# Patient Record
Sex: Female | Born: 1950
Health system: Southern US, Community
[De-identification: ages and names within clinical notes are randomized; demographics above are authoritative.]

## PROBLEM LIST (undated history)

## (undated) DIAGNOSIS — M1990 Unspecified osteoarthritis, unspecified site: Secondary | ICD-10-CM

## (undated) DIAGNOSIS — E785 Hyperlipidemia, unspecified: Secondary | ICD-10-CM

## (undated) DIAGNOSIS — H269 Unspecified cataract: Secondary | ICD-10-CM

## (undated) DIAGNOSIS — H409 Unspecified glaucoma: Secondary | ICD-10-CM

## (undated) DIAGNOSIS — I10 Essential (primary) hypertension: Secondary | ICD-10-CM

## (undated) HISTORY — DX: Unspecified cataract: H26.9

## (undated) HISTORY — PX: EYE SURGERY: SHX253

## (undated) HISTORY — DX: Essential (primary) hypertension: I10

## (undated) HISTORY — DX: Hyperlipidemia, unspecified: E78.5

## (undated) HISTORY — PX: SHOULDER SURGERY: SHX246

---

## 2005-05-07 ENCOUNTER — Ambulatory Visit: Payer: Self-pay | Admitting: Ophthalmology

## 2014-01-31 DIAGNOSIS — H409 Unspecified glaucoma: Secondary | ICD-10-CM | POA: Insufficient documentation

## 2014-04-04 ENCOUNTER — Ambulatory Visit: Payer: Self-pay | Admitting: Unknown Physician Specialty

## 2015-01-17 DIAGNOSIS — M19012 Primary osteoarthritis, left shoulder: Secondary | ICD-10-CM | POA: Insufficient documentation

## 2015-01-18 ENCOUNTER — Other Ambulatory Visit: Payer: Self-pay | Admitting: Surgery

## 2015-01-18 DIAGNOSIS — M19012 Primary osteoarthritis, left shoulder: Secondary | ICD-10-CM

## 2015-01-18 DIAGNOSIS — M7582 Other shoulder lesions, left shoulder: Secondary | ICD-10-CM

## 2015-01-22 ENCOUNTER — Ambulatory Visit: Payer: Self-pay

## 2015-01-23 ENCOUNTER — Ambulatory Visit
Admission: RE | Admit: 2015-01-23 | Discharge: 2015-01-23 | Disposition: A | Discharge status: Home / Self Care | Payer: BC Managed Care – PPO | Source: Ambulatory Visit | Attending: Surgery | Admitting: Surgery

## 2015-01-23 DIAGNOSIS — S46812A Strain of other muscles, fascia and tendons at shoulder and upper arm level, left arm, initial encounter: Secondary | ICD-10-CM | POA: Insufficient documentation

## 2015-01-23 DIAGNOSIS — S43402A Unspecified sprain of left shoulder joint, initial encounter: Secondary | ICD-10-CM | POA: Insufficient documentation

## 2015-01-23 DIAGNOSIS — M12812 Other specific arthropathies, not elsewhere classified, left shoulder: Secondary | ICD-10-CM | POA: Diagnosis not present

## 2015-01-23 DIAGNOSIS — M7582 Other shoulder lesions, left shoulder: Secondary | ICD-10-CM

## 2015-01-23 DIAGNOSIS — M19012 Primary osteoarthritis, left shoulder: Secondary | ICD-10-CM | POA: Diagnosis present

## 2015-01-23 DIAGNOSIS — M659 Synovitis and tenosynovitis, unspecified: Secondary | ICD-10-CM | POA: Insufficient documentation

## 2015-05-01 ENCOUNTER — Other Ambulatory Visit: Payer: BC Managed Care – PPO

## 2015-05-08 ENCOUNTER — Inpatient Hospital Stay: Admission: RE | Admit: 2015-05-08 | Payer: BC Managed Care – PPO | Source: Ambulatory Visit | Admitting: Surgery

## 2015-05-08 ENCOUNTER — Encounter: Admission: RE | Payer: Self-pay | Source: Ambulatory Visit

## 2015-05-08 SURGERY — ARTHROPLASTY, SHOULDER, TOTAL
Anesthesia: Choice | Laterality: Left

## 2015-07-12 ENCOUNTER — Other Ambulatory Visit: Payer: Self-pay | Admitting: Orthopedic Surgery

## 2015-07-12 DIAGNOSIS — M25562 Pain in left knee: Secondary | ICD-10-CM

## 2015-07-12 DIAGNOSIS — M2392 Unspecified internal derangement of left knee: Secondary | ICD-10-CM

## 2015-07-27 ENCOUNTER — Ambulatory Visit
Admission: RE | Admit: 2015-07-27 | Discharge: 2015-07-27 | Disposition: A | Discharge status: Home / Self Care | Payer: BC Managed Care – PPO | Source: Ambulatory Visit | Attending: Orthopedic Surgery | Admitting: Orthopedic Surgery

## 2015-07-27 DIAGNOSIS — M25562 Pain in left knee: Secondary | ICD-10-CM | POA: Diagnosis present

## 2015-07-27 DIAGNOSIS — M7122 Synovial cyst of popliteal space [Baker], left knee: Secondary | ICD-10-CM | POA: Diagnosis not present

## 2015-07-27 DIAGNOSIS — M2392 Unspecified internal derangement of left knee: Secondary | ICD-10-CM

## 2015-09-24 ENCOUNTER — Ambulatory Visit
Admission: RE | Admit: 2015-09-24 | Discharge: 2015-09-24 | Disposition: A | Discharge status: Home / Self Care | Payer: BC Managed Care – PPO | Source: Ambulatory Visit | Attending: Unknown Physician Specialty | Admitting: Unknown Physician Specialty

## 2015-09-24 ENCOUNTER — Other Ambulatory Visit: Payer: Self-pay | Admitting: Unknown Physician Specialty

## 2015-09-24 DIAGNOSIS — M7989 Other specified soft tissue disorders: Secondary | ICD-10-CM | POA: Insufficient documentation

## 2015-09-24 DIAGNOSIS — I82402 Acute embolism and thrombosis of unspecified deep veins of left lower extremity: Secondary | ICD-10-CM

## 2015-09-24 DIAGNOSIS — M7122 Synovial cyst of popliteal space [Baker], left knee: Secondary | ICD-10-CM | POA: Diagnosis not present

## 2015-09-25 ENCOUNTER — Ambulatory Visit
Admission: RE | Admit: 2015-09-25 | Discharge: 2015-09-25 | Disposition: A | Discharge status: Home / Self Care | Payer: BC Managed Care – PPO | Source: Ambulatory Visit | Attending: Unknown Physician Specialty | Admitting: Unknown Physician Specialty

## 2015-09-25 ENCOUNTER — Other Ambulatory Visit: Payer: Self-pay | Admitting: Unknown Physician Specialty

## 2015-09-25 DIAGNOSIS — M7122 Synovial cyst of popliteal space [Baker], left knee: Secondary | ICD-10-CM | POA: Diagnosis not present

## 2015-09-25 DIAGNOSIS — R52 Pain, unspecified: Secondary | ICD-10-CM

## 2015-09-25 DIAGNOSIS — M25462 Effusion, left knee: Secondary | ICD-10-CM | POA: Diagnosis not present

## 2015-09-25 DIAGNOSIS — M7989 Other specified soft tissue disorders: Secondary | ICD-10-CM | POA: Insufficient documentation

## 2015-09-25 DIAGNOSIS — M79662 Pain in left lower leg: Secondary | ICD-10-CM | POA: Insufficient documentation

## 2015-09-25 LAB — POCT I-STAT CREATININE: Creatinine, Ser: 0.8 mg/dL (ref 0.44–1.00)

## 2015-09-25 MED ORDER — GADOBENATE DIMEGLUMINE 529 MG/ML IV SOLN
20.0000 mL | Freq: Once | INTRAVENOUS | Status: AC | PRN
  Administered 2015-09-25: 17 mL via INTRAVENOUS

## 2015-11-29 ENCOUNTER — Other Ambulatory Visit: Payer: Self-pay | Admitting: Unknown Physician Specialty

## 2015-11-29 DIAGNOSIS — M79662 Pain in left lower leg: Secondary | ICD-10-CM

## 2015-11-29 DIAGNOSIS — M7122 Synovial cyst of popliteal space [Baker], left knee: Secondary | ICD-10-CM

## 2015-12-11 ENCOUNTER — Other Ambulatory Visit: Payer: Self-pay | Admitting: General Surgery

## 2015-12-13 ENCOUNTER — Ambulatory Visit
Admission: RE | Admit: 2015-12-13 | Discharge: 2015-12-13 | Disposition: A | Discharge status: Home / Self Care | Payer: BC Managed Care – PPO | Source: Ambulatory Visit | Attending: Unknown Physician Specialty | Admitting: Unknown Physician Specialty

## 2015-12-13 DIAGNOSIS — M79662 Pain in left lower leg: Secondary | ICD-10-CM

## 2015-12-13 DIAGNOSIS — M7122 Synovial cyst of popliteal space [Baker], left knee: Secondary | ICD-10-CM

## 2015-12-13 MED ORDER — METHYLPREDNISOLONE ACETATE 40 MG/ML IJ SUSP
80.0000 mg | Freq: Once | INTRAMUSCULAR | Status: DC
  Filled 2015-12-13: qty 2

## 2015-12-13 MED ORDER — BUPIVACAINE HCL 0.5 % IJ SOLN
50.0000 mL | Freq: Once | INTRAMUSCULAR | Status: DC
  Filled 2015-12-13: qty 50

## 2015-12-13 MED ORDER — SODIUM CHLORIDE 0.9 % IV SOLN: INTRAVENOUS | Status: DC

## 2015-12-13 NOTE — Discharge Instructions (Signed)
Baker Cyst °A Baker cyst is a sac-like structure that forms in the back of the knee. It is filled with the same fluid that is located in your knee. This fluid lubricates the bones and cartilage of the knee and allows them to move over each other more easily. °CAUSES  °When the knee becomes injured or inflamed, increased fluid forms in the knee. When this happens, the joint lining is pushed out behind the knee and forms the Baker cyst. This cyst may also be caused by inflammation from arthritic conditions and infections. °SIGNS AND SYMPTOMS  °A Baker cyst usually has no symptoms. When the cyst is substantially enlarged: °· You may feel pressure behind the knee, stiffness in the knee, or a mass in the area behind the knee. °· You may develop pain, redness, and swelling in the calf.  This can suggest a blood clot and requires evaluation by your health care provider. °DIAGNOSIS  °A Baker cyst is most often found during an ultrasound exam. This exam may have been performed for other reasons, and the cyst was found incidentally. Sometimes an MRI is used. This picks up other problems within a joint that an ultrasound exam may not. If the Baker cyst developed immediately after an injury, X-ray exams may be used to diagnose the cyst. °TREATMENT  °The treatment depends on the cause of the cyst. Anti-inflammatory medicines and rest often will be prescribed. If the cyst is caused by a bacterial infection, antibiotic medicines may be prescribed.  °HOME CARE INSTRUCTIONS  °· If the cyst was caused by an injury, for the first 24 hours, keep the injured leg elevated on 2 pillows while lying down. °· For the first 24 hours while you are awake, apply ice to the injured area: °¨ Put ice in a plastic bag. °¨ Place a towel between your skin and the bag. °¨ Leave the ice on for 20 minutes, 2-3 times a day. °· Only take over-the-counter or prescription medicines for pain, discomfort, or fever as directed by your health care  provider. °· Only take antibiotic medicine as directed. Make sure to finish it even if you start to feel better. °MAKE SURE YOU:  °· Understand these instructions. °· Will watch your condition. °· Will get help right away if you are not doing well or get worse. °  °This information is not intended to replace advice given to you by your health care provider. Make sure you discuss any questions you have with your health care provider. °  °Document Released: 05/19/2005 Document Revised: 03/09/2013 Document Reviewed: 12/29/2012 °Elsevier Interactive Patient Education ©2016 [employer] ° °

## 2015-12-13 NOTE — Procedures (Signed)
Technically successful US guided left posterior calf aspiration and steroid injection.  Aspiration yield @ 20 cc of bloody, viscous fluid.  All aspirated fluid sent to lab for analysis.  No immediate post procedural complications.  Katherina RightJay Siyona Coto, MD Pager #: [phone]

## 2015-12-18 LAB — AEROBIC/ANAEROBIC CULTURE W GRAM STAIN (SURGICAL/DEEP WOUND)

## 2015-12-18 LAB — AEROBIC/ANAEROBIC CULTURE (SURGICAL/DEEP WOUND): CULTURE: NO GROWTH

## 2015-12-27 ENCOUNTER — Other Ambulatory Visit: Payer: Self-pay | Admitting: Unknown Physician Specialty

## 2015-12-27 DIAGNOSIS — S96812A Strain of other specified muscles and tendons at ankle and foot level, left foot, initial encounter: Secondary | ICD-10-CM

## 2015-12-27 DIAGNOSIS — M659 Unspecified synovitis and tenosynovitis, unspecified site: Secondary | ICD-10-CM

## 2015-12-27 DIAGNOSIS — S86112A Strain of other muscle(s) and tendon(s) of posterior muscle group at lower leg level, left leg, initial encounter: Secondary | ICD-10-CM

## 2016-08-26 ENCOUNTER — Other Ambulatory Visit: Payer: Self-pay | Admitting: Rheumatology

## 2016-08-26 DIAGNOSIS — R7989 Other specified abnormal findings of blood chemistry: Secondary | ICD-10-CM

## 2016-08-26 DIAGNOSIS — R945 Abnormal results of liver function studies: Principal | ICD-10-CM

## 2016-09-01 ENCOUNTER — Ambulatory Visit: Payer: BC Managed Care – PPO

## 2016-10-31 DIAGNOSIS — B0052 Herpesviral keratitis: Secondary | ICD-10-CM | POA: Insufficient documentation

## 2017-07-27 ENCOUNTER — Other Ambulatory Visit: Payer: Self-pay | Admitting: Otolaryngology

## 2017-07-27 DIAGNOSIS — H9041 Sensorineural hearing loss, unilateral, right ear, with unrestricted hearing on the contralateral side: Secondary | ICD-10-CM

## 2017-07-27 DIAGNOSIS — H9042 Sensorineural hearing loss, unilateral, left ear, with unrestricted hearing on the contralateral side: Secondary | ICD-10-CM

## 2017-07-27 DIAGNOSIS — IMO0001 Reserved for inherently not codable concepts without codable children: Secondary | ICD-10-CM

## 2017-08-05 ENCOUNTER — Ambulatory Visit
Admission: RE | Admit: 2017-08-05 | Discharge: 2017-08-05 | Disposition: A | Discharge status: Home / Self Care | Payer: BC Managed Care – PPO | Source: Ambulatory Visit | Attending: Otolaryngology | Admitting: Otolaryngology

## 2017-08-05 DIAGNOSIS — H9041 Sensorineural hearing loss, unilateral, right ear, with unrestricted hearing on the contralateral side: Secondary | ICD-10-CM | POA: Diagnosis present

## 2017-08-05 LAB — POCT I-STAT CREATININE: CREATININE: 0.8 mg/dL (ref 0.44–1.00)

## 2017-08-05 MED ORDER — GADOBENATE DIMEGLUMINE 529 MG/ML IV SOLN
15.0000 mL | Freq: Once | INTRAVENOUS | Status: AC | PRN
  Administered 2017-08-05: 15 mL via INTRAVENOUS

## 2018-01-15 ENCOUNTER — Other Ambulatory Visit: Payer: Self-pay | Admitting: Nurse Practitioner

## 2018-01-15 DIAGNOSIS — Z1231 Encounter for screening mammogram for malignant neoplasm of breast: Secondary | ICD-10-CM

## 2018-01-22 ENCOUNTER — Other Ambulatory Visit: Payer: Self-pay

## 2018-01-22 ENCOUNTER — Emergency Department
Admission: EM | Admit: 2018-01-22 | Discharge: 2018-01-22 | Disposition: A | Discharge status: Home / Self Care | Payer: BC Managed Care – PPO | Attending: Emergency Medicine | Admitting: Emergency Medicine

## 2018-01-22 ENCOUNTER — Encounter: Payer: Self-pay | Admitting: Emergency Medicine

## 2018-01-22 DIAGNOSIS — T7840XA Allergy, unspecified, initial encounter: Secondary | ICD-10-CM | POA: Diagnosis present

## 2018-01-22 HISTORY — DX: Unspecified glaucoma: H40.9

## 2018-01-22 HISTORY — DX: Unspecified osteoarthritis, unspecified site: M19.90

## 2018-01-22 MED ORDER — DIPHENHYDRAMINE HCL 50 MG/ML IJ SOLN
25.0000 mg | Freq: Once | INTRAMUSCULAR | Status: AC
  Administered 2018-01-22: 25 mg via INTRAVENOUS
  Filled 2018-01-22: qty 1

## 2018-01-22 MED ORDER — SODIUM CHLORIDE 0.9 % IV SOLN
Freq: Once | INTRAVENOUS | Status: AC
  Administered 2018-01-22: 13:00:00 via INTRAVENOUS

## 2018-01-22 MED ORDER — METHYLPREDNISOLONE SODIUM SUCC 125 MG IJ SOLR
125.0000 mg | Freq: Once | INTRAMUSCULAR | Status: AC
  Administered 2018-01-22: 125 mg via INTRAVENOUS
  Filled 2018-01-22: qty 2

## 2018-01-22 MED ORDER — FAMOTIDINE IN NACL 20-0.9 MG/50ML-% IV SOLN
20.0000 mg | Freq: Once | INTRAVENOUS | Status: AC
  Administered 2018-01-22: 20 mg via INTRAVENOUS
  Filled 2018-01-22: qty 50

## 2018-01-22 NOTE — ED Notes (Signed)
States she feels better  conts to have some swelling to right hand

## 2018-01-22 NOTE — ED Provider Notes (Signed)
[facility] Emergency Department Provider Note       Time seen: ----------------------------------------- 1:22 PM on 01/22/2018 -----------------------------------------   I have reviewed the triage vital signs and the nursing notes.  HISTORY   Chief Complaint Urticaria   HPI Anita Mathews is a 67 y.o. female with a history of arthritis and glaucoma who presents to the ED for hives all over her body.  Patient had onset of her symptoms this morning at 130.  She was given a Solu-Medrol shot and Benadryl earlier this morning.  She was prescribed Atarax and a Medrol Dosepak.  Patient states her hands are red and swollen and itchy.  She has not seen any improvement since this morning.  Last night she drank some ginger beer as well as bourbon.  She felt like this was out of the ordinary for her.  Past Medical History:  Diagnosis Date  . Arthritis   . Glaucoma     There are no active problems to display for this patient.   Past Surgical History:  Procedure Laterality Date  . CESAREAN SECTION    . EYE SURGERY      Allergies Patient has no known allergies.  Social History Social History   Tobacco Use  . Smoking status: Never Smoker  . Smokeless tobacco: Never Used  Substance Use Topics  . Alcohol use: Not on file  . Drug use: Not on file   Review of Systems Constitutional: Negative for fever. Cardiovascular: Negative for chest pain. Respiratory: Negative for shortness of breath. Gastrointestinal: Negative for abdominal pain, vomiting and diarrhea. Musculoskeletal: Negative for back pain. Skin: Positive for red raised skin, itching and hives Neurological: Negative for headaches, focal weakness or numbness.  All systems negative/normal/unremarkable except as stated in the HPI  ____________________________________________   PHYSICAL EXAM:  VITAL SIGNS: ED Triage Vitals  Enc Vitals Group     BP 01/22/18 1124 (!) 126/53     Pulse --       Resp 01/22/18 1123 16     Temp 01/22/18 1123 97.8 F (36.6 C)     Temp Source 01/22/18 1123 Oral     SpO2 01/22/18 1123 97 %     Weight 01/22/18 1124 170 lb (77.1 kg)     Height 01/22/18 1124 5\' 3"  (1.6 m)     Head Circumference --      Peak Flow --      Pain Score 01/22/18 1123 0     Pain Loc --      Pain Edu? --      Excl. in GC? --    Constitutional: Alert and oriented. Well appearing and in no distress. Eyes: Conjunctivae are normal. Normal extraocular movements. ENT   Head: Normocephalic and atraumatic.   Nose: No congestion/rhinnorhea.   Mouth/Throat: Mucous membranes are moist.   Neck: No stridor. Cardiovascular: Normal rate, regular rhythm. No murmurs, rubs, or gallops. Respiratory: Normal respiratory effort without tachypnea nor retractions. Breath sounds are clear and equal bilaterally. No wheezes/rales/rhonchi. Gastrointestinal: Soft and nontender. Normal bowel sounds Musculoskeletal: Nontender with normal range of motion in extremities. No lower extremity tenderness nor edema. Neurologic:  Normal speech and language. No gross focal neurologic deficits are appreciated.  Skin: Scattered urticarial lesions are appreciated particularly on the extremities and in the groin area.  They are markedly raised and erythematous Psychiatric: Mood and affect are normal. Speech and behavior are normal.  ____________________________________________  ED COURSE:  As part of my medical decision making, I  reviewed the following data within the electronic MEDICAL RECORD NUMBER History obtained from family if available, nursing notes, old chart and ekg, as well as notes from prior ED visits. Patient presented for allergic reaction, we will assess with labs and give additional IV fluids, Benadryl and steroids   Procedures ____________________________________________  DIFFERENTIAL DIAGNOSIS   Allergic reaction, urticaria  FINAL ASSESSMENT AND PLAN  Allergic  reaction   Plan: The patient had presented for persistent urticarial lesions. Symptoms were dramatically improved after IV benadryl and additional steroids. She is cleared for outpatient follow up.    Ulice DashJohnathan E Lakysha Kossman, MD   Note: This note was generated in part or whole with voice recognition software. Voice recognition is usually quite accurate but there are transcription errors that can and very often do occur. I apologize for any typographical errors that were not detected and corrected.     Emily FilbertWilliams, Tzipora Mcinroy E, MD 01/22/18 1434

## 2018-01-22 NOTE — ED Triage Notes (Signed)
C/O hives all over body.  Onset of symptoms this morning at 0130.  Seen [facility] and given 125 mg Solumedrol IM at 0830 and last dose of benadryl was at 0630.  Patient prescribed atarax and medrol dosepack..  Patient's hands are red and swollen and itchy.  Patient does not notice any appreciable difference since seen this morning.  No SOB/ DOE.  Red rash seen to arms bilaterally, trunk and groin.

## 2018-01-24 ENCOUNTER — Other Ambulatory Visit: Payer: Self-pay

## 2018-01-24 ENCOUNTER — Emergency Department
Admission: EM | Admit: 2018-01-24 | Discharge: 2018-01-24 | Disposition: A | Discharge status: Home / Self Care | Payer: BC Managed Care – PPO | Attending: Emergency Medicine | Admitting: Emergency Medicine

## 2018-01-24 ENCOUNTER — Emergency Department: Payer: BC Managed Care – PPO

## 2018-01-24 ENCOUNTER — Encounter: Payer: Self-pay | Admitting: *Deleted

## 2018-01-24 DIAGNOSIS — R079 Chest pain, unspecified: Secondary | ICD-10-CM | POA: Diagnosis present

## 2018-01-24 DIAGNOSIS — R1310 Dysphagia, unspecified: Secondary | ICD-10-CM | POA: Diagnosis not present

## 2018-01-24 DIAGNOSIS — R0789 Other chest pain: Secondary | ICD-10-CM

## 2018-01-24 DIAGNOSIS — L509 Urticaria, unspecified: Secondary | ICD-10-CM | POA: Diagnosis not present

## 2018-01-24 LAB — CBC WITH DIFFERENTIAL/PLATELET
Basophils Absolute: 0.1 10*3/uL (ref 0–0.1)
Basophils Relative: 1 %
Eosinophils Absolute: 0.1 10*3/uL (ref 0–0.7)
Eosinophils Relative: 1 %
HEMATOCRIT: 35.3 % (ref 35.0–47.0)
HEMOGLOBIN: 12.1 g/dL (ref 12.0–16.0)
LYMPHS ABS: 2.2 10*3/uL (ref 1.0–3.6)
Lymphocytes Relative: 26 %
MCH: 33.3 pg (ref 26.0–34.0)
MCHC: 34.4 g/dL (ref 32.0–36.0)
MCV: 96.7 fL (ref 80.0–100.0)
MONO ABS: 0.9 10*3/uL (ref 0.2–0.9)
Monocytes Relative: 11 %
NEUTROS ABS: 5.4 10*3/uL (ref 1.4–6.5)
NEUTROS PCT: 61 %
Platelets: 270 10*3/uL (ref 150–440)
RBC: 3.65 MIL/uL — ABNORMAL LOW (ref 3.80–5.20)
RDW: 13.3 % (ref 11.5–14.5)
WBC: 8.7 10*3/uL (ref 3.6–11.0)

## 2018-01-24 LAB — COMPREHENSIVE METABOLIC PANEL
ALK PHOS: 56 U/L (ref 38–126)
ALT: 19 U/L (ref 0–44)
ANION GAP: 6 (ref 5–15)
AST: 27 U/L (ref 15–41)
Albumin: 3.9 g/dL (ref 3.5–5.0)
BUN: 16 mg/dL (ref 8–23)
CO2: 26 mmol/L (ref 22–32)
Calcium: 8.5 mg/dL — ABNORMAL LOW (ref 8.9–10.3)
Chloride: 107 mmol/L (ref 98–111)
Creatinine, Ser: 0.71 mg/dL (ref 0.44–1.00)
GFR calc Af Amer: >60 mL/min (ref >60)
GFR calc non Af Amer: >60 mL/min (ref >60)
Glucose, Bld: 98 mg/dL (ref 70–99)
POTASSIUM: 3.9 mmol/L (ref 3.5–5.1)
SODIUM: 139 mmol/L (ref 135–145)
Total Bilirubin: 0.4 mg/dL (ref 0.3–1.2)
Total Protein: 6.3 g/dL — ABNORMAL LOW (ref 6.5–8.1)

## 2018-01-24 LAB — TROPONIN I
Troponin I: <0.03 ng/mL (ref <0.03)
Troponin I: <0.03 ng/mL (ref <0.03)

## 2018-01-24 LAB — LIPASE, BLOOD: Lipase: 34 U/L (ref 11–51)

## 2018-01-24 MED ORDER — DIPHENHYDRAMINE HCL 50 MG/ML IJ SOLN
12.5000 mg | Freq: Once | INTRAMUSCULAR | Status: AC
  Administered 2018-01-24: 12.5 mg via INTRAVENOUS
  Filled 2018-01-24: qty 1

## 2018-01-24 NOTE — Discharge Instructions (Addendum)
Return to the emergency room for any new or worrisome symptoms, as discussed.  To your food very well be careful taking large bites.  Use the instructions above for guidance on the various illnesses that he presented with you today.  I would start taking Pepcid, over-the-counter as directed.  Follow closely with the above physicians.

## 2018-01-24 NOTE — ED Provider Notes (Addendum)
[facility] Emergency Department Provider Note  ____________________________________________   I have reviewed the triage vital signs and the nursing notes. Where available I have reviewed prior notes and, if possible and indicated, outside hospital notes.    HISTORY  Chief Complaint Chest Pain    HPI Anita Mathews is a 67 y.o. female  Who presents today complaining of having had transitory dysphasia this morning.  Patient states several times a year she has trouble swallowing where it feels like something gets "hung up" in her throat.  She had that sensation this morning drink water, and gradually went away.  She did receive nitroglycerin from EMS but does not feel that that altered any of the feeling of dysphasia.  Patient states that this time she does not feel a foreign body and she feels back to normal.  She states that she has been able to tolerate medications and pills and food.  She states every once in a while though she has a painful swallowing episode.  Patient was recently seen for hives, she was recently started on diclofenac which she has stopped taking, she also only started taking a biotin supplement.  She still taking the supplement.  She has had a great improvement in her hives since she was seen here last week.  At this time she has only 1 hives she can point to.  Is on her right forearm and seems to be getting better.  She had diffuse hives all over her torso which are now gone.  She denies any shortness of breath or angioedema symptoms.  At this time she has no complaints.  Did feel better after burping.  No other associated symptoms no other aggravating or alleviating conditions no other prior treatment no other concerns   Past Medical History:  Diagnosis Date  . Arthritis   . Glaucoma     There are no active problems to display for this patient.   Past Surgical History:  Procedure Laterality Date  . CESAREAN SECTION    . EYE SURGERY       Prior to Admission medications   Medication Sig Start Date End Date Taking? Authorizing Provider  alendronate (FOSAMAX) 70 MG tablet Take 70 mg by mouth once a week. Take with a full glass of water on an empty stomach.    [provider]  diclofenac (VOLTAREN) 75 MG EC tablet Take 75 mg by mouth 2 (two) times daily.    [provider]    Allergies Patient has no known allergies.  History reviewed. No pertinent family history.  Social History Social History   Tobacco Use  . Smoking status: Never Smoker  . Smokeless tobacco: Never Used  Substance Use Topics  . Alcohol use: Yes  . Drug use: Never    Review of Systems Constitutional: No fever/chills Eyes: No visual changes. ENT: No sore throat. No stiff neck no neck pain Cardiovascular: Denies chest pain. Respiratory: Denies shortness of breath. Gastrointestinal:   no vomiting.  No diarrhea.  No constipation. Genitourinary: Negative for dysuria. Musculoskeletal: Negative lower extremity swelling Skin: Negative for rash. Neurological: Negative for severe headaches, focal weakness or numbness.   ____________________________________________   PHYSICAL EXAM:  VITAL SIGNS: ED Triage Vitals  Enc Vitals Group     BP 01/24/18 1033 130/64     Pulse Rate 01/24/18 1033 (!) 59     Resp 01/24/18 1033 16     Temp 01/24/18 1033 97.9 F (36.6 C)  Temp Source 01/24/18 1033 Oral     SpO2 01/24/18 1033 100 %     Weight 01/24/18 1035 172 lb (78 kg)     Height 01/24/18 1035 5\' 3"  (1.6 m)     Head Circumference --      Peak Flow --      Pain Score 01/24/18 1034 2     Pain Loc --      Pain Edu? --      Excl. in GC? --     Constitutional: Alert and oriented. Well appearing and in no acute distress. Eyes: Conjunctivae are normal Head: Atraumatic HEENT: No congestion/rhinnorhea. Mucous membranes are moist.  Oropharynx non-erythematous Neck:   Nontender with no meningismus, no masses, no  stridor Cardiovascular: Normal rate, regular rhythm. Grossly normal heart sounds.  Good peripheral circulation. Respiratory: Normal respiratory effort.  No retractions. Lungs CTAB. Abdominal: Soft and nontender. No distention. No guarding no rebound Back:  There is no focal tenderness or step off.  there is no midline tenderness there are no lesions noted. there is no CVA tenderness  Musculoskeletal: No lower extremity tenderness, no upper extremity tenderness. No joint effusions, no DVT signs strong distal pulses no edema Neurologic:  Normal speech and language. No gross focal neurologic deficits are appreciated.  Skin:  Skin is warm, dry and intact. No rash noted. Psychiatric: Mood and affect are anxious. Speech and behavior are normal.  ____________________________________________   LABS (all labs ordered are listed, but only abnormal results are displayed)  Labs Reviewed  COMPREHENSIVE METABOLIC PANEL - Abnormal; Notable for the following components:      Result Value   Calcium 8.5 (*)    Total Protein 6.3 (*)    All other components within normal limits  CBC WITH DIFFERENTIAL/PLATELET - Abnormal; Notable for the following components:   RBC 3.65 (*)    All other components within normal limits  LIPASE, BLOOD  TROPONIN I    Pertinent labs  results that were available during my care of the patient were reviewed by me and considered in my medical decision making (see chart for details). ____________________________________________  EKG  I personally interpreted any EKGs ordered by me or triage Sinus rhythm, rate 57 bpm, bradycardia noted, no acute ST elevation or depression, nonspecific ST changes no acute ischemia. ____________________________________________  RADIOLOGY  Pertinent labs & imaging results that were available during my care of the patient were reviewed by me and considered in my medical decision making (see chart for details). If possible, patient and/or family  made aware of any abnormal findings.  Dg Chest 2 View  Result Date: 01/24/2018 CLINICAL DATA:  Chest pain EXAM: CHEST - 2 VIEW COMPARISON:  None. FINDINGS: Normal heart size. Normal mediastinal contour. No pneumothorax. No pleural effusion. Minimal scarring versus atelectasis at the lung bases. No pulmonary edema. No acute consolidative airspace disease. IMPRESSION: Minimal bibasilar scarring versus atelectasis. Electronically Signed   By: Delbert Phenix M.D.   On: 01/24/2018 11:05   ____________________________________________    PROCEDURES  Procedure(s) performed: None  Procedures  Critical Care performed: None  ____________________________________________   INITIAL IMPRESSION / ASSESSMENT AND PLAN / ED COURSE  Pertinent labs & imaging results that were available during my care of the patient were reviewed by me and considered in my medical decision making (see chart for details).  Patient here with a transitory dysphasia, it was associated with some burping has had similar in the past.  She has a resolving hive also on her  right forearm.  I advised her to stop taking the biotin, it sounds as if her urticaria is almost completely resolved since her last visit.  It is possible she had a urticarial event in her esophagus which is not precedent it but it was very rapidly resolving what ever it was and she has a long history of dysphasia in the past.  We will make sure she can tolerate p.o., given her history we will check cardiac enzymes although this does not sound like a cardiac event, abdomen is benign, there is no evidence of Boerhaave's there is no evidence of any other acute pathology seen on chest x-ray do not think this represents ACS PE dissection myocarditis endocarditis pericarditis pneumonia pneumothorax or any other acute intrathoracic or intra-abdominal pathology at this time.  If patient can tolerate p.o. well and her enzymes are negative, it is my hope that we can get her safely  home with close outpatient follow-up with GI.  We will ensure that she can swallow prior to discharge.  Also start her on an H2 blocker which may help with her slightly residual hives and also diminish esophageal irritation if that is what is causing this.  Patient is very comfortable with this plan as is her daughter at bedside.  ----------------------------------------- 12:19 PM on 01/24/2018 -----------------------------------------  Reassuring thus far we will try p.o. challenge, family of come to the nurses station several times because of concern for the isolated hive on her arm which is itchy, no evidence of anaphylaxis.  Is a isolated hive.  We will give her Benadryl.  Otherwise asymptomatic at this time   ----------------------------------------- 2:23 PM on 01/24/2018 -----------------------------------------  Besides the one hive which is somewhat diminished after Benadryl, there is no complaint while she is here.  No pain or shortness of breath she is eating and drinking with no difficulty, no evidence of esophageal obstruction  At this time, there does not appear to be clinical evidence to support the diagnosis of pulmonary embolus, dissection, myocarditis, endocarditis, pericarditis, pericardial tamponade, acute coronary syndrome, pneumothorax, pneumonia, or any other acute intrathoracic pathology that will require admission or acute intervention. Nor is there evidence of any significant intra-abdominal pathology causing this discomfort.  Considering the patient's symptoms, medical history, and physical examination today, I have low suspicion for cholecystitis or biliary pathology, pancreatitis, perforation or bowel obstruction, hernia, intra-abdominal abscess, AAA or dissection, volvulus or intussusception, mesenteric ischemia, ischemic gut, pyelonephritis or appendicitis.  Patient will be referred to GI and cardiology as a precaution return precautions follow-up given and understood  patient in no acute distress, I have advised her to stop taking the biotin which is the only new medication which could be likely causing her hives, otherwise she will need to follow-up with an allergist.  No indication for systemic steroids, she will use topical medications at this time no evidence of anaphylaxis.     ____________________________________________   FINAL CLINICAL IMPRESSION(S) / ED DIAGNOSES  Final diagnoses:  None      This chart was dictated using voice recognition software.  Despite best efforts to proofread,  errors can occur which can change meaning.      Jeanmarie PlantMcShane, Lorah Kalina A, MD 01/24/18 1143    Jeanmarie PlantMcShane, Jowan Skillin A, MD 01/24/18 1220    Jeanmarie PlantMcShane, Daune Divirgilio A, MD 01/24/18 [phone]

## 2018-01-24 NOTE — ED Triage Notes (Signed)
Patient arrived via ems c/o  Chest pressure in chest  Sensation of belching  Last night and difficulty swallowing this am Given 324 mg asa and 1 nitro by ems  Pta. Chest pressure is better now  No history of heart disease or swallowing before Seen 2 days ago for hives  - still has hives

## 2018-01-24 NOTE — ED Notes (Signed)
Patient transported to X-ray 

## 2018-01-24 NOTE — ED Notes (Signed)
Pt given ice pack per request. 

## 2018-01-26 ENCOUNTER — Ambulatory Visit
Admission: RE | Admit: 2018-01-26 | Discharge: 2018-01-26 | Disposition: A | Discharge status: Home / Self Care | Payer: BC Managed Care – PPO | Source: Ambulatory Visit | Attending: Nurse Practitioner | Admitting: Nurse Practitioner

## 2018-01-26 DIAGNOSIS — Z1231 Encounter for screening mammogram for malignant neoplasm of breast: Secondary | ICD-10-CM

## 2018-07-21 DIAGNOSIS — H04129 Dry eye syndrome of unspecified lacrimal gland: Secondary | ICD-10-CM | POA: Insufficient documentation

## 2019-01-20 ENCOUNTER — Other Ambulatory Visit: Payer: Self-pay | Admitting: Nurse Practitioner

## 2019-01-20 DIAGNOSIS — Z1231 Encounter for screening mammogram for malignant neoplasm of breast: Secondary | ICD-10-CM

## 2019-03-09 ENCOUNTER — Ambulatory Visit: Payer: BC Managed Care – PPO

## 2019-03-29 ENCOUNTER — Ambulatory Visit
Admission: RE | Admit: 2019-03-29 | Discharge: 2019-03-29 | Disposition: A | Discharge status: Home / Self Care | Payer: Medicare Other | Source: Ambulatory Visit | Attending: Nurse Practitioner | Admitting: Nurse Practitioner

## 2019-03-29 ENCOUNTER — Other Ambulatory Visit: Payer: Self-pay

## 2019-03-29 DIAGNOSIS — Z1231 Encounter for screening mammogram for malignant neoplasm of breast: Secondary | ICD-10-CM | POA: Insufficient documentation

## 2019-04-08 LAB — HM COLONOSCOPY

## 2019-05-09 ENCOUNTER — Other Ambulatory Visit: Payer: Self-pay | Admitting: Surgery

## 2019-05-09 DIAGNOSIS — M19012 Primary osteoarthritis, left shoulder: Secondary | ICD-10-CM

## 2019-05-24 ENCOUNTER — Ambulatory Visit
Admission: RE | Admit: 2019-05-24 | Discharge: 2019-05-24 | Disposition: A | Discharge status: Home / Self Care | Payer: Medicare Other | Source: Ambulatory Visit | Attending: Surgery | Admitting: Surgery

## 2019-05-24 ENCOUNTER — Other Ambulatory Visit: Payer: Self-pay

## 2019-05-24 DIAGNOSIS — M19012 Primary osteoarthritis, left shoulder: Secondary | ICD-10-CM | POA: Diagnosis not present

## 2020-02-02 ENCOUNTER — Other Ambulatory Visit: Payer: Self-pay | Admitting: Nurse Practitioner

## 2020-02-02 DIAGNOSIS — Z1231 Encounter for screening mammogram for malignant neoplasm of breast: Secondary | ICD-10-CM

## 2020-04-09 LAB — HM DEXA SCAN

## 2020-05-08 ENCOUNTER — Inpatient Hospital Stay: Admission: RE | Admit: 2020-05-08 | Payer: Medicare Other | Source: Ambulatory Visit

## 2020-06-26 ENCOUNTER — Ambulatory Visit: Payer: Medicare Other

## 2020-07-15 LAB — HM PAP SMEAR: HM Pap smear: NORMAL

## 2020-07-15 LAB — HM MAMMOGRAPHY

## 2020-07-18 ENCOUNTER — Ambulatory Visit
Admission: RE | Admit: 2020-07-18 | Discharge: 2020-07-18 | Disposition: A | Discharge status: Home / Self Care | Payer: Medicare Other | Source: Ambulatory Visit | Attending: Nurse Practitioner | Admitting: Nurse Practitioner

## 2020-07-18 ENCOUNTER — Other Ambulatory Visit: Payer: Self-pay

## 2020-07-18 ENCOUNTER — Ambulatory Visit
Admission: EM | Admit: 2020-07-18 | Discharge: 2020-07-18 | Disposition: A | Discharge status: Home / Self Care | Payer: Medicare Other

## 2020-07-18 DIAGNOSIS — Z1231 Encounter for screening mammogram for malignant neoplasm of breast: Secondary | ICD-10-CM | POA: Diagnosis present

## 2020-07-18 DIAGNOSIS — R030 Elevated blood-pressure reading, without diagnosis of hypertension: Secondary | ICD-10-CM

## 2020-07-18 NOTE — Discharge Instructions (Addendum)
Continue to increase your aerobic exercise with a goal of 30 minutes of exercise 3-4 times a week.  Keep your salt intake to under 2000 mg daily.  Increase your water intake with a goal of eight 8 ounce glasses daily.  Take your blood pressure daily and record the number.  Do not take your blood pressure first thing in the morning.  Also do not take your blood pressure within 30 minutes to an hour of drinking coffee.  When you take your blood pressure you need to be sitting in a relaxed state for at least 30 minutes.  When you take your blood pressure make sure you are sitting with your feet flat on the floor, your bladder empty, and the arm you are taking the measurement and at heart level for at least 5 minutes.  Take the measurement and record.  Follow-up with your primary care provider.

## 2020-07-18 NOTE — ED Provider Notes (Signed)
[facility]    CSN: [id number] Arrival date & time: 07/18/20  1228      History   Chief Complaint Chief Complaint  Patient presents with  . Hypertension    HPI Anita Mathews is a 70 y.o. female.   HPI   70 year old female here for evaluation of elevated blood pressure.  Patient reports that yesterday while she was at her GYNs office for her Pap smear her blood pressure was 150/80.  She states while driving home her head felt "big" and she called EMS who checked her blood pressure and it was 184/74.  Patient states that she was able to get in with her PCP so she came here for evaluation.  Patient also reports that she has had an intermittent stiff neck that is not there presently and she had some right lower jaw pain last night that resolved when she went to bed and put pressure on her jaw.  Patient denies any history of hypertension any chest pain, shortness of breath, room spinning, being off balance, headache, nausea, or sweats.  Patient's mother does have a history of hypertension.  Patient does occasionally drink alcohol and she is a non-smoker.  Patient does report that she recently started back exercising.  Past Medical History:  Diagnosis Date  . Arthritis   . Glaucoma     There are no problems to display for this patient.   Past Surgical History:  Procedure Laterality Date  . CESAREAN SECTION    . EYE SURGERY      OB History   No obstetric history on file.      Home Medications    Prior to Admission medications   Medication Sig Start Date End Date Taking? Authorizing Provider  alendronate (FOSAMAX) 70 MG tablet Take 70 mg by mouth once a week. Take with a full glass of water on an empty stomach.   Yes [provider]  dorzolamide-timolol (COSOPT) 22.3-6.8 MG/ML ophthalmic solution Apply to eye. 04/18/19  Yes [provider]  rosuvastatin (CRESTOR) 5 MG tablet Take 5 mg by mouth daily. 04/15/20  Yes [provider]   Secukinumab (COSENTYX) 150 MG/ML SOSY Inject into the skin.   Yes [provider]  diclofenac (VOLTAREN) 75 MG EC tablet Take 75 mg by mouth 2 (two) times daily.    [provider]    Family History Family History  Problem Relation Age of Onset  . Breast cancer Mother 47  . Breast cancer Paternal Grandmother     Social History Social History   Tobacco Use  . Smoking status: Never Smoker  . Smokeless tobacco: Never Used  Substance Use Topics  . Alcohol use: Yes  . Drug use: Never     Allergies   Patient has no known allergies.   Review of Systems Review of Systems  Constitutional: Negative for diaphoresis.  Respiratory: Negative for shortness of breath and wheezing.   Cardiovascular: Negative for chest pain.  Gastrointestinal: Negative for nausea.  Neurological: Positive for dizziness. Negative for light-headedness and headaches.     Physical Exam Triage Vital Signs ED Triage Vitals  Enc Vitals Group     BP 07/18/20 1353 (!) 152/68     Pulse Rate 07/18/20 1353 62     Resp 07/18/20 1353 18     Temp 07/18/20 1353 98 F (36.7 C)     Temp Source 07/18/20 1353 Oral     SpO2 07/18/20 1353 100 %     Weight 07/18/20  1350 188 lb (85.3 kg)     Height 07/18/20 1350 5\' 4"  (1.626 m)     Head Circumference --      Peak Flow --      Pain Score 07/18/20 1350 0     Pain Loc --      Pain Edu? --      Excl. in GC? --    No data found.  Updated Vital Signs BP (!) 152/68 (BP Location: Right Arm)   Pulse 62   Temp 98 F (36.7 C) (Oral)   Resp 18   Ht 5\' 4"  (1.626 m)   Wt 188 lb (85.3 kg)   SpO2 100%   BMI 32.27 kg/m   Visual Acuity Right Eye Distance:   Left Eye Distance:   Bilateral Distance:    Right Eye Near:   Left Eye Near:    Bilateral Near:     Physical Exam Vitals and nursing note reviewed.  Constitutional:      General: She is not in acute distress.    Appearance: Normal appearance. She is normal weight. She is not  ill-appearing.  HENT:     Head: Normocephalic.  Cardiovascular:     Rate and Rhythm: Normal rate and regular rhythm.     Pulses: Normal pulses.     Heart sounds: Normal heart sounds. No murmur heard. No gallop.   Pulmonary:     Effort: Pulmonary effort is normal.     Breath sounds: Normal breath sounds. No wheezing, rhonchi or rales.  Skin:    General: Skin is warm and dry.     Capillary Refill: Capillary refill takes less than 2 seconds.     Findings: No erythema.  Neurological:     General: No focal deficit present.     Mental Status: She is alert.  Psychiatric:        Mood and Affect: Mood normal.        Behavior: Behavior normal.        Thought Content: Thought content normal.        Judgment: Judgment normal.      UC Treatments / Results  Labs (all labs ordered are listed, but only abnormal results are displayed) Labs Reviewed - No data to display  EKG   Radiology No results found.  Procedures Procedures (including critical care time)  Medications Ordered in UC Medications - No data to display  Initial Impression / Assessment and Plan / UC Course  I have reviewed the triage vital signs and the nursing notes.  Pertinent labs & imaging results that were available during my care of the patient were reviewed by me and considered in my medical decision making (see chart for details).   Patient is here for evaluation of elevated blood pressure without any associated symptoms.  She states that she first noticed this yesterday while she was at her GYN for a Pap smear.  She states she does not have any history of hypertension.  Patient had no chest pain, shortness of breath, headache, or nausea.  Patient states that her head feels big and she felt flushed but does not describe any room spinning or being off balance sensations.  Patient's neuro status is intact with cranial nerves II through XII being grossly intact.  Heart sounds are S1-S2 and crisp and lung sounds are  clear to auscultation all fields.  Discussed with patient that her blood pressure being 152/68, while being elevated, is not high enough to be started on  medication for.  Encourage patient to get a blood pressure cuff and monitor her BP at home for a period of 2 weeks and then discussed the findings with her PCP.  I advised patient that in the meantime she should continue lifestyle modifications of monitoring salt, increasing exercise, increasing water intake, and following a Mediterranean diet.   Final Clinical Impressions(s) / UC Diagnoses   Final diagnoses:  Elevated blood pressure reading     Discharge Instructions     Continue to increase your aerobic exercise with a goal of 30 minutes of exercise 3-4 times a week.  Keep your salt intake to under 2000 mg daily.  Increase your water intake with a goal of eight 8 ounce glasses daily.  Take your blood pressure daily and record the number.  Do not take your blood pressure first thing in the morning.  Also do not take your blood pressure within 30 minutes to an hour of drinking coffee.  When you take your blood pressure you need to be sitting in a relaxed state for at least 30 minutes.  When you take your blood pressure make sure you are sitting with your feet flat on the floor, your bladder empty, and the arm you are taking the measurement and at heart level for at least 5 minutes.  Take the measurement and record.  Follow-up with your primary care provider.    ED Prescriptions    None     PDMP not reviewed this encounter.   Becky Augusta, NP 07/18/20 [phone]

## 2020-07-18 NOTE — ED Triage Notes (Addendum)
Patient states that she has been having an elevated BP visit while at a doctors office for a pap smear, BP was 150/80 then. States that while driving home she was dizzy and called EMS twice this morning states that they came and took her BP (184/74). States that she couldn't get in with her PCP. States that last night she had jaw pain and stiff neck, but these have resolved. States that BP is typically 120/60. Reports that she is not currently on any medications for blood pressure.

## 2020-10-04 ENCOUNTER — Ambulatory Visit: Payer: Self-pay | Admitting: Family Medicine

## 2020-12-12 ENCOUNTER — Ambulatory Visit
Admission: EM | Admit: 2020-12-12 | Discharge: 2020-12-12 | Disposition: A | Discharge status: Home / Self Care | Payer: Medicare Other | Attending: Family Medicine | Admitting: Family Medicine

## 2020-12-12 ENCOUNTER — Ambulatory Visit (INDEPENDENT_AMBULATORY_CARE_PROVIDER_SITE_OTHER): Payer: Medicare Other

## 2020-12-12 ENCOUNTER — Other Ambulatory Visit: Payer: Self-pay

## 2020-12-12 DIAGNOSIS — R071 Chest pain on breathing: Secondary | ICD-10-CM

## 2020-12-12 DIAGNOSIS — M898X1 Other specified disorders of bone, shoulder: Secondary | ICD-10-CM | POA: Diagnosis not present

## 2020-12-12 MED ORDER — BACLOFEN 10 MG PO TABS: 5.0000 mg | ORAL_TABLET | Freq: Three times a day (TID) | ORAL | 0 refills | Status: DC | PRN

## 2020-12-12 NOTE — ED Triage Notes (Signed)
Pt sts she was dx with bronchitis last Thursday and since then when taking a deep breath she have pain in L shoulder.

## 2020-12-12 NOTE — Discharge Instructions (Addendum)
Rest.  Heat.  Stretch the area.  Medication as directed.  Take care  Dr. Adriana Simas

## 2020-12-12 NOTE — ED Provider Notes (Signed)
[facility]    CSN: [id number] Arrival date & time: 12/12/20  1043      History   Chief Complaint Chief Complaint  Patient presents with   Shoulder Pain   HPI  70 year old female presents with pain around her left scapula.  Started approximately 5 days ago.  Intermittent.  Is now interfering with sleep.  Makes it difficult for her to get comfortable in bed.  Pain 4/10 in severity.  She has had no relief with over-the-counter treatment.  No fall, trauma, injury.  Patient reports that she did recently have acute bronchitis.  She is unsure if this is contributing.  She states that she has had pain particularly when taking a deep breath.  No fever.  No other associated symptoms.  No other complaints.  Past Medical History:  Diagnosis Date   Arthritis    Glaucoma    Past Surgical History:  Procedure Laterality Date   CESAREAN SECTION     EYE SURGERY     SHOULDER SURGERY Left     OB History   No obstetric history on file.      Home Medications    Prior to Admission medications   Medication Sig Start Date End Date Taking? Authorizing Provider  baclofen (LIORESAL) 10 MG tablet Take 0.5-1 tablets (5-10 mg total) by mouth 3 (three) times daily as needed for muscle spasms (Pain, scapula). 12/12/20  Yes Emaya Preston G, DO  alendronate (FOSAMAX) 70 MG tablet Take 70 mg by mouth once a week. Take with a full glass of water on an empty stomach.    [provider]  diclofenac (VOLTAREN) 75 MG EC tablet Take 75 mg by mouth 2 (two) times daily.    [provider]  dorzolamide-timolol (COSOPT) 22.3-6.8 MG/ML ophthalmic solution Apply to eye. 04/18/19   [provider]  rosuvastatin (CRESTOR) 5 MG tablet Take 5 mg by mouth daily. 04/15/20   [provider]  Secukinumab (COSENTYX) 150 MG/ML SOSY Inject into the skin.    [provider]    Family History Family History  Problem Relation Age of Onset   Breast cancer Mother 83    Breast cancer Paternal Grandmother     Social History Social History   Tobacco Use   Smoking status: Never   Smokeless tobacco: Never  Substance Use Topics   Alcohol use: Yes   Drug use: Never     Allergies   Patient has no known allergies.   Review of Systems Review of Systems Per HPI  Physical Exam Triage Vital Signs ED Triage Vitals [12/12/20 1138]  Enc Vitals Group     BP 139/74     Pulse Rate (!) 50     Resp 16     Temp 98.1 F (36.7 C)     Temp Source Oral     SpO2 99 %     Weight 174 lb (78.9 kg)     Height 5' 3.5" (1.613 m)     Head Circumference      Peak Flow      Pain Score 4     Pain Loc      Pain Edu?      Excl. in GC?    No data found.  Updated Vital Signs BP 139/74   Pulse (!) 50   Temp 98.1 F (36.7 C) (Oral)   Resp 16   Ht 5' 3.5" (1.613 m)   Wt 78.9 kg   SpO2 99%   BMI  30.34 kg/m   Visual Acuity Right Eye Distance:   Left Eye Distance:   Bilateral Distance:    Right Eye Near:   Left Eye Near:    Bilateral Near:     Physical Exam Constitutional:      General: She is not in acute distress.    Appearance: Normal appearance.  HENT:     Head: Normocephalic and atraumatic.  Eyes:     General:        Right eye: No discharge.        Left eye: No discharge.     Conjunctiva/sclera: Conjunctivae normal.  Cardiovascular:     Rate and Rhythm: Regular rhythm. Bradycardia present.  Pulmonary:     Effort: Pulmonary effort is normal.     Breath sounds: Normal breath sounds. No wheezing, rhonchi or rales.  Musculoskeletal:     Comments: Slight tenderness around the medial aspect of the left scapula.  Neurological:     Mental Status: She is alert.  Psychiatric:        Mood and Affect: Mood normal.        Behavior: Behavior normal.    UC Treatments / Results  Labs (all labs ordered are listed, but only abnormal results are displayed) Labs Reviewed - No data to display  EKG Interpretation: Sinus bradycardia at the rate of  46.  Normal axis.  No ST-T wave changes.  No evidence of heart block.  Radiology DG Chest 2 View  Result Date: 12/12/2020 CLINICAL DATA:  Chest pain with inspiration. EXAM: CHEST - 2 VIEW COMPARISON:  01/24/2018 FINDINGS: The lungs are clear without focal pneumonia, edema, pneumothorax or pleural effusion. Hazy opacity in the right cardiophrenic angle is stable, presumably juxta cardiac fat pad given the long-term chronicity. Chronic atelectasis or scarring noted left base. The cardiopericardial silhouette is within normal limits for size. Bones are diffusely demineralized. Status post left shoulder replacement. IMPRESSION: No active cardiopulmonary disease. Electronically Signed   By: Kennith Center M.D.   On: 12/12/2020 12:38    Procedures Procedures (including critical care time)  Medications Ordered in UC Medications - No data to display  Initial Impression / Assessment and Plan / UC Course  I have reviewed the triage vital signs and the nursing notes.  Pertinent labs & imaging results that were available during my care of the patient were reviewed by me and considered in my medical decision making (see chart for details).    70 year old female presents with pain around the left scapula particular on the medial aspect.  I believe that this is muscular.  EKG was unremarkable.  Chest x-ray was obtained and was independently reviewed by me.  Chest x-ray was clear.  Baclofen as directed.  Supportive care.  Final Clinical Impressions(s) / UC Diagnoses   Final diagnoses:  Pain of left scapula     Discharge Instructions      Rest.  Heat.  Stretch the area.  Medication as directed.  Take care  Dr. Adriana Simas    ED Prescriptions     Medication Sig Dispense Auth. Provider   baclofen (LIORESAL) 10 MG tablet Take 0.5-1 tablets (5-10 mg total) by mouth 3 (three) times daily as needed for muscle spasms (Pain, scapula). 20 each Tommie Sams, DO      PDMP not reviewed this  encounter.   Tommie Sams, [place] 12/12/20 1656

## 2021-02-07 ENCOUNTER — Ambulatory Visit (INDEPENDENT_AMBULATORY_CARE_PROVIDER_SITE_OTHER): Payer: Medicare Other | Admitting: Internal Medicine

## 2021-02-07 ENCOUNTER — Encounter: Payer: Self-pay | Admitting: Internal Medicine

## 2021-02-07 ENCOUNTER — Other Ambulatory Visit: Payer: Self-pay

## 2021-02-07 VITALS — BP 140/51 | HR 55 | Temp 98.1°F | Resp 17 | Ht 62.5 in | Wt 175.0 lb

## 2021-02-07 DIAGNOSIS — I83813 Varicose veins of bilateral lower extremities with pain: Secondary | ICD-10-CM | POA: Insufficient documentation

## 2021-02-07 DIAGNOSIS — E782 Mixed hyperlipidemia: Secondary | ICD-10-CM | POA: Diagnosis not present

## 2021-02-07 DIAGNOSIS — E663 Overweight: Secondary | ICD-10-CM | POA: Insufficient documentation

## 2021-02-07 DIAGNOSIS — Z6830 Body mass index (BMI) 30.0-30.9, adult: Secondary | ICD-10-CM

## 2021-02-07 DIAGNOSIS — M810 Age-related osteoporosis without current pathological fracture: Secondary | ICD-10-CM | POA: Diagnosis not present

## 2021-02-07 DIAGNOSIS — L4050 Arthropathic psoriasis, unspecified: Secondary | ICD-10-CM | POA: Diagnosis not present

## 2021-02-07 DIAGNOSIS — E785 Hyperlipidemia, unspecified: Secondary | ICD-10-CM | POA: Insufficient documentation

## 2021-02-07 DIAGNOSIS — M8949 Other hypertrophic osteoarthropathy, multiple sites: Secondary | ICD-10-CM | POA: Diagnosis not present

## 2021-02-07 DIAGNOSIS — M159 Polyosteoarthritis, unspecified: Secondary | ICD-10-CM

## 2021-02-07 DIAGNOSIS — Z6828 Body mass index (BMI) 28.0-28.9, adult: Secondary | ICD-10-CM | POA: Insufficient documentation

## 2021-02-07 DIAGNOSIS — Z6827 Body mass index (BMI) 27.0-27.9, adult: Secondary | ICD-10-CM | POA: Insufficient documentation

## 2021-02-07 DIAGNOSIS — M1990 Unspecified osteoarthritis, unspecified site: Secondary | ICD-10-CM | POA: Insufficient documentation

## 2021-02-07 DIAGNOSIS — E6609 Other obesity due to excess calories: Secondary | ICD-10-CM

## 2021-02-07 NOTE — Assessment & Plan Note (Signed)
Encouraged diet and exercise for weight loss ?

## 2021-02-07 NOTE — Assessment & Plan Note (Signed)
Continue Alendronate, Calcium and Vit D Encouraged daily weight bearing exercise

## 2021-02-07 NOTE — Patient Instructions (Signed)
Heart-Healthy Eating Plan Heart-healthy meal planning includes: Eating less unhealthy fats. Eating more healthy fats. Making other changes in your diet. Talk with your doctor or a diet specialist (dietitian) to create an eating plan that is right for you. What is my plan? Your doctor may recommend an eating plan that includes: Total fat: ______% or less of total calories a day. Saturated fat: ______% or less of total calories a day. Cholesterol: less than _________mg a day. What are tips for following this plan? Cooking Avoid frying your food. Try to bake, boil, grill, or broil it instead. You can also reduce fat by: Removing the skin from poultry. Removing all visible fats from meats. Steaming vegetables in water or broth. Meal planning  At meals, divide your plate into four equal parts: Fill one-half of your plate with vegetables and green salads. Fill one-fourth of your plate with whole grains. Fill one-fourth of your plate with lean protein foods. Eat 4-5 servings of vegetables per day. A serving of vegetables is: 1 cup of raw or cooked vegetables. 2 cups of raw leafy greens. Eat 4-5 servings of fruit per day. A serving of fruit is: 1 medium whole fruit.  cup of dried fruit.  cup of fresh, frozen, or canned fruit.  cup of 100% fruit juice. Eat more foods that have soluble fiber. These are apples, broccoli, carrots, beans, peas, and barley. Try to get 20-30 g of fiber per day. Eat 4-5 servings of nuts, legumes, and seeds per week: 1 serving of dried beans or legumes equals  cup after being cooked. 1 serving of nuts is  cup. 1 serving of seeds equals 1 tablespoon. General information Eat more home-cooked food. Eat less restaurant, buffet, and fast food. Limit or avoid alcohol. Limit foods that are high in starch and sugar. Avoid fried foods. Lose weight if you are overweight. Keep track of how much salt (sodium) you eat. This is important if you have high blood  pressure. Ask your doctor to tell you more about this. Try to add vegetarian meals each week. Fats Choose healthy fats. These include olive oil and canola oil, flaxseeds, walnuts, almonds, and seeds. Eat more omega-3 fats. These include salmon, mackerel, sardines, tuna, flaxseed oil, and ground flaxseeds. Try to eat fish at least 2 times each week. Check food labels. Avoid foods with trans fats or high amounts of saturated fat. Limit saturated fats. These are often found in animal products, such as meats, butter, and cream. These are also found in plant foods, such as palm oil, palm kernel oil, and coconut oil. Avoid foods with partially hydrogenated oils in them. These have trans fats. Examples are stick margarine, some tub margarines, cookies, crackers, and other baked goods. What foods can I eat? Fruits All fresh, canned (in natural juice), or frozen fruits. Vegetables Fresh or frozen vegetables (raw, steamed, roasted, or grilled). Green salads. Grains Most grains. Choose whole wheat and whole grains most of the time. Rice and pasta, including brown rice and pastas made with whole wheat. Meats and other proteins Lean, well-trimmed beef, veal, pork, and lamb. Chicken and turkey without skin. All fish and shellfish. Wild duck, rabbit, pheasant, and venison. Egg whites or low-cholesterol egg substitutes. Dried beans, peas, lentils, and tofu. Seeds and most nuts. Dairy Low-fat or nonfat cheeses, including ricotta and mozzarella. Skim or 1% milk that is liquid, powdered, or evaporated. Buttermilk that is made with low-fat milk. Nonfat or low-fat yogurt. Fats and oils Non-hydrogenated (trans-free) margarines. Vegetable oils, including   soybean, sesame, sunflower, olive, peanut, safflower, corn, canola, and cottonseed. Salad dressings or mayonnaise made with a vegetable oil. Beverages Mineral water. Coffee and tea. Diet carbonated beverages. Sweets and desserts Sherbet, gelatin, and fruit ice.  Small amounts of dark chocolate. Limit all sweets and desserts. Seasonings and condiments All seasonings and condiments. The items listed above may not be a complete list of foods and drinks you can eat. Contact a dietitian for more options. What foods should I avoid? Fruits Canned fruit in heavy syrup. Fruit in cream or butter sauce. Fried fruit. Limit coconut. Vegetables Vegetables cooked in cheese, cream, or butter sauce. Fried vegetables. Grains Breads that are made with saturated or trans fats, oils, or whole milk. Croissants. Sweet rolls. Donuts. High-fat crackers, such as cheese crackers. Meats and other proteins Fatty meats, such as hot dogs, ribs, sausage, bacon, rib-eye roast or steak. High-fat deli meats, such as salami and bologna. Caviar. Domestic duck and goose. Organ meats, such as liver. Dairy Cream, sour cream, cream cheese, and creamed cottage cheese. Whole-milk cheeses. Whole or 2% milk that is liquid, evaporated, or condensed. Whole buttermilk. Cream sauce or high-fat cheese sauce. Yogurt that is made from whole milk. Fats and oils Meat fat, or shortening. Cocoa butter, hydrogenated oils, palm oil, coconut oil, palm kernel oil. Solid fats and shortenings, including bacon fat, salt pork, lard, and butter. Nondairy cream substitutes. Salad dressings with cheese or sour cream. Beverages Regular sodas and juice drinks with added sugar. Sweets and desserts Frosting. Pudding. Cookies. Cakes. Pies. Milk chocolate or white chocolate. Buttered syrups. Full-fat ice cream or ice cream drinks. The items listed above may not be a complete list of foods and drinks to avoid. Contact a dietitian for more information. Summary Heart-healthy meal planning includes eating less unhealthy fats, eating more healthy fats, and making other changes in your diet. Eat a balanced diet. This includes fruits and vegetables, low-fat or nonfat dairy, lean protein, nuts and legumes, whole grains, and  heart-healthy oils and fats. This information is not intended to replace advice given to you by your health care provider. Make sure you discuss any questions you have with your health care provider. Document Revised: 09/27/2020 Document Reviewed: 09/27/2020 Elsevier Patient Education  2022 [employer]  

## 2021-02-07 NOTE — Assessment & Plan Note (Signed)
Continue Cosentyx She will continue to follow with rheumatology 

## 2021-02-07 NOTE — Assessment & Plan Note (Signed)
Mainly in her hips and knees Encouraged weight loss as this can help reduce joint pain She may have a bursitis to her right hip as well

## 2021-02-07 NOTE — Progress Notes (Signed)
HPI  Pt presents to the clinic today to establish care and for management of the conditions listed below. She is transferring care from [facility].   Psoriatic Arthritis: Managed on Cosentyx. She follows with rheumatology.  OA: Mainly in her knees. She takes Ibuprofen as needed with some relief of symptoms.  HLD: Her last LDL was 94, triglycerides 132, 03/2020. She denies myalgias on Rosuvastatin and Fish Oil. She tries to consume a low fat diet.  Osteoporosis: There is no bone density to review.e She is taking Alendronate as prescribed. She tries to get weight bearing exercise daily.  She also reports intermittent left knee and right hip pain. She noticed this 2 months ago. She decribes the pain as sore and achy. The pain is worse when she is laying ib bed.  She also reports varicose veins of BLE. She has noticed that they bulge and are tender to touch.  Past Medical History:  Diagnosis Date   Arthritis    Glaucoma     Current Outpatient Medications  Medication Sig Dispense Refill   alendronate (FOSAMAX) 70 MG tablet Take 70 mg by mouth once a week. Take with a full glass of water on an empty stomach.     Biotin 1 MG CAPS Take 1 capsule by mouth daily.     calcium citrate-vitamin D (CITRACAL+D) 315-200 MG-UNIT tablet Take 1 tablet by mouth every morning.     cyanocobalamin 100 MCG tablet      dorzolamide-timolol (COSOPT) 22.3-6.8 MG/ML ophthalmic solution Apply to eye.     ferrous gluconate (FERGON) 324 MG tablet Take by mouth.     Omega-3 Fatty Acids (FISH OIL) 1000 MG CAPS Take 1 capsule by mouth daily.     rosuvastatin (CRESTOR) 5 MG tablet Take 5 mg by mouth daily.     Secukinumab (COSENTYX) 150 MG/ML SOSY Inject into the skin.     No current facility-administered medications for this visit.    No Known Allergies  Family History  Problem Relation Age of Onset   Breast cancer Mother 65   Breast cancer Paternal Grandmother     Social History   Socioeconomic  History   Marital status: Divorced    Spouse name: Not on file   Number of children: Not on file   Years of education: Not on file   Highest education level: Not on file  Occupational History   Not on file  Tobacco Use   Smoking status: Never   Smokeless tobacco: Never  Vaping Use   Vaping Use: Never used  Substance and Sexual Activity   Alcohol use: Yes    Comment: occasional   Drug use: Never   Sexual activity: Not on file  Other Topics Concern   Not on file  Social History Narrative   Not on file   Social Determinants of Health   Financial Resource Strain: Not on file  Food Insecurity: Not on file  Transportation Needs: Not on file  Physical Activity: Not on file  Stress: Not on file  Social Connections: Not on file  Intimate Partner Violence: Not on file    ROS:  Constitutional: Denies fever, malaise, fatigue, headache or abrupt weight changes.  HEENT: Denies eye pain, eye redness, ear pain, ringing in the ears, wax buildup, runny nose, nasal congestion, bloody nose, or sore throat. Respiratory: Denies difficulty breathing, shortness of breath, cough or sputum production.   Cardiovascular: Pt reports varicose veins of BLE. Denies chest pain, chest tightness, palpitations or swelling in  the hands or feet.  Gastrointestinal: Denies abdominal pain, bloating, constipation, diarrhea or blood in the stool.  GU: Denies frequency, urgency, pain with urination, blood in urine, odor or discharge. Musculoskeletal: Pt reports left knee pain, right hip pain. Denies decrease in range of motion, difficulty with gait, muscle pain or joint pain and swelling.  Skin: Denies redness, rashes, lesions or ulcercations.  Neurological: Denies dizziness, difficulty with memory, difficulty with speech or problems with balance and coordination.  Psych: Denies anxiety, depression, SI/HI.  No other specific complaints in a complete review of systems (except as listed in HPI above).  PE:  BP  (!) 140/51 (BP Location: Right Arm, Patient Position: Sitting, Cuff Size: Normal)   Pulse (!) 55   Temp 98.1 F (36.7 C) (Oral)   Resp 17   Ht 5' 2.5" (1.588 m)   Wt 175 lb (79.4 kg)   SpO2 100%   BMI 31.50 kg/m  Wt Readings from Last 3 Encounters:  02/07/21 175 lb (79.4 kg)  12/12/20 174 lb (78.9 kg)  07/18/20 188 lb (85.3 kg)    General: Appears her stated age, obese, in NAD. HEENT: Head: normal shape and size; Eyes: sclera white and EOMs intact;  Cardiovascular: Normal rate and rhythm. S1,S2 noted.  No murmur, rubs or gallops noted. No JVD or BLE edema. Varicose veins noted of BLE, R>L, tender with palpation. Pulmonary/Chest: Normal effort and positive vesicular breath sounds. No respiratory distress. No wheezes, rales or ronchi noted.  Musculoskeletal: Normal flexion and extension of the left knee. Joint enlargement without effusion. No pain with palpation of the left knee. Normal abduction, adduction, flexion, extension, internal and external rotation of the right hip. Pain with palpation over the right trochanter. Strength 5/5 BLE, No difficulty with gait.  Neurological: Alert and oriented. Cranial nerves II-XII grossly intact. Coordination normal.  Psychiatric: Mood and affect normal. Behavior is normal. Judgment and thought content normal.    BMET    Component Value Date/Time   NA 139 01/24/2018 1056   K 3.9 01/24/2018 1056   CL 107 01/24/2018 1056   CO2 26 01/24/2018 1056   GLUCOSE 98 01/24/2018 1056   BUN 16 01/24/2018 1056   CREATININE 0.71 01/24/2018 1056   CALCIUM 8.5 (L) 01/24/2018 1056   GFRNONAA >60 01/24/2018 1056   GFRAA >60 01/24/2018 1056    Lipid Panel  No results found for: CHOL, TRIG, HDL, CHOLHDL, VLDL, LDLCALC  CBC    Component Value Date/Time   WBC 8.7 01/24/2018 1056   RBC 3.65 (L) 01/24/2018 1056   HGB 12.1 01/24/2018 1056   HCT 35.3 01/24/2018 1056   PLT 270 01/24/2018 1056   MCV 96.7 01/24/2018 1056   MCH 33.3 01/24/2018 1056   MCHC  34.4 01/24/2018 1056   RDW 13.3 01/24/2018 1056   LYMPHSABS 2.2 01/24/2018 1056   MONOABS 0.9 01/24/2018 1056   EOSABS 0.1 01/24/2018 1056   BASOSABS 0.1 01/24/2018 1056    Hgb A1C No results found for: HGBA1C   Assessment and Plan:

## 2021-02-07 NOTE — Assessment & Plan Note (Signed)
She has failed compression hose Referral to vascular surgery for further evaluation and treatment

## 2021-02-07 NOTE — Assessment & Plan Note (Signed)
Encouraged her to consume a low fat diet Continue Rosuvastatin and Fish Oil

## 2021-02-26 DIAGNOSIS — H35371 Puckering of macula, right eye: Secondary | ICD-10-CM | POA: Diagnosis not present

## 2021-02-26 DIAGNOSIS — H35342 Macular cyst, hole, or pseudohole, left eye: Secondary | ICD-10-CM | POA: Diagnosis not present

## 2021-02-26 DIAGNOSIS — H35341 Macular cyst, hole, or pseudohole, right eye: Secondary | ICD-10-CM | POA: Diagnosis not present

## 2021-03-07 ENCOUNTER — Ambulatory Visit (INDEPENDENT_AMBULATORY_CARE_PROVIDER_SITE_OTHER): Payer: Medicare Other | Admitting: Internal Medicine

## 2021-03-07 ENCOUNTER — Other Ambulatory Visit: Payer: Self-pay

## 2021-03-07 ENCOUNTER — Encounter: Payer: Self-pay | Admitting: Internal Medicine

## 2021-03-07 VITALS — BP 122/48 | HR 72 | Temp 97.9°F | Resp 18 | Ht 62.5 in | Wt 173.8 lb

## 2021-03-07 DIAGNOSIS — R21 Rash and other nonspecific skin eruption: Secondary | ICD-10-CM | POA: Diagnosis not present

## 2021-03-07 NOTE — Patient Instructions (Signed)
Rash, Adult  A rash is a change in the color of your skin. A rash can also change the way your skin feels. There are many different conditions and factors that can causea rash. Follow these instructions at home: The goal of treatment is to stop the itching and keep the rash from spreading. Watch for any changes in your symptoms. Let your doctor know about them. Followthese instructions to help with your condition: Medicine Take or apply over-the-counter and prescription medicines only as told by your doctor. These may include medicines: To treat red or swollen skin (corticosteroid creams). To treat itching. To treat an allergy (oral antihistamines). To treat very bad symptoms (oral corticosteroids).  Skin care Put cool cloths (compresses) on the affected areas. Do not scratch or rub your skin. Avoid covering the rash. Make sure that the rash is exposed to air as much as possible. Managing itching and discomfort Avoid hot showers or baths. These can make itching worse. A cold shower may help. Try taking a bath with: Epsom salts. You can get these at your local pharmacy or grocery store. Follow the instructions on the package. Baking soda. Pour a small amount into the bath as told by your doctor. Colloidal oatmeal. You can get this at your local pharmacy or grocery store. Follow the instructions on the package. Try putting baking soda paste onto your skin. Stir water into baking soda until it gets like a paste. Try putting on a lotion that relieves itchiness (calamine lotion). Keep cool and out of the sun. Sweating and being hot can make itching worse. General instructions  Rest as needed. Drink enough fluid to keep your pee (urine) pale yellow. Wear loose-fitting clothing. Avoid scented soaps, detergents, and perfumes. Use gentle soaps, detergents, perfumes, and other cosmetic products. Avoid anything that causes your rash. Keep a journal to help track what causes your rash. Write  down: What you eat. What cosmetic products you use. What you drink. What you wear. This includes jewelry. Keep all follow-up visits as told by your doctor. This is important.  Contact a doctor if: You sweat at night. You lose weight. You pee (urinate) more than normal. You pee less than normal, or you notice that your pee is a darker color than normal. You feel weak. You throw up (vomit). Your skin or the whites of your eyes look yellow (jaundice). Your skin: Tingles. Is numb. Your rash: Does not go away after a few days. Gets worse. You are: More thirsty than normal. More tired than normal. You have: New symptoms. Pain in your belly (abdomen). A fever. Watery poop (diarrhea). Get help right away if: You have a fever and your symptoms suddenly get worse. You start to feel mixed up (confused). You have a very bad headache or a stiff neck. You have very bad joint pains or stiffness. You have jerky movements that you cannot control (seizure). Your rash covers all or most of your body. The rash may or may not be painful. You have blisters that: Are on top of the rash. Grow larger. Grow together. Are painful. Are inside your nose or mouth. You have a rash that: Looks like purple pinprick-sized spots all over your body. Has a "bull's eye" or looks like a target. Is red and painful, causes your skin to peel, and is not from being in the sun too long. Summary A rash is a change in the color of your skin. A rash can also change the way your skin feels.   The goal of treatment is to stop the itching and keep the rash from spreading. Take or apply over-the-counter and prescription medicines only as told by your doctor. Contact a doctor if you have new symptoms or symptoms that get worse. Keep all follow-up visits as told by your doctor. This is important. This information is not intended to replace advice given to you by your health care provider. Make sure you discuss any  questions you have with your healthcare provider. Document Revised: 09/10/2018 Document Reviewed: 12/21/2017 Elsevier Patient Education  2022 [employer] Inc[employer]  

## 2021-03-07 NOTE — Progress Notes (Signed)
Subjective:    Patient ID: Anita Mathews, female    DOB: 05/07/1951, 70 y.o.   MRN: [id number]  HPI  Pt presents to the clinic today with c/o skin irritation.  She reports this started 1 week ago.  She describes the pain as a raw feeling on the right side of her buttock.  She denies scratching the area.  She does not recall any insect bite or sting.  She denies changes in soaps, lotions or detergents.  She is put A&D ointment on the area with improvement in symptoms.  She reports it does not even hurt today.  Review of Systems     Past Medical History:  Diagnosis Date   Arthritis    Glaucoma     Current Outpatient Medications  Medication Sig Dispense Refill   alendronate (FOSAMAX) 70 MG tablet Take 70 mg by mouth once a week. Take with a full glass of water on an empty stomach.     Biotin 1 MG CAPS Take 1 capsule by mouth daily.     calcium citrate-vitamin D (CITRACAL+D) 315-200 MG-UNIT tablet Take 1 tablet by mouth every morning.     cyanocobalamin 100 MCG tablet      dorzolamide-timolol (COSOPT) 22.3-6.8 MG/ML ophthalmic solution Apply to eye.     ferrous gluconate (FERGON) 324 MG tablet Take by mouth.     Omega-3 Fatty Acids (FISH OIL) 1000 MG CAPS Take 1 capsule by mouth daily.     rosuvastatin (CRESTOR) 5 MG tablet Take 5 mg by mouth daily.     Secukinumab (COSENTYX) 150 MG/ML SOSY Inject into the skin.     No current facility-administered medications for this visit.    No Known Allergies  Family History  Problem Relation Age of Onset   Breast cancer Mother 75   Breast cancer Paternal Grandmother     Social History   Socioeconomic History   Marital status: Divorced    Spouse name: Not on file   Number of children: Not on file   Years of education: Not on file   Highest education level: Not on file  Occupational History   Not on file  Tobacco Use   Smoking status: Never   Smokeless tobacco: Never  Vaping Use   Vaping Use: Never used  Substance and  Sexual Activity   Alcohol use: Yes    Comment: occasional   Drug use: Never   Sexual activity: Not on file  Other Topics Concern   Not on file  Social History Narrative   Not on file   Social Determinants of Health   Financial Resource Strain: Not on file  Food Insecurity: Not on file  Transportation Needs: Not on file  Physical Activity: Not on file  Stress: Not on file  Social Connections: Not on file  Intimate Partner Violence: Not on file     Constitutional: Denies fever, malaise, fatigue, headache or abrupt weight changes.  Respiratory: Denies difficulty breathing, shortness of breath, cough or sputum production.   Cardiovascular: Denies chest pain, chest tightness, palpitations or swelling in the hands or feet.  Skin: Pt reports irritation of right buttock. Denies ulcercations.    No other specific complaints in a complete review of systems (except as listed in HPI above).  Objective:   Physical Exam  BP (!) 122/48 (BP Location: Right Arm, Patient Position: Sitting, Cuff Size: Normal)   Pulse 72   Temp 97.9 F (36.6 C) (Temporal)   Resp 18   Ht 5'  2.5" (1.588 m)   Wt 173 lb 12.8 oz (78.8 kg)   SpO2 100%   BMI 31.28 kg/m   Wt Readings from Last 3 Encounters:  02/07/21 175 lb (79.4 kg)  12/12/20 174 lb (78.9 kg)  07/18/20 188 lb (85.3 kg)    General: Appears her stated age, obese, in NAD. Skin: Warm, dry and intact. No redness, rashes, excoriation, lesions or ulcerations noted. Cardiovascular: Normal rate and rhythm.  Pulmonary/Chest: Normal effort. Neurological: Alert and oriented.   BMET    Component Value Date/Time   NA 139 01/24/2018 1056   K 3.9 01/24/2018 1056   CL 107 01/24/2018 1056   CO2 26 01/24/2018 1056   GLUCOSE 98 01/24/2018 1056   BUN 16 01/24/2018 1056   CREATININE 0.71 01/24/2018 1056   CALCIUM 8.5 (L) 01/24/2018 1056   GFRNONAA >60 01/24/2018 1056   GFRAA >60 01/24/2018 1056    Lipid Panel  No results found for: CHOL, TRIG,  HDL, CHOLHDL, VLDL, LDLCALC  CBC    Component Value Date/Time   WBC 8.7 01/24/2018 1056   RBC 3.65 (L) 01/24/2018 1056   HGB 12.1 01/24/2018 1056   HCT 35.3 01/24/2018 1056   PLT 270 01/24/2018 1056   MCV 96.7 01/24/2018 1056   MCH 33.3 01/24/2018 1056   MCHC 34.4 01/24/2018 1056   RDW 13.3 01/24/2018 1056   LYMPHSABS 2.2 01/24/2018 1056   MONOABS 0.9 01/24/2018 1056   EOSABS 0.1 01/24/2018 1056   BASOSABS 0.1 01/24/2018 1056    Hgb A1C No results found for: HGBA1C          Assessment & Plan:   Skin Irritation of Buttocks:  Seems to have resolved We will continue to monitor for recurrent symptoms  Return precautions discussed  Nicki Reaper, NP This visit occurred during the SARS-CoV-2 public health emergency.  Safety protocols were in place, including screening questions prior to the visit, additional usage of staff PPE, and extensive cleaning of exam room while observing appropriate contact time as indicated for disinfecting solutions.

## 2021-03-20 ENCOUNTER — Ambulatory Visit (INDEPENDENT_AMBULATORY_CARE_PROVIDER_SITE_OTHER): Payer: Medicare Other | Admitting: Internal Medicine

## 2021-03-20 ENCOUNTER — Encounter: Payer: Self-pay | Admitting: Internal Medicine

## 2021-03-20 ENCOUNTER — Other Ambulatory Visit: Payer: Self-pay

## 2021-03-20 VITALS — BP 152/56 | HR 56 | Temp 97.3°F | Resp 17 | Ht 62.5 in | Wt 172.2 lb

## 2021-03-20 DIAGNOSIS — Z1159 Encounter for screening for other viral diseases: Secondary | ICD-10-CM

## 2021-03-20 DIAGNOSIS — E782 Mixed hyperlipidemia: Secondary | ICD-10-CM | POA: Diagnosis not present

## 2021-03-20 DIAGNOSIS — Z23 Encounter for immunization: Secondary | ICD-10-CM | POA: Diagnosis not present

## 2021-03-20 DIAGNOSIS — E611 Iron deficiency: Secondary | ICD-10-CM

## 2021-03-20 DIAGNOSIS — R1012 Left upper quadrant pain: Secondary | ICD-10-CM | POA: Diagnosis not present

## 2021-03-20 DIAGNOSIS — Z0000 Encounter for general adult medical examination without abnormal findings: Secondary | ICD-10-CM | POA: Diagnosis not present

## 2021-03-20 DIAGNOSIS — R030 Elevated blood-pressure reading, without diagnosis of hypertension: Secondary | ICD-10-CM

## 2021-03-20 DIAGNOSIS — M79675 Pain in left toe(s): Secondary | ICD-10-CM

## 2021-03-20 NOTE — Progress Notes (Signed)
HPI:  Patient presents the clinic today for her subsequent annual Medicare wellness exam.   Past Medical History:  Diagnosis Date   Arthritis    Glaucoma     Current Outpatient Medications  Medication Sig Dispense Refill   alendronate (FOSAMAX) 70 MG tablet Take 70 mg by mouth once a week. Take with a full glass of water on an empty stomach.     Biotin 1 MG CAPS Take 1 capsule by mouth daily.     calcium citrate-vitamin D (CITRACAL+D) 315-200 MG-UNIT tablet Take 1 tablet by mouth every morning.     cyanocobalamin 100 MCG tablet      dorzolamide-timolol (COSOPT) 22.3-6.8 MG/ML ophthalmic solution Apply to eye.     ferrous gluconate (FERGON) 324 MG tablet Take by mouth.     Omega-3 Fatty Acids (FISH OIL) 1000 MG CAPS Take 1 capsule by mouth daily.     rosuvastatin (CRESTOR) 5 MG tablet Take 5 mg by mouth daily.     Secukinumab (COSENTYX) 150 MG/ML SOSY Inject into the skin.     No current facility-administered medications for this visit.    No Known Allergies  Family History  Problem Relation Age of Onset   Breast cancer Mother 60   Breast cancer Paternal Grandmother     Social History   Socioeconomic History   Marital status: Divorced    Spouse name: Not on file   Number of children: Not on file   Years of education: Not on file   Highest education level: Not on file  Occupational History   Not on file  Tobacco Use   Smoking status: Never   Smokeless tobacco: Never  Vaping Use   Vaping Use: Never used  Substance and Sexual Activity   Alcohol use: Yes    Comment: occasional   Drug use: Never   Sexual activity: Not on file  Other Topics Concern   Not on file  Social History Narrative   Not on file   Social Determinants of Health   Financial Resource Strain: Not on file  Food Insecurity: Not on file  Transportation Needs: Not on file  Physical Activity: Not on file  Stress: Not on file  Social Connections: Not on file  Intimate Partner Violence: Not on  file    Hospitiliaztions: None  Health Maintenance:    Flu: 03/2020  Tetanus: 11/2012  Pneumovax: 01/2019  Prevnar: 05/2017  Shingrix: 11/2020  COVID: Pfizer x4  Mammogram: 07/2020  Pap Smear: 08/2020, [facility]  Bone Density: 03/2020  Colon Screening: 04/2019 5 years  Eye Doctor: every 3 months  Dental Exam: biannually   Providers:   PCP: Webb Silversmith, NP  Rheumatologist: Dr. Jefm Bryant  Ophthalmologist: Dr. Stacey Drain  Gynecologist: Dr. Ouida Sills  Sports medicine: Dr. Domenica Fail   I have personally reviewed and have noted:  1. The patient's medical and social history 2. Their use of alcohol, tobacco or illicit drugs 3. Their current medications and supplements 4. The patient's functional ability including ADL's, fall risks, home safety risks and hearing or visual impairment. 5. Diet and physical activities 6. Evidence for depression or mood disorder  Subjective:   Review of Systems:   Constitutional: Denies fever, malaise, fatigue, headache or abrupt weight changes.  HEENT: Denies eye pain, eye redness, ear pain, ringing in the ears, wax buildup, runny nose, nasal congestion, bloody nose, or sore throat. Respiratory: Denies difficulty breathing, shortness of breath, cough or sputum production.   Cardiovascular: Denies chest pain, chest tightness, palpitations or  swelling in the hands or feet.  Gastrointestinal: Pt reports LUQ abdominal pain. Denies bloating, constipation, diarrhea or blood in the stool.  GU: Pt reports nocturia. Denies urgency, frequency, pain with urination, burning sensation, blood in urine, odor or discharge. Musculoskeletal: Patient reports multiple joint pain, pain of left great toe.  Denies decrease in range of motion, difficulty with gait, muscle pain or joint swelling.  Skin: Denies redness, rashes, lesions or ulcercations.  Neurological: Denies dizziness, difficulty with memory, difficulty with speech or problems with balance and coordination.   Psych: Denies anxiety, depression, SI/HI.  No other specific complaints in a complete review of systems (except as listed in HPI above).  Objective:  PE:  BP (!) 152/56 (BP Location: Right Arm, Patient Position: Sitting, Cuff Size: Normal)   Pulse (!) 56   Temp (!) 97.3 F (36.3 C) (Temporal)   Resp 17   Ht 5' 2.5" (1.588 m)   Wt 172 lb 3.2 oz (78.1 kg)   SpO2 100%   BMI 30.99 kg/m   Wt Readings from Last 3 Encounters:  03/07/21 173 lb 12.8 oz (78.8 kg)  02/07/21 175 lb (79.4 kg)  12/12/20 174 lb (78.9 kg)    General: Appears her stated age, obese, in NAD. Skin: Warm, dry and intact.  HEENT: Head: normal shape and size; Eyes: EOMs intact;  Neck: Neck supple, trachea midline. No masses, lumps or thyromegaly present.  Cardiovascular: Normal rate and rhythm. S1,S2 noted.  No murmur, rubs or gallops noted. No JVD or BLE edema. No carotid bruits noted. Pulmonary/Chest: Normal effort and positive vesicular breath sounds. No respiratory distress. No wheezes, rales or ronchi noted.  Abdomen: Soft and mildly tender in the LUQ. Normal bowel sounds. No distention or masses noted. Liver, spleen and kidneys non palpable. Musculoskeletal: Strength 5/5 BUE/BLE. No joint swelling of the left great toe.  No difficulty with gait. Neurological: Alert and oriented. Cranial nerves II-XII grossly intact. Coordination normal.  Psychiatric: Mood and affect normal. Behavior is normal. Judgment and thought content normal.   BMET    Component Value Date/Time   NA 139 01/24/2018 1056   K 3.9 01/24/2018 1056   CL 107 01/24/2018 1056   CO2 26 01/24/2018 1056   GLUCOSE 98 01/24/2018 1056   BUN 16 01/24/2018 1056   CREATININE 0.71 01/24/2018 1056   CALCIUM 8.5 (L) 01/24/2018 1056   GFRNONAA >60 01/24/2018 1056   GFRAA >60 01/24/2018 1056    Lipid Panel  No results found for: CHOL, TRIG, HDL, CHOLHDL, VLDL, LDLCALC  CBC    Component Value Date/Time   WBC 8.7 01/24/2018 1056   RBC 3.65 (L)  01/24/2018 1056   HGB 12.1 01/24/2018 1056   HCT 35.3 01/24/2018 1056   PLT 270 01/24/2018 1056   MCV 96.7 01/24/2018 1056   MCH 33.3 01/24/2018 1056   MCHC 34.4 01/24/2018 1056   RDW 13.3 01/24/2018 1056   LYMPHSABS 2.2 01/24/2018 1056   MONOABS 0.9 01/24/2018 1056   EOSABS 0.1 01/24/2018 1056   BASOSABS 0.1 01/24/2018 1056    Hgb A1C No results found for: HGBA1C    Assessment and Plan:   Medicare Annual Wellness Visit:  Diet: She does eat some meat.  She consumes fruits and vegetables.  She tries to avoid fried foods.  She drinks mostly water or sparkling water. Physical activity: Yardwork Depression/mood screen: Negative, PHQ 9 score of 0 Hearing: Intact to whispered voice Visual acuity: Grossly normal, performs annual eye exam  ADLs: Capable Fall  risk: None Home safety: Good Cognitive evaluation: Intact to orientation, naming, recall and repetition EOL planning: No adv directives, full code/ I agree  Preventative Medicine: Flu shot today.  Tetanus UTD.  Pneumovax and Prevnar UTD.  Encouraged her to get a Shingrix vaccine at the pharmacy.  COVID-vaccine UTD.  Mammogram due 07/2021.  Pap smear UTD. Bone density UTD.  Colon screening due 2025.  Encouraged her to consume a balanced diet and exercise regimen.  Advised her to see an eye doctor and dentist annually.  Will check CBC, c-Met, lipid and hep C today.  Due dates for screening exam given to patient as part of her AVS.  LUQ Pain:  Will check CMET, Amylase and Lipase today Could consider CT abd for further evaluation of symptoms   Left Great Toe Pain:  Exam benign Will monitor for now  Next appointment: 1 year, Medicare wellness exam   Webb Silversmith, NP This visit occurred during the SARS-CoV-2 public health emergency.  Safety protocols were in place, including screening questions prior to the visit, additional usage of staff PPE, and extensive cleaning of exam room while observing appropriate contact time as  indicated for disinfecting solutions.

## 2021-03-21 ENCOUNTER — Other Ambulatory Visit (INDEPENDENT_AMBULATORY_CARE_PROVIDER_SITE_OTHER): Payer: Self-pay | Admitting: Nurse Practitioner

## 2021-03-21 DIAGNOSIS — R030 Elevated blood-pressure reading, without diagnosis of hypertension: Secondary | ICD-10-CM | POA: Insufficient documentation

## 2021-03-21 DIAGNOSIS — I83813 Varicose veins of bilateral lower extremities with pain: Secondary | ICD-10-CM

## 2021-03-21 LAB — LIPASE: Lipase: 30 U/L (ref 7–60)

## 2021-03-21 LAB — CBC
HCT: 40 % (ref 35.0–45.0)
Hemoglobin: 13.3 g/dL (ref 11.7–15.5)
MCH: 30.9 pg (ref 27.0–33.0)
MCHC: 33.3 g/dL (ref 32.0–36.0)
MCV: 92.8 fL (ref 80.0–100.0)
MPV: 10.7 fL (ref 7.5–12.5)
Platelets: 304 10*3/uL (ref 140–400)
RBC: 4.31 10*6/uL (ref 3.80–5.10)
RDW: 12.7 % (ref 11.0–15.0)
WBC: 5.5 10*3/uL (ref 3.8–10.8)

## 2021-03-21 LAB — ADVANCED WRITTEN NOTIFICATION (AWN) TEST REFUSAL: AWN TEST REFUSED: 5616

## 2021-03-21 LAB — COMPLETE METABOLIC PANEL WITH GFR
AG Ratio: 1.7 (calc) (ref 1.0–2.5)
ALT: 15 U/L (ref 6–29)
AST: 19 U/L (ref 10–35)
Albumin: 4.7 g/dL (ref 3.6–5.1)
Alkaline phosphatase (APISO): 91 U/L (ref 37–153)
BUN: 10 mg/dL (ref 7–25)
CO2: 29 mmol/L (ref 20–32)
Calcium: 9.8 mg/dL (ref 8.6–10.4)
Chloride: 102 mmol/L (ref 98–110)
Creat: 0.9 mg/dL (ref 0.60–1.00)
Globulin: 2.7 g/dL (calc) (ref 1.9–3.7)
Glucose, Bld: 94 mg/dL (ref 65–139)
Potassium: 4.7 mmol/L (ref 3.5–5.3)
Sodium: 140 mmol/L (ref 135–146)
Total Bilirubin: 0.7 mg/dL (ref 0.2–1.2)
Total Protein: 7.4 g/dL (ref 6.1–8.1)
eGFR: 69 mL/min/{1.73_m2} (ref >=60)

## 2021-03-21 LAB — HEPATITIS C ANTIBODY
Hepatitis C Ab: NONREACTIVE
SIGNAL TO CUT-OFF: 0.01 (ref <1.00)

## 2021-03-21 LAB — LIPID PANEL
Cholesterol: 189 mg/dL (ref <200)
HDL: 83 mg/dL (ref >=50)
LDL Cholesterol (Calc): 90 mg/dL (calc)
Non-HDL Cholesterol (Calc): 106 mg/dL (calc) (ref <130)
Total CHOL/HDL Ratio: 2.3 (calc) (ref <5.0)
Triglycerides: 70 mg/dL (ref <150)

## 2021-03-21 LAB — AMYLASE: Amylase: 28 U/L (ref 21–101)

## 2021-03-21 NOTE — Patient Instructions (Signed)

## 2021-03-21 NOTE — Assessment & Plan Note (Signed)
Repeat remains elevated She reports it is always high at the office, better at home If remains elevated, will discuss medication management

## 2021-03-25 ENCOUNTER — Ambulatory Visit (INDEPENDENT_AMBULATORY_CARE_PROVIDER_SITE_OTHER): Payer: Medicare Other | Admitting: Nurse Practitioner

## 2021-03-25 ENCOUNTER — Encounter (INDEPENDENT_AMBULATORY_CARE_PROVIDER_SITE_OTHER): Payer: Self-pay | Admitting: Nurse Practitioner

## 2021-03-25 ENCOUNTER — Encounter: Payer: Self-pay | Admitting: Internal Medicine

## 2021-03-25 ENCOUNTER — Ambulatory Visit (INDEPENDENT_AMBULATORY_CARE_PROVIDER_SITE_OTHER): Payer: Medicare Other

## 2021-03-25 ENCOUNTER — Other Ambulatory Visit: Payer: Self-pay

## 2021-03-25 VITALS — BP 160/73 | HR 51 | Resp 16 | Ht 63.5 in | Wt 178.2 lb

## 2021-03-25 DIAGNOSIS — I83813 Varicose veins of bilateral lower extremities with pain: Secondary | ICD-10-CM

## 2021-03-25 DIAGNOSIS — E782 Mixed hyperlipidemia: Secondary | ICD-10-CM

## 2021-03-26 ENCOUNTER — Ambulatory Visit
Admission: RE | Admit: 2021-03-26 | Discharge: 2021-03-26 | Disposition: A | Discharge status: Home / Self Care | Payer: Medicare Other | Source: Ambulatory Visit | Attending: Internal Medicine | Admitting: Internal Medicine

## 2021-03-26 ENCOUNTER — Ambulatory Visit (INDEPENDENT_AMBULATORY_CARE_PROVIDER_SITE_OTHER): Payer: Medicare Other | Admitting: Internal Medicine

## 2021-03-26 ENCOUNTER — Encounter: Payer: Self-pay | Admitting: Internal Medicine

## 2021-03-26 ENCOUNTER — Ambulatory Visit
Admission: RE | Admit: 2021-03-26 | Discharge: 2021-03-26 | Disposition: A | Discharge status: Home / Self Care | Payer: Medicare Other | Source: Home / Self Care | Attending: Internal Medicine | Admitting: Internal Medicine

## 2021-03-26 ENCOUNTER — Other Ambulatory Visit: Payer: Self-pay

## 2021-03-26 VITALS — BP 121/40 | HR 62 | Temp 97.1°F | Resp 18 | Ht 63.5 in | Wt 175.4 lb

## 2021-03-26 DIAGNOSIS — M79675 Pain in left toe(s): Secondary | ICD-10-CM | POA: Diagnosis not present

## 2021-03-26 DIAGNOSIS — M7989 Other specified soft tissue disorders: Secondary | ICD-10-CM | POA: Diagnosis not present

## 2021-03-26 DIAGNOSIS — M19072 Primary osteoarthritis, left ankle and foot: Secondary | ICD-10-CM | POA: Diagnosis not present

## 2021-03-26 MED ORDER — PREDNISONE 10 MG PO TABS: 10.0000 mg | ORAL_TABLET | Freq: Every day | ORAL | 0 refills | Status: DC

## 2021-03-26 NOTE — Patient Instructions (Signed)
How to Buddy Tape °Buddy taping refers to taping an injured finger or toe to an uninjured finger or toe that is next to it. This protects the injured finger or toe and reduces movement while the injury heals. °You may buddy tape a finger or toe if you have a minor sprain. Your health care provider may buddy tape your finger or toe if you have a sprain, dislocation, or fracture. You may be told to replace your buddy taping as needed. °What are the risks? °Generally, buddy taping is safe. However, problems may occur, such as: °Skin injury or infection. °Reduced blood flow to the finger or toe. °Skin reaction to the tape. °Do not buddy tape your toe if you have diabetes. Do not buddy tape if you know that you have an allergy to adhesives or surgical tape. °Supplies needed: °Gauze pad, cotton, or cloth. °Tape. This may be called first-aid tape, surgical tape, or medical tape. °How to buddy tape °Before buddy taping °Lessen any pain and swelling with rest, icing, and elevation: °Avoid activities that cause pain. °If directed, put ice on the injured area. To do this: °Put ice in a plastic bag. °Place a towel between your skin and the bag. °Leave the ice on for 20 minutes, 2-3 times a day. °Remove the ice if your skin turns bright red. This is very important. If you cannot feel pain, heat, or cold, you have a greater risk of damage to the area. °Raise (elevate) your hand or foot above the level of your heart while you are sitting or lying down. ° °Buddy taping procedure ° °Clean and dry your finger or toe as told by your health care provider. °Place a gauze pad or a piece of cloth or cotton between your injured finger or toe and the uninjured finger or toe. °Use tape to wrap around both fingers or toes so your injured finger or toe is secured to the uninjured finger or toe. °The tape should be snug, but not tight. °Make sure the ends of the piece of tape overlap. °Avoid placing tape directly over the joint. °Change the  tape and the padding as told by your health care provider. Remove and replace the tape or padding if it becomes loose, worn, dirty, or wet. °After buddy taping °Watch the buddy-taped area and always remove buddy taping if: °Your pain gets worse. °Your fingers or toes turn pale or blue. °Your skin becomes irritated. °Follow these instructions at home: °Take over-the-counter and prescription medicines only as told by your health care provider. °Return to your normal activities as told by your health care provider. Ask your health care provider what activities are safe for you. °Contact a health care provider if: °You have pain, swelling, or bruising that lasts longer than 3 days. °You have a fever. °Your skin is red, cracked, or irritated. °Get help right away if: °The injured area becomes cold, numb, or pale. °You have severe pain, swelling, bruising, or loss of movement in your finger or toe. °Your finger or toe changes shape (deformity). °Summary °Buddy taping refers to taping an injured finger or toe to an uninjured finger or toe that is next to it. °You may buddy tape a finger or toe if you have a minor sprain or fracture. °Take over-the-counter and prescription medicines only as told by your health care provider. °This information is not intended to replace advice given to you by your health care provider. Make sure you discuss any questions you have with your   health care provider. °Document Revised: 03/08/2020 Document Reviewed: 03/08/2020 °Elsevier Patient Education © 2022 [employer] ° °

## 2021-03-26 NOTE — Progress Notes (Signed)
Subjective:    Patient ID: Anita Mathews, female    DOB: 10-Jul-1950, 70 y.o.   MRN: [id number]  HPI  Pt presents to the clinic today with c/o pain in her left great toe. She reports this started 1 week ago while she was at the beach. She describes the pain as achy and throbbing. She has noticed some redness and swelling but denies any specific injury to the area. She has noticed a red streak running through her nailbed and is not sure if this is related or not. She has no history of gout. She has not noticed any drainage from around the nail bed. She has tried to stay off it but it does not seem to be getting any better.  Review of Systems     Past Medical History:  Diagnosis Date   Arthritis    Glaucoma     Current Outpatient Medications  Medication Sig Dispense Refill   alendronate (FOSAMAX) 70 MG tablet Take 70 mg by mouth once a week. Take with a full glass of water on an empty stomach.     Biotin 1 MG CAPS Take 1 capsule by mouth daily.     calcium citrate-vitamin D (CITRACAL+D) 315-200 MG-UNIT tablet Take 1 tablet by mouth every morning.     cyanocobalamin 100 MCG tablet      dorzolamide-timolol (COSOPT) 22.3-6.8 MG/ML ophthalmic solution Apply to eye.     ferrous gluconate (FERGON) 324 MG tablet Take 324 mg by mouth every other day.     Omega-3 Fatty Acids (FISH OIL) 1000 MG CAPS Take 1 capsule by mouth daily.     rosuvastatin (CRESTOR) 5 MG tablet Take 5 mg by mouth daily.     Secukinumab (COSENTYX) 150 MG/ML SOSY Inject 150 mg into the skin every 30 (thirty) days.     No current facility-administered medications for this visit.    No Known Allergies  Family History  Problem Relation Age of Onset   Breast cancer Mother 37   Breast cancer Paternal Grandmother     Social History   Socioeconomic History   Marital status: Divorced    Spouse name: Not on file   Number of children: Not on file   Years of education: Not on file   Highest education level: Not on  file  Occupational History   Not on file  Tobacco Use   Smoking status: Never   Smokeless tobacco: Never  Vaping Use   Vaping Use: Never used  Substance and Sexual Activity   Alcohol use: Yes    Comment: occasional   Drug use: Never   Sexual activity: Not on file  Other Topics Concern   Not on file  Social History Narrative   Not on file   Social Determinants of Health   Financial Resource Strain: Not on file  Food Insecurity: Not on file  Transportation Needs: Not on file  Physical Activity: Not on file  Stress: Not on file  Social Connections: Not on file  Intimate Partner Violence: Not on file     Constitutional: Denies fever, malaise, fatigue, headache or abrupt weight changes.  Respiratory: Denies difficulty breathing, shortness of breath, cough or sputum production.   Cardiovascular: Denies chest pain, chest tightness, palpitations or swelling in the hands or feet.  Musculoskeletal: Pt reports left great toe pain and swelling. Denies decrease in range of motion, difficulty with gait, muscle pain  Skin: Pt reports red line through left great toenail. Denies redness, rashes, lesions  or ulcercations.   No other specific complaints in a complete review of systems (except as listed in HPI above).  Objective:   Physical Exam  BP (!) 121/40 (BP Location: Right Arm, Patient Position: Sitting, Cuff Size: Large)   Pulse 62   Temp (!) 97.1 F (36.2 C) (Temporal)   Resp 18   Ht 5' 3.5" (1.613 m)   Wt 175 lb 6.4 oz (79.6 kg)   SpO2 99%   BMI 30.58 kg/m   Wt Readings from Last 3 Encounters:  03/25/21 178 lb 3.2 oz (80.8 kg)  03/20/21 172 lb 3.2 oz (78.1 kg)  03/07/21 173 lb 12.8 oz (78.8 kg)    General: Appears her stated age, obese, in NAD. Skin: Warm, dry and intact. Mild redness noted of the left great toe but no warmth or drainage noted. She has some hypopigmentation noted at the medial tip of the nail with a red line horizontally, halfway up the  nail. Cardiovascular: Normal rate. Pedal pulse 2+ on the left. Pulmonary/Chest: Normal effort. Abdomen: Slight swelling of the left great toe. Pain with palpation of the nailbed, tip of toe and the plantar surface of the toe pad. No difficulty with gait. Musculoskeletal: Normal range of motion. No signs of joint swelling. No difficulty with gait.  Neurological: Alert and oriented. Cranial nerves II-XII grossly intact. Coordination normal.  Psychiatric: Mood and affect normal. Behavior is normal. Judgment and thought content normal.     BMET    Component Value Date/Time   NA 140 03/20/2021 0934   K 4.7 03/20/2021 0934   CL 102 03/20/2021 0934   CO2 29 03/20/2021 0934   GLUCOSE 94 03/20/2021 0934   BUN 10 03/20/2021 0934   CREATININE 0.90 03/20/2021 0934   CALCIUM 9.8 03/20/2021 0934   GFRNONAA >60 01/24/2018 1056   GFRAA >60 01/24/2018 1056    Lipid Panel     Component Value Date/Time   CHOL 189 03/20/2021 0934   TRIG 70 03/20/2021 0934   HDL 83 03/20/2021 0934   CHOLHDL 2.3 03/20/2021 0934   LDLCALC 90 03/20/2021 0934    CBC    Component Value Date/Time   WBC 5.5 03/20/2021 0934   RBC 4.31 03/20/2021 0934   HGB 13.3 03/20/2021 0934   HCT 40.0 03/20/2021 0934   PLT 304 03/20/2021 0934   MCV 92.8 03/20/2021 0934   MCH 30.9 03/20/2021 0934   MCHC 33.3 03/20/2021 0934   RDW 12.7 03/20/2021 0934   LYMPHSABS 2.2 01/24/2018 1056   MONOABS 0.9 01/24/2018 1056   EOSABS 0.1 01/24/2018 1056   BASOSABS 0.1 01/24/2018 1056    Hgb A1C No results found for: HGBA1C        Assessment & Plan:   Pain and Swelling of the Left Great Toe, Discoloration of the Left Great Toenail:  Seems most c/w trauma although she denies known trauma Other DDx include gout, paronychia vs felon Will obtain xray of left great toe today RX for Prednisone 10 mg x 5 days  Will follow up after RX with further recommendations and treatment plan Nicki Reaper, NP This visit occurred during the  SARS-CoV-2 public health emergency.  Safety protocols were in place, including screening questions prior to the visit, additional usage of staff PPE, and extensive cleaning of exam room while observing appropriate contact time as indicated for disinfecting solutions.

## 2021-03-31 ENCOUNTER — Encounter (INDEPENDENT_AMBULATORY_CARE_PROVIDER_SITE_OTHER): Payer: Self-pay | Admitting: Nurse Practitioner

## 2021-03-31 NOTE — Progress Notes (Signed)
Subjective:    Patient ID: Anita Mathews, female    DOB: 10/30/1950, 70 y.o.   MRN: [id number] Chief Complaint  Patient presents with   New Patient (Initial Visit)    Ref Sampson Si varicose veins with pain    Anita Mathews is a 70 year old female that is seen for evaluation of symptomatic varicose veins. The patient relates burning and stinging which worsened steadily throughout the course of the day, particularly with standing. The patient also notes an aching and throbbing pain over the varicosities, particularly with prolonged dependent positions. The symptoms are significantly improved with elevation.  The patient also notes that during hot weather the symptoms are greatly intensified. The patient states the pain from the varicose veins interferes with work, daily exercise, shopping and household maintenance. At this point, the symptoms are persistent and severe enough that they're having a negative impact on lifestyle and are interfering with daily activities.  There is no history of DVT, PE or superficial thrombophlebitis. There is no history of ulceration or hemorrhage. The patient denies a significant family history of varicose veins.   The patient has not worn graduated compression in the past. At the present time the patient has not been using over-the-counter analgesics. There is no history of prior surgical intervention or sclerotherapy.  Today noninvasive study showed no evidence of DVT or superficial thrombophlebitis.  There is evidence of deep venous insufficiency in the right lower extremity as well as reflux in her great saphenous vein at the mid thigh.  The left lower extremity has no evidence of superficial venous reflux or deep venous insufficiency.  Previous Baker's cyst seen in the left lower extremity in 2017 is not visualized today.   Review of Systems  Cardiovascular:  Positive for leg swelling.  All other systems reviewed and are negative.     Objective:    Physical Exam Vitals reviewed.  HENT:     Head: Normocephalic.  Cardiovascular:     Rate and Rhythm: Normal rate.     Pulses: Normal pulses.  Pulmonary:     Effort: Pulmonary effort is normal.  Skin:    General: Skin is warm and dry.  Neurological:     Mental Status: She is alert and oriented to person, place, and time.  Psychiatric:        Mood and Affect: Mood normal.        Behavior: Behavior normal.        Thought Content: Thought content normal.        Judgment: Judgment normal.    BP (!) 160/73 (BP Location: Right Arm)   Pulse (!) 51   Resp 16   Ht 5' 3.5" (1.613 m)   Wt 178 lb 3.2 oz (80.8 kg)   BMI 31.07 kg/m   Past Medical History:  Diagnosis Date   Arthritis    Glaucoma     Social History   Socioeconomic History   Marital status: Divorced    Spouse name: Not on file   Number of children: Not on file   Years of education: Not on file   Highest education level: Not on file  Occupational History   Not on file  Tobacco Use   Smoking status: Never   Smokeless tobacco: Never  Vaping Use   Vaping Use: Never used  Substance and Sexual Activity   Alcohol use: Yes    Comment: occasional   Drug use: Never   Sexual activity: Not on file  Other Topics  Concern   Not on file  Social History Narrative   Not on file   Social Determinants of Health   Financial Resource Strain: Not on file  Food Insecurity: Not on file  Transportation Needs: Not on file  Physical Activity: Not on file  Stress: Not on file  Social Connections: Not on file  Intimate Partner Violence: Not on file    Past Surgical History:  Procedure Laterality Date   CESAREAN SECTION     EYE SURGERY     SHOULDER SURGERY Left     Family History  Problem Relation Age of Onset   Breast cancer Mother 61   Breast cancer Paternal Grandmother     No Known Allergies  CBC Latest Ref Rng & Units 03/20/2021 01/24/2018  WBC 3.8 - 10.8 Thousand/uL 5.5 8.7  Hemoglobin 11.7 - 15.5 g/dL 86.7  67.2  Hematocrit 35.0 - 45.0 % 40.0 35.3  Platelets 140 - 400 Thousand/uL 304 270      CMP     Component Value Date/Time   NA 140 03/20/2021 0934   K 4.7 03/20/2021 0934   CL 102 03/20/2021 0934   CO2 29 03/20/2021 0934   GLUCOSE 94 03/20/2021 0934   BUN 10 03/20/2021 0934   CREATININE 0.90 03/20/2021 0934   CALCIUM 9.8 03/20/2021 0934   PROT 7.4 03/20/2021 0934   ALBUMIN 3.9 01/24/2018 1056   AST 19 03/20/2021 0934   ALT 15 03/20/2021 0934   ALKPHOS 56 01/24/2018 1056   BILITOT 0.7 03/20/2021 0934   GFRNONAA >60 01/24/2018 1056   GFRAA >60 01/24/2018 1056     No results found.     Assessment & Plan:   1. Varicose veins of bilateral lower extremities with pain  Recommend:  The patient has large symptomatic varicose veins that are painful and associated with swelling.  I have had a long discussion with the patient regarding  varicose veins and why they cause symptoms.  Patient will begin wearing graduated compression stockings class 1 on a daily basis, beginning first thing in the morning and removing them in the evening. The patient is instructed specifically not to sleep in the stockings.    The patient  will also begin using over-the-counter analgesics such as Motrin 600 mg po TID to help control the symptoms.    In addition, behavioral modification including elevation during the day will be initiated.    Pending the results of these changes the  patient will be reevaluated in three months.   An  ultrasound of the venous system will be obtained.   Further plans will be based on the ultrasound results and whether conservative therapies are successful at eliminating the pain and swelling.    2. Mixed hyperlipidemia Continue statin as ordered and reviewed, no changes at this time    Current Outpatient Medications on File Prior to Visit  Medication Sig Dispense Refill   alendronate (FOSAMAX) 70 MG tablet Take 70 mg by mouth once a week. Take with a full glass of  water on an empty stomach.     Biotin 1 MG CAPS Take 1 capsule by mouth daily.     calcium citrate-vitamin D (CITRACAL+D) 315-200 MG-UNIT tablet Take 1 tablet by mouth every morning.     cyanocobalamin 100 MCG tablet      dorzolamide-timolol (COSOPT) 22.3-6.8 MG/ML ophthalmic solution Apply to eye.     ferrous gluconate (FERGON) 324 MG tablet Take 324 mg by mouth every other day.  Omega-3 Fatty Acids (FISH OIL) 1000 MG CAPS Take 1 capsule by mouth daily.     rosuvastatin (CRESTOR) 5 MG tablet Take 5 mg by mouth daily.     Secukinumab (COSENTYX) 150 MG/ML SOSY Inject 150 mg into the skin every 30 (thirty) days.     No current facility-administered medications on file prior to visit.    There are no Patient Instructions on file for this visit. No follow-ups on file.   Georgiana Spinner, NP

## 2021-05-27 ENCOUNTER — Encounter: Payer: Self-pay | Admitting: Internal Medicine

## 2021-05-30 ENCOUNTER — Encounter: Payer: Self-pay | Admitting: Internal Medicine

## 2021-05-30 ENCOUNTER — Other Ambulatory Visit: Payer: Self-pay

## 2021-05-30 ENCOUNTER — Ambulatory Visit (INDEPENDENT_AMBULATORY_CARE_PROVIDER_SITE_OTHER): Payer: Medicare Other | Admitting: Internal Medicine

## 2021-05-30 VITALS — BP 113/41 | HR 79 | Temp 97.3°F | Resp 17 | Ht 63.5 in | Wt 174.2 lb

## 2021-05-30 DIAGNOSIS — R002 Palpitations: Secondary | ICD-10-CM

## 2021-05-30 NOTE — Progress Notes (Signed)
Subjective:    Patient ID: Anita Mathews, female    DOB: 1950-12-17, 70 y.o.   MRN: [id number]  HPI  Pt presents to the clinic today with c/o palpitations. This started about 2 months ago. The last episode occurred while she was laying in bed. She denies associated dizziness, vision changes, shortness of breath, chest pain, sweating or vomiting. She is not feeling overly stressed, overwhelmed or anxious. She only has 1 cup of coffee daily, otherwise does not drink caffeine. She has not recently started on any new medications. She has had this happen in the past and was able to slow her heart rate down with the valsalva maneuver. ECG from 11/2020 reviewed.  Review of Systems     Past Medical History:  Diagnosis Date   Arthritis    Glaucoma     Current Outpatient Medications  Medication Sig Dispense Refill   alendronate (FOSAMAX) 70 MG tablet Take 70 mg by mouth once a week. Take with a full glass of water on an empty stomach.     Biotin 1 MG CAPS Take 1 capsule by mouth daily.     calcium citrate-vitamin D (CITRACAL+D) 315-200 MG-UNIT tablet Take 1 tablet by mouth every morning.     cyanocobalamin 100 MCG tablet      dorzolamide-timolol (COSOPT) 22.3-6.8 MG/ML ophthalmic solution Apply to eye.     ferrous gluconate (FERGON) 324 MG tablet Take 324 mg by mouth every other day.     Omega-3 Fatty Acids (FISH OIL) 1000 MG CAPS Take 1 capsule by mouth daily.     predniSONE (DELTASONE) 10 MG tablet Take 1 tablet (10 mg total) by mouth daily with breakfast. 5 tablet 0   rosuvastatin (CRESTOR) 5 MG tablet Take 5 mg by mouth daily.     Secukinumab (COSENTYX) 150 MG/ML SOSY Inject 150 mg into the skin every 30 (thirty) days.     No current facility-administered medications for this visit.    No Known Allergies  Family History  Problem Relation Age of Onset   Breast cancer Mother 73   Breast cancer Paternal Grandmother     Social History   Socioeconomic History   Marital status:  Divorced    Spouse name: Not on file   Number of children: Not on file   Years of education: Not on file   Highest education level: Not on file  Occupational History   Not on file  Tobacco Use   Smoking status: Never   Smokeless tobacco: Never  Vaping Use   Vaping Use: Never used  Substance and Sexual Activity   Alcohol use: Yes    Comment: occasional   Drug use: Never   Sexual activity: Not on file  Other Topics Concern   Not on file  Social History Narrative   Not on file   Social Determinants of Health   Financial Resource Strain: Not on file  Food Insecurity: Not on file  Transportation Needs: Not on file  Physical Activity: Not on file  Stress: Not on file  Social Connections: Not on file  Intimate Partner Violence: Not on file     Constitutional: Denies fever, malaise, fatigue, headache or abrupt weight changes.  Respiratory: Denies difficulty breathing, shortness of breath, cough or sputum production.   Cardiovascular: Pt reports palpitations. Denies chest pain, chest tightness, or swelling in the hands or feet.  Gastrointestinal: Denies abdominal pain, bloating, constipation, diarrhea or blood in the stool.  Neurological: Denies dizziness, difficulty with memory, difficulty  with speech or problems with balance and coordination.  Psych: Denies anxiety, depression, SI/HI.  No other specific complaints in a complete review of systems (except as listed in HPI above).  Objective:   Physical Exam BP (!) 113/41 (BP Location: Right Arm, Patient Position: Sitting, Cuff Size: Normal)    Pulse 79    Temp (!) 97.3 F (36.3 C) (Temporal)    Resp 17    Ht 5' 3.5" (1.613 m)    Wt 174 lb 3.2 oz (79 kg)    SpO2 100%    BMI 30.37 kg/m   Wt Readings from Last 3 Encounters:  03/26/21 175 lb 6.4 oz (79.6 kg)  03/25/21 178 lb 3.2 oz (80.8 kg)  03/20/21 172 lb 3.2 oz (78.1 kg)    General: Appears her stated age, obese, in NAD. Skin: Warm, dry and intact.  HEENT: Head: normal  shape and size; Eyes: sclera white and EOMs intact;  Neck:  Neck supple, trachea midline. No masses, lumps or thyromegaly present.  Cardiovascular: Normal rate and rhythm. S1,S2 noted.  No murmur, rubs or gallops noted. No JVD or BLE edema. No carotid bruits noted. Pulmonary/Chest: Normal effort and positive vesicular breath sounds. No respiratory distress. No wheezes, rales or ronchi noted.  Musculoskeletal: No difficulty with gait.  Neurological: Alert and oriented.  Psychiatric: Mood and affect normal.  Mildly anxious appearing. Judgment and thought content normal.    BMET    Component Value Date/Time   NA 140 03/20/2021 0934   K 4.7 03/20/2021 0934   CL 102 03/20/2021 0934   CO2 29 03/20/2021 0934   GLUCOSE 94 03/20/2021 0934   BUN 10 03/20/2021 0934   CREATININE 0.90 03/20/2021 0934   CALCIUM 9.8 03/20/2021 0934   GFRNONAA >60 01/24/2018 1056   GFRAA >60 01/24/2018 1056    Lipid Panel     Component Value Date/Time   CHOL 189 03/20/2021 0934   TRIG 70 03/20/2021 0934   HDL 83 03/20/2021 0934   CHOLHDL 2.3 03/20/2021 0934   LDLCALC 90 03/20/2021 0934    CBC    Component Value Date/Time   WBC 5.5 03/20/2021 0934   RBC 4.31 03/20/2021 0934   HGB 13.3 03/20/2021 0934   HCT 40.0 03/20/2021 0934   PLT 304 03/20/2021 0934   MCV 92.8 03/20/2021 0934   MCH 30.9 03/20/2021 0934   MCHC 33.3 03/20/2021 0934   RDW 12.7 03/20/2021 0934   LYMPHSABS 2.2 01/24/2018 1056   MONOABS 0.9 01/24/2018 1056   EOSABS 0.1 01/24/2018 1056   BASOSABS 0.1 01/24/2018 1056    Hgb A1C No results found for: HGBA1C        Assessment & Plan:    Palpitations:  Indication for ECG: Palpitations Interpretation of the ECG: Normal rate and rhythm, some artifact Comparison of ECG: 11/2020 We will check TSH, B met and magnesium Consider referral to cardiology for Holter monitor  We will follow-up after labs with further recommendation and treatment plan Webb Silversmith, NP  This visit  occurred during the SARS-CoV-2 public health emergency.  Safety protocols were in place, including screening questions prior to the visit, additional usage of staff PPE, and extensive cleaning of exam room while observing appropriate contact time as indicated for disinfecting solutions.

## 2021-05-30 NOTE — Patient Instructions (Signed)
Palpitations Palpitations are feelings that your heartbeat is not normal. Your heartbeat may feel like it is: Uneven (irregular). Faster than normal. Fluttering. Skipping a beat. This is usually not a serious problem. However, a doctor will do tests and check your medical history to make sure that you do not have a serious heart problem. Follow these instructions at home: Watch for any changes in your condition. Tell your doctor about any changes. Take these actions to help manage your symptoms: Eating and drinking Follow instructions from your doctor about things to eat and drink. You may be told to avoid these things: Drinks that have caffeine in them, such as coffee, tea, soft drinks, and energy drinks. Chocolate. Alcohol. Diet pills. Lifestyle   Try to lower your stress. These things can help you relax: Yoga. Deep breathing and meditation. Guided imagery. This is using words and images to create positive thoughts. Exercise, including swimming, jogging, and walking. Tell your doctor if you have more abnormal heartbeats when you are active. If you have chest pain or feel short of breath with exercise, do not keep doing the exercise until you are seen by your doctor. Biofeedback. This is using your mind to control things in your body, such as your heartbeat. Get plenty of rest and sleep. Keep a regular bed time. Do not use drugs, such as cocaine or ecstasy. Do not use marijuana. Do not smoke or use any products that contain nicotine or tobacco. If you need help quitting, ask your doctor. General instructions Take over-the-counter and prescription medicines only as told by your doctor. Keep all follow-up visits. You may need more tests if palpitations do not go away or get worse. Contact a doctor if: You keep having fast or uneven heartbeats for a long time. Your symptoms happen more often. Get help right away if: You have chest pain. You feel short of breath. You have a very bad  headache. You feel dizzy. You faint. These symptoms may be an emergency. Get help right away. Call your local emergency services (911 in the [place] Do not wait to see if the symptoms will go away. Do not drive yourself to the hospital. Summary Palpitations are feelings that your heartbeat is uneven or faster than normal. It may feel like your heart is fluttering or skipping a beat. Avoid food and drinks that may cause this condition. These include caffeine, chocolate, and alcohol. Try to lower your stress. Do not smoke or use drugs. Get help right away if you faint, feel dizzy, feel short of breath, have chest pain, or have a very bad headache. This information is not intended to replace advice given to you by your health care provider. Make sure you discuss any questions you have with your health care provider. Document Revised: 10/10/2020 Document Reviewed: 10/10/2020 Elsevier Patient Education  2022 Elsevier Inc.  

## 2021-05-31 LAB — TSH: TSH: 2.35 mIU/L (ref 0.40–4.50)

## 2021-05-31 LAB — BASIC METABOLIC PANEL WITH GFR
BUN: 16 mg/dL (ref 7–25)
CO2: 28 mmol/L (ref 20–32)
Calcium: 10.2 mg/dL (ref 8.6–10.4)
Chloride: 106 mmol/L (ref 98–110)
Creat: 0.9 mg/dL (ref 0.60–1.00)
Glucose, Bld: 120 mg/dL (ref 65–139)
Potassium: 4.1 mmol/L (ref 3.5–5.3)
Sodium: 143 mmol/L (ref 135–146)
eGFR: 69 mL/min/{1.73_m2} (ref >=60)

## 2021-05-31 LAB — MAGNESIUM: Magnesium: 2.2 mg/dL (ref 1.5–2.5)

## 2021-05-31 NOTE — Addendum Note (Signed)
Addended by: Lorre Munroe on: 05/31/2021 09:53 AM   Modules accepted: Orders

## 2021-06-04 ENCOUNTER — Other Ambulatory Visit: Payer: Self-pay | Admitting: Nurse Practitioner

## 2021-06-04 ENCOUNTER — Other Ambulatory Visit: Payer: Self-pay | Admitting: Internal Medicine

## 2021-06-04 DIAGNOSIS — Z1231 Encounter for screening mammogram for malignant neoplasm of breast: Secondary | ICD-10-CM

## 2021-06-06 ENCOUNTER — Emergency Department: Payer: Medicare Other

## 2021-06-06 ENCOUNTER — Other Ambulatory Visit: Payer: Self-pay

## 2021-06-06 DIAGNOSIS — I10 Essential (primary) hypertension: Secondary | ICD-10-CM | POA: Diagnosis not present

## 2021-06-06 DIAGNOSIS — M542 Cervicalgia: Secondary | ICD-10-CM | POA: Insufficient documentation

## 2021-06-06 DIAGNOSIS — R001 Bradycardia, unspecified: Secondary | ICD-10-CM | POA: Diagnosis not present

## 2021-06-06 DIAGNOSIS — R42 Dizziness and giddiness: Secondary | ICD-10-CM | POA: Diagnosis present

## 2021-06-06 DIAGNOSIS — R6889 Other general symptoms and signs: Secondary | ICD-10-CM | POA: Diagnosis not present

## 2021-06-06 DIAGNOSIS — Z743 Need for continuous supervision: Secondary | ICD-10-CM | POA: Diagnosis not present

## 2021-06-06 LAB — BASIC METABOLIC PANEL
Anion gap: 7 (ref 5–15)
BUN: 12 mg/dL (ref 8–23)
CO2: 24 mmol/L (ref 22–32)
Calcium: 9.1 mg/dL (ref 8.9–10.3)
Chloride: 104 mmol/L (ref 98–111)
Creatinine, Ser: 0.77 mg/dL (ref 0.44–1.00)
GFR, Estimated: >60 mL/min (ref >60)
Glucose, Bld: 101 mg/dL — ABNORMAL HIGH (ref 70–99)
Potassium: 3.8 mmol/L (ref 3.5–5.1)
Sodium: 135 mmol/L (ref 135–145)

## 2021-06-06 LAB — URINALYSIS, ROUTINE W REFLEX MICROSCOPIC
Bacteria, UA: NONE SEEN
Bilirubin Urine: NEGATIVE
Glucose, UA: NEGATIVE mg/dL
Hgb urine dipstick: NEGATIVE
Ketones, ur: NEGATIVE mg/dL
Nitrite: NEGATIVE
Protein, ur: NEGATIVE mg/dL
Specific Gravity, Urine: 1.004 — ABNORMAL LOW (ref 1.005–1.030)
pH: 7 (ref 5.0–8.0)

## 2021-06-06 LAB — CBG MONITORING, ED: Glucose-Capillary: 104 mg/dL — ABNORMAL HIGH (ref 70–99)

## 2021-06-06 LAB — CBC
HCT: 37.8 % (ref 36.0–46.0)
Hemoglobin: 12.7 g/dL (ref 12.0–15.0)
MCH: 31 pg (ref 26.0–34.0)
MCHC: 33.6 g/dL (ref 30.0–36.0)
MCV: 92.2 fL (ref 80.0–100.0)
Platelets: 286 10*3/uL (ref 150–400)
RBC: 4.1 MIL/uL (ref 3.87–5.11)
RDW: 12.2 % (ref 11.5–15.5)
WBC: 6.5 10*3/uL (ref 4.0–10.5)
nRBC: 0 % (ref 0.0–0.2)

## 2021-06-06 NOTE — ED Triage Notes (Addendum)
Pt to ED via [facility].  Pt states she was driving and her neck started hurting and then her head felt weird.  She then took her blood pressure and it was 220/83.  Pt states her head and neck still feel weird and her right jaw hurts and she feels flushed.  Pt denies hx of hypertension.  Pt states she feels "woozy" at this time.

## 2021-06-07 ENCOUNTER — Ambulatory Visit (INDEPENDENT_AMBULATORY_CARE_PROVIDER_SITE_OTHER): Admit: 2021-06-07 | Payer: Medicare Other

## 2021-06-07 ENCOUNTER — Emergency Department: Payer: Medicare Other

## 2021-06-07 ENCOUNTER — Ambulatory Visit
Admission: EM | Admit: 2021-06-07 | Discharge: 2021-06-07 | Disposition: A | Discharge status: Home / Self Care | Payer: Medicare Other

## 2021-06-07 ENCOUNTER — Ambulatory Visit: Payer: Self-pay | Admitting: *Deleted

## 2021-06-07 ENCOUNTER — Other Ambulatory Visit: Payer: Self-pay

## 2021-06-07 ENCOUNTER — Telehealth: Payer: Medicare Other | Admitting: Nurse Practitioner

## 2021-06-07 ENCOUNTER — Encounter: Payer: Self-pay | Admitting: Emergency Medicine

## 2021-06-07 ENCOUNTER — Emergency Department
Admission: EM | Admit: 2021-06-07 | Discharge: 2021-06-07 | Disposition: A | Discharge status: Home / Self Care | Payer: Medicare Other | Attending: Emergency Medicine | Admitting: Emergency Medicine

## 2021-06-07 DIAGNOSIS — I10 Essential (primary) hypertension: Secondary | ICD-10-CM | POA: Diagnosis not present

## 2021-06-07 DIAGNOSIS — Z7951 Long term (current) use of inhaled steroids: Secondary | ICD-10-CM | POA: Diagnosis not present

## 2021-06-07 DIAGNOSIS — Z7983 Long term (current) use of bisphosphonates: Secondary | ICD-10-CM | POA: Diagnosis not present

## 2021-06-07 DIAGNOSIS — M47812 Spondylosis without myelopathy or radiculopathy, cervical region: Secondary | ICD-10-CM | POA: Diagnosis not present

## 2021-06-07 DIAGNOSIS — Z79899 Other long term (current) drug therapy: Secondary | ICD-10-CM | POA: Diagnosis not present

## 2021-06-07 DIAGNOSIS — M542 Cervicalgia: Secondary | ICD-10-CM

## 2021-06-07 DIAGNOSIS — R030 Elevated blood-pressure reading, without diagnosis of hypertension: Secondary | ICD-10-CM

## 2021-06-07 DIAGNOSIS — R29818 Other symptoms and signs involving the nervous system: Secondary | ICD-10-CM | POA: Diagnosis not present

## 2021-06-07 DIAGNOSIS — R519 Headache, unspecified: Secondary | ICD-10-CM | POA: Diagnosis not present

## 2021-06-07 DIAGNOSIS — I6529 Occlusion and stenosis of unspecified carotid artery: Secondary | ICD-10-CM | POA: Diagnosis not present

## 2021-06-07 LAB — TROPONIN I (HIGH SENSITIVITY): Troponin I (High Sensitivity): 4 ng/L (ref <18)

## 2021-06-07 MED ORDER — IOHEXOL 350 MG/ML SOLN
75.0000 mL | Freq: Once | INTRAVENOUS | Status: AC | PRN
  Administered 2021-06-07: 75 mL via INTRAVENOUS

## 2021-06-07 NOTE — Telephone Encounter (Signed)
Reason for Disposition  [1] Systolic BP  >= 130 OR Diastolic >= 80 AND [2] not taking BP medications  Answer Assessment - Initial Assessment Questions 1. BLOOD PRESSURE: "What is the blood pressure?" "Did you take at least two measurements 5 minutes apart?"     Went to the ED last night for high BP.    I called 911 last night.     They did tests.  135/60 when I left the ED.  I sent a message via MyChart to Nicki Reaper, NP. 149/75  HR 80.     Shortly after taking it my neck is throbbing and my face is flushed.   No headache.   I'm sitting.   I have a strange sensation in my head a tingling.   No visual changes.  Not dizzy 2. ONSET: "When did you take your blood pressure?"     Just a few minutes ago There's a vein on left side of my neck that is throbbing.   They did a CT scan, blood work and all was normal in the ED last night. 3. HOW: "How did you obtain the blood pressure?" (e.g., visiting nurse, automatic home BP monitor)     *No Answer* 4. HISTORY: "Do you have a history of high blood pressure?"     No medication for high BP.    110/40 when saw Rene Kocher last week.  130/70 my normal BP.   5. MEDICATIONS: "Are you taking any medications for blood pressure?" "Have you missed any doses recently?"     No 6. OTHER SYMPTOMS: "Do you have any symptoms?" (e.g., headache, chest pain, blurred vision, difficulty breathing, weakness)     No 7. PREGNANCY: "Is there any chance you are pregnant?" "When was your last menstrual period?"     *No Answer*  Protocols used: Blood Pressure - High-A-AH  Chief Complaint: elevated BP per pt.  She called 911 last night.   It's 149/75.  Pulse 80.  C/o a vein in left side of neck throbbing.   Seen in ED last night for these complaints.  Requesting to be seen in the office this evening.   Symptoms: throbbing in left vein in neck.   Tingling in head. Frequency: now Pertinent Negatives: Patient denies dizziness, headache, visual changes, speech is clear and appropriate.     Disposition: [x] ED /[] Urgent Care (no appt availability in office) / [] Appointment(In office/virtual)/ []  [facility]/ [] Home Care/ [] Refused Recommended Disposition /[] [facility]/ []  Follow-up with PCP Additional Notes: Pt refusing to return to the ED.     "They didn't do anything".   I sent a high priority note to Medical after calling into the office but did not get an answer.   I again encouraged pt to return to the ED.

## 2021-06-07 NOTE — ED Provider Notes (Signed)
°  [facility]    CSN: [id number]  Patient was discharge from our office at 8:21 PM. Patient expressed frustration about being advised to go to the ER. She refused EMS and alerted [place] that her friend transported her. She first read each page of her discharge instructions, then asked for ice water, then to use the bathroom.   An hour later she was still in the waiting room. We checked on her and again offered to call EMS. She refused.   Around 9:27PM She left our building.    Rushie Chestnut, Cordelia Poche 06/07/21 2129

## 2021-06-07 NOTE — Progress Notes (Signed)
Virtual Visit Consent   Anita Mathews, you are scheduled for a virtual visit with [name], [place], a [facility] provider, today.     Just as with appointments in the office, your consent must be obtained to participate.  Your consent will be active for this visit and any virtual visit you may have with one of our providers in the next 365 days.     If you have a MyChart account, a copy of this consent can be sent to you electronically.  All virtual visits are billed to your insurance company just like a traditional visit in the office.    As this is a virtual visit, video technology does not allow for your provider to perform a traditional examination.  This may limit your provider's ability to fully assess your condition.  If your provider identifies any concerns that need to be evaluated in person or the need to arrange testing (such as labs, EKG, etc.), we will make arrangements to do so.     Although advances in technology are sophisticated, we cannot ensure that it will always work on either your end or our end.  If the connection with a video visit is poor, the visit may have to be switched to a telephone visit.  With either a video or telephone visit, we are not always able to ensure that we have a secure connection.     I need to obtain your verbal consent now.   Are you willing to proceed with your visit today? YES   LAIKLYNN TULA has provided verbal consent on 06/07/2021 for a virtual visit (video or telephone).   [name], FNP   Date: 06/07/2021 6:25 PM   Virtual Visit via Video Note   I, [name], connected with Anita Mathews ([id number], 12/29/1950) on 06/07/21 at  6:45 PM EST by a video-enabled telemedicine application and verified that I am speaking with the correct person using two identifiers.  Location: Patient: Virtual Visit Location Patient: Home Provider: Virtual Visit Location Provider: Mobile   I discussed the limitations  of evaluation and management by telemedicine and the availability of in person appointments. The patient expressed understanding and agreed to proceed.    History of Present Illness: Anita Mathews is a 71 y.o. who identifies as a female who was assigned female at birth, and is being seen today for hypertension.  HPI: Hypertension This is a new problem. The current episode started yesterday. The problem has been gradually worsening since onset. The problem is uncontrolled. Pertinent negatives include no anxiety, blurred vision, chest pain, peripheral edema or shortness of breath. There are no known risk factors for coronary artery disease. Past treatments include nothing. There are no compliance problems.   Blood pressure was 200/80. She called 911 and rescue squad took her to hospital. They did blood test, CT scan and ekg. Were all normal. By this morning blood pressure was back to normal. Was discharged home on no meds. 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 AT 2pm blood pressure was 149/75 and heart rate was 80 at rest. She then started feeling that face was flushing and heart started racing and that lasted intermittently for over a few hours. SHe layed down and that made her feel better. Now her blood pressure is 198/90 and she said her head feels funny. No weakness  Review of Systems  Eyes:  Negative for blurred vision.  Respiratory:  Negative  for shortness of breath.   Cardiovascular:  Negative for chest pain.   Problems:  Patient Active Problem List   Diagnosis Date Noted   Psoriatic arthritis ([place]) 02/07/2021   Osteoarthritis 02/07/2021   HLD (hyperlipidemia) 02/07/2021   Osteoporosis 02/07/2021   Varicose veins of both lower extremities with pain 02/07/2021   Class 1 obesity due to excess calories with serious comorbidity and body mass index (BMI) of 30.0 to 30.9 in adult 02/07/2021   Primary osteoarthritis of left shoulder 01/17/2015    Allergies: No  Known Allergies Medications:  Current Outpatient Medications:    alendronate (FOSAMAX) 70 MG tablet, Take 70 mg by mouth once a week. Take with a full glass of water on an empty stomach., Disp: , Rfl:    Biotin 1 MG CAPS, Take 1 capsule by mouth daily., Disp: , Rfl:    calcium citrate-vitamin D (CITRACAL+D) 315-200 MG-UNIT tablet, Take 1 tablet by mouth every morning., Disp: , Rfl:    cyanocobalamin 100 MCG tablet, , Disp: , Rfl:    dorzolamide-timolol (COSOPT) 22.3-6.8 MG/ML ophthalmic solution, Apply to eye., Disp: , Rfl:    ferrous gluconate (FERGON) 324 MG tablet, Take 324 mg by mouth every other day., Disp: , Rfl:    Omega-3 Fatty Acids (FISH OIL) 1000 MG CAPS, Take 1 capsule by mouth daily., Disp: , Rfl:    rosuvastatin (CRESTOR) 5 MG tablet, Take 5 mg by mouth daily., Disp: , Rfl:    Secukinumab (COSENTYX) 150 MG/ML SOSY, Inject 150 mg into the skin every 30 (thirty) days., Disp: , Rfl:   Observations/Objective: Patient is well-developed, well-nourished in no acute distress.  Resting comfortably  at home.  Head is normocephalic, atraumatic.  No labored breathing.  Speech is clear and coherent with logical content.  Patient is alert and oriented at baseline.  Face flushed No neuro deficits- no facial drooping , facial unequal facial movement  Assessment and Plan:  Anita Mathews in today with chief complaint of hypertension  1. Hypertension, unspecified type Recommended going back to hospital Patient lives alone  And I am afriad to over treat and cause her to be dizzy or fall. I recommended her not driving herself.     Follow Up Instructions: I discussed the assessment and treatment plan with the patient. The patient was provided an opportunity to ask questions and all were answered. The patient agreed with the plan and demonstrated an understanding of the instructions.  A copy of instructions were sent to the patient via MyChart.  The patient was advised to call back  or seek an in-person evaluation if the symptoms worsen or if the condition fails to improve as anticipated.  Time:  I spent 15 minutes with the patient via telehealth technology discussing the above problems/concerns.    [name], FNP

## 2021-06-07 NOTE — ED Provider Notes (Signed)
[facility]    CSN: [id number] Arrival date & time:         History   Chief Complaint High Blood Pressure    HPI Anita Mathews is a 71 y.o. female.   HPI  Elevated Blood Pressure: Patient was seen at [facility] yesterday for neck pain.  Prior to going to the emergency room it reports that her blood pressure was 220/83 and was 192/87 on her first check in the emergency room.  In the emergency room she had a labs completed including CBG of 104, troponin of 4, nonacute appearing UA, CBC which was normal.  Once her pain resolved and her blood pressure was rechecked it was 135/57 which is more similar to patient's normal range.  She also had negative CT scan and nonconcerning EKG.  She has no history of essential hypertension.  She then had a video visit a few minutes ago at which she told them that around 2 PM she checked her blood pressure and it was 149/75.  She then rechecked her blood pressure and it was 190/90.  According to this note they advised her to go to the hospital.  She arrived to our clinic stating that she did not see the point in going back to the emergency department as her testing was essentially normal.  She does continue to have symptoms of headache and flushed feeling of her face.  She denies any chest pain, shortness of breath, peripheral edema.  No strokelike symptoms.  Past Medical History:  Diagnosis Date   Arthritis    Glaucoma     Patient Active Problem List   Diagnosis Date Noted   Psoriatic arthritis ([place]) 02/07/2021   Osteoarthritis 02/07/2021   HLD (hyperlipidemia) 02/07/2021   Osteoporosis 02/07/2021   Varicose veins of both lower extremities with pain 02/07/2021   Class 1 obesity due to excess calories with serious comorbidity and body mass index (BMI) of 30.0 to 30.9 in adult 02/07/2021   Primary osteoarthritis of left shoulder 01/17/2015    Past Surgical History:  Procedure Laterality Date   CESAREAN SECTION      EYE SURGERY     SHOULDER SURGERY Left     OB History   No obstetric history on file.      Home Medications    Prior to Admission medications   Medication Sig Start Date End Date Taking? Authorizing Provider  alendronate (FOSAMAX) 70 MG tablet Take 70 mg by mouth once a week. Take with a full glass of water on an empty stomach.    [provider]  Biotin 1 MG CAPS Take 1 capsule by mouth daily.    [provider]  calcium citrate-vitamin D (CITRACAL+D) 315-200 MG-UNIT tablet Take 1 tablet by mouth every morning.    [provider]  cyanocobalamin 100 MCG tablet  06/02/18   [provider]  dorzolamide-timolol (COSOPT) 22.3-6.8 MG/ML ophthalmic solution Apply to eye. 04/18/19   [provider]  ferrous gluconate (FERGON) 324 MG tablet Take 324 mg by mouth every other day. 05/13/16   [provider]  Omega-3 Fatty Acids (FISH OIL) 1000 MG CAPS Take 1 capsule by mouth daily.    [provider]  rosuvastatin (CRESTOR) 5 MG tablet Take 5 mg by mouth daily. 04/15/20   [provider]  Secukinumab (COSENTYX) 150 MG/ML SOSY Inject 150 mg into the skin every 30 (thirty) days.    [provider]    Family History Family  History  Problem Relation Age of Onset   Breast cancer Mother 50   Breast cancer Paternal Grandmother     Social History Social History   Tobacco Use   Smoking status: Never   Smokeless tobacco: Never  Vaping Use   Vaping Use: Never used  Substance Use Topics   Alcohol use: Yes    Comment: occasional   Drug use: Never     Allergies   Patient has no known allergies.   Review of Systems Review of Systems  As stated above in HPI Physical Exam Triage Vital Signs ED Triage Vitals  Enc Vitals Group     BP      Pulse      Resp      Temp      Temp src      SpO2      Weight      Height      Head Circumference      Peak Flow      Pain Score      Pain Loc      Pain Edu?       Excl. in GC?    No data found.  Updated Vital Signs Please see chart as they are not populating   Physical Exam Vitals and nursing note reviewed.  Constitutional:      General: She is not in acute distress.    Appearance: Normal appearance. She is not ill-appearing, toxic-appearing or diaphoretic.  Eyes:     Extraocular Movements: Extraocular movements intact.     Pupils: Pupils are equal, round, and reactive to light.  Neck:     Vascular: No carotid bruit.  Cardiovascular:     Rate and Rhythm: Normal rate and regular rhythm.     Heart sounds: Normal heart sounds.  Pulmonary:     Effort: Pulmonary effort is normal.     Breath sounds: Normal breath sounds.  Musculoskeletal:     Cervical back: Neck supple.  Skin:    General: Skin is warm.     Capillary Refill: Capillary refill takes less than 2 seconds.     Coloration: Skin is not pale.  Neurological:     General: No focal deficit present.     Mental Status: She is alert and oriented to person, place, and time. Mental status is at baseline.     Cranial Nerves: No cranial nerve deficit.     Sensory: No sensory deficit.     Motor: No weakness.     Coordination: Coordination normal.     Gait: Gait normal.     Deep Tendon Reflexes: Reflexes normal.  Psychiatric:        Mood and Affect: Mood normal.        Behavior: Behavior normal.        Thought Content: Thought content normal.        Judgment: Judgment normal.     UC Treatments / Results  Labs (all labs ordered are listed, but only abnormal results are displayed) Labs Reviewed - No data to display  EKG   Radiology CT ANGIO HEAD NECK W WO CM  Result Date: 06/07/2021 CLINICAL DATA:  Initial evaluation for neuro deficit, stroke suspected. EXAM: CT ANGIOGRAPHY HEAD AND NECK TECHNIQUE: Multidetector CT imaging of the head and neck was performed using the standard protocol during bolus administration of intravenous contrast. Multiplanar CT image reconstructions  and MIPs were obtained to evaluate the vascular anatomy. Carotid stenosis measurements (when applicable) are obtained utilizing  NASCET criteria, using the distal internal carotid diameter as the denominator. CONTRAST:  50mL OMNIPAQUE IOHEXOL 350 MG/ML SOLN COMPARISON:  Previous study from 08/05/2017. FINDINGS: CT HEAD FINDINGS Brain: Cerebral volume within normal limits for age. No acute intracranial hemorrhage. No acute large vessel territory infarct. No mass lesion, midline shift or mass effect. No hydrocephalus or extra-axial fluid collection. Vascular: No hyperdense vessel. Scattered vascular calcifications noted within the carotid siphons. Skull: Scalp soft tissues and calvarium within normal limits. Sinuses: Visualized paranasal sinuses are clear. Small right greater than left mastoid effusions. Negative nasopharynx. Orbits: Visualized globes and orbital soft tissues within normal limits. Review of the MIP images confirms the above findings CTA NECK FINDINGS Aortic arch: Visualized aortic arch normal caliber with normal branch pattern. No stenosis about the origin the great vessels. Right carotid system: Right common and internal carotid arteries widely patent without stenosis, dissection or occlusion. Left carotid system: Left common and internal carotid arteries widely patent without stenosis, dissection or occlusion. Vertebral arteries: Both vertebral arteries arise from the subclavian arteries. No proximal subclavian artery stenosis. Both vertebral arteries widely patent without stenosis, dissection or occlusion. Skeleton: No discrete or worrisome osseous lesions. Mild spondylosis present at C5-6 and C6-7. Other neck: Unremarkable. Upper chest: Unremarkable. Review of the MIP images confirms the above findings CTA HEAD FINDINGS Anterior circulation: Petrous segments patent bilaterally. Mild atheromatous change within the carotid siphons without hemodynamically significant stenosis. A1 segments widely  patent. Normal anterior communicating artery complex. Anterior cerebral arteries patent without stenosis. Normal in stenosis or occlusion. Normal MCA bifurcations. Distal MCA branches well perfused and symmetric. Posterior circulation: Vertebral arteries patent the vertebrobasilar junction without significant stenosis. Left vertebral artery dominant. Both PICA patent. Basilar patent to its distal aspect without stenosis. Superior cerebral arteries patent bilaterally. Both PCAs well perfused and widely patent distal aspects. Venous sinuses: Patent allowing for timing the contrast bolus. Anatomic variants: None significant.  No aneurysm. Review of the MIP images confirms the above findings IMPRESSION: 1. Negative CTA of the head and neck. No large vessel occlusion, hemodynamically significant stenosis, or other acute vascular abnormality. 2. No other acute intracranial abnormality. Electronically Signed   By: Jeannine Boga M.D.   On: 06/07/2021 04:05    Procedures Procedures (including critical care time)  Medications Ordered in UC Medications - No data to display  Initial Impression / Assessment and Plan / UC Course  I have reviewed the triage vital signs and the nursing notes.  Pertinent labs & imaging results that were available during my care of the patient were reviewed by me and considered in my medical decision making (see chart for details).     New.  Initial vital in office was 184/89 with a pulse rate of 74 and a respiratory rate of 14.  On recheck her blood pressure was 195/81 and this is with her feet being flat on the floor uncrossed, her arm without any clothing on it using appropriate sized cuff and with her not speaking during blood pressure check.  I have discussed with patient that she does need to go back to the emergency department for further work-up and monitoring.  I discussed why starting on a medication outpatient would not be appropriate at this time as it would be  unsafe.  She is agreeable and prefers private vehicle transport to EMS. Final Clinical Impressions(s) / UC Diagnoses   Final diagnoses:  None   Discharge Instructions   None    ED Prescriptions   None  PDMP not reviewed this encounter.   Hughie Closs, Hershal Coria 06/07/21 2004

## 2021-06-07 NOTE — ED Provider Notes (Signed)
[facility] Provider Note    Event Date/Time   First MD Initiated Contact with Patient 06/07/21 [phone]     (approximate)   History   Hypertension   HPI  Anita Mathews is a 71 y.o. female with a past medical history of arthritis, glaucoma, psoriatic arthritis, and glaucoma with currently dilated right eye who presents for assessment of some left-sided neck pain she described as throbbing that occurred last night.  He was driving when this occurred.  She states she took her blood pressure is 220/83.  She states she does not have a history of high blood pressure and does not take any medications for this.  She states she felt a little woozy for a few minutes but this lasted less than an hour.  She states she currently feels back to baseline here and has no residual symptoms.  No prior similar episodes.  No other headache, vertigo, vision changes, chest pain, cough, shortness of breath, abdominal pain, back pain, extremity weakness numbness or tingling or other acute complaints.  Patient denies any jaw pain.  States it was all centered on the left side of her neck.  She denies tobacco abuse and states he only drinks 1-2 alcoholic beverages occasionally.  Denies illicit drug use.  No other acute concerns at this time.      Physical Exam  Triage Vital Signs: ED Triage Vitals  Enc Vitals Group     BP 06/06/21 2240 (!) 192/87     Pulse Rate 06/06/21 2240 65     Resp 06/06/21 2240 18     Temp 06/06/21 2240 97.7 F (36.5 C)     Temp Source 06/06/21 2240 Oral     SpO2 06/06/21 2240 99 %     Weight 06/06/21 2238 170 lb (77.1 kg)     Height 06/06/21 2238 5\' 3"  (1.6 m)     Head Circumference --      Peak Flow --      Pain Score 06/06/21 2237 0     Pain Loc --      Pain Edu? --      Excl. in GC? --     Most recent vital signs: Vitals:   06/06/21 2240 06/07/21 0652  BP: (!) 192/87 (!) 135/57  Pulse: 65 60  Resp: 18 16  Temp: 97.7 F (36.5 C)   SpO2: 99% 99%     General: Awake, no distress.  CV:  Good peripheral perfusion.  2+ radial pulses.  No murmurs rubs or gallops. Resp:  Normal effort.  Clear bilaterally. Abd:  No distention.  Soft throughout. Other:  Cranial nerves II through XII grossly intact.  No pronator drift.  No finger dysmetria.  Symmetric 5/5 strength of all extremities.  Sensation intact to light touch in all extremities.  Unremarkable unassisted gait.  There is no focal tenderness or overlying skin changes over the left side of the neck.  Patient is forage motion of her neck.  Remainder of neck face and scalp is unremarkable.  No tenderness over temporal area or left TMJ joint.   ED Results / Procedures / Treatments  Labs (all labs ordered are listed, but only abnormal results are displayed) Labs Reviewed  BASIC METABOLIC PANEL - Abnormal; Notable for the following components:      Result Value   Glucose, Bld 101 (*)    All other components within normal limits  URINALYSIS, ROUTINE W REFLEX MICROSCOPIC - Abnormal; Notable for the following components:  Color, Urine STRAW (*)    APPearance CLEAR (*)    Specific Gravity, Urine 1.004 (*)    Leukocytes,Ua SMALL (*)    All other components within normal limits  CBG MONITORING, ED - Abnormal; Notable for the following components:   Glucose-Capillary 104 (*)    All other components within normal limits  CBC  TROPONIN I (HIGH SENSITIVITY)     EKG  ECG is remarkable for sinus bradycardia with a ventricular rate of 59, normal axis, otherwise unremarkable intervals with nonspecific change in ST segment of V3 and lead III without other clearance of acute ischemia or significant arrhythmia.   RADIOLOGY  CTA head and neck shows no large vessel occlusion, stenosis, hemorrhage, ischemia, mass-effect, edema or other clear acute intracranial or cervical process.   PROCEDURES:  Critical Care performed: No  Procedures    MEDICATIONS ORDERED IN ED: Medications  iohexol  (OMNIPAQUE) 350 MG/ML injection 75 mL (75 mLs Intravenous Contrast Given 06/07/21 0233)     IMPRESSION / MDM / ASSESSMENT AND PLAN / ED COURSE  I reviewed the triage vital signs and the nursing notes.                              Differential diagnosis includes, but is not limited to hypertensive urgency, dissection, MSK, atypical presentation for ACS, and cellulitis.  There is no history or exam features to suggest preceding trauma.  No focal deficits to suggest CVA.  Patient is not appear septic or meningitic.  On reassessment when brought back to the ED evaluation room patient's BP is 135/57.  I did review patient's most recent PCP note from 12/29 that made no mention of prior similar episodes or history of hypertension.  ECG is remarkable for sinus bradycardia with a ventricular rate of 59, normal axis, otherwise unremarkable intervals with nonspecific change in ST segment of V3 and lead III without other clearance of acute ischemia or significant arrhythmia.  Given absence of any chest pain and nonelevated troponin I have a low suspicion for ACS at this time.  CTA head and neck shows no large vessel occlusion, stenosis, hemorrhage, ischemia, mass-effect, edema or other clear acute intracranial or cervical process.  BMP shows no significant electrolyte or metabolic derangements.  CBC shows no leukocytosis or acute anemia.  UA obtained in triage shows some small leuks esterase but otherwise no WBCs, nitrates, bacteria or other findings to suggest cystitis at this time.  Given symptoms seem to be temporally related to very high blood pressures which seem to have resolved on her own patient is now symptomatic and complex requires emergent imaging.  Unclear etiology for this paroxysmal hypertension.  Low suspicion for alcohol or drug withdrawal, thyrotoxicosis or other immediate life-threatening process.  Given stable vitals otherwise reassuring exam and work-up I think she is stable for discharge  to an outpatient evaluation with PCP.  Discharged in stable condition.  Strict return cautions advised and discussed.      FINAL CLINICAL IMPRESSION(S) / ED DIAGNOSES   Final diagnoses:  Neck pain  Hypertension, unspecified type     Rx / DC Orders   ED Discharge Orders     None        Note:  This document was prepared using Dragon voice recognition software and may include unintentional dictation errors.   Gilles Chiquito, MD 06/07/21 [phone]

## 2021-06-07 NOTE — ED Triage Notes (Signed)
Patient states that she was seen in the ED for evaluation of high blood pressure and head pressure on 06/06/20.  Patient was discharged from ED this morning.  Patient states that she had a virtual visit at her PCP office this evening around 6:45 today.  Patient was advised to go back to the ED. Patient states that she did not want to go back to the ED because " they already did all the tests there."  Patient wanting to know if there is medicine she could take for her hypertension.  Patient denies chest pain or SOB.

## 2021-06-09 ENCOUNTER — Encounter: Payer: Self-pay | Admitting: Internal Medicine

## 2021-06-10 ENCOUNTER — Encounter: Payer: Self-pay | Admitting: Internal Medicine

## 2021-06-10 DIAGNOSIS — H401122 Primary open-angle glaucoma, left eye, moderate stage: Secondary | ICD-10-CM | POA: Diagnosis not present

## 2021-06-10 DIAGNOSIS — Z961 Presence of intraocular lens: Secondary | ICD-10-CM | POA: Diagnosis not present

## 2021-06-10 DIAGNOSIS — H04129 Dry eye syndrome of unspecified lacrimal gland: Secondary | ICD-10-CM | POA: Diagnosis not present

## 2021-06-10 DIAGNOSIS — H40001 Preglaucoma, unspecified, right eye: Secondary | ICD-10-CM | POA: Diagnosis not present

## 2021-06-10 MED ORDER — ALENDRONATE SODIUM 70 MG PO TABS: 70.0000 mg | ORAL_TABLET | ORAL | 3 refills | Status: DC

## 2021-06-10 MED ORDER — ROSUVASTATIN CALCIUM 5 MG PO TABS: 5.0000 mg | ORAL_TABLET | Freq: Every day | ORAL | 1 refills | Status: DC

## 2021-06-11 ENCOUNTER — Encounter: Payer: Self-pay | Admitting: Internal Medicine

## 2021-06-11 ENCOUNTER — Ambulatory Visit (INDEPENDENT_AMBULATORY_CARE_PROVIDER_SITE_OTHER): Payer: Medicare Other | Admitting: Internal Medicine

## 2021-06-11 ENCOUNTER — Other Ambulatory Visit: Payer: Self-pay

## 2021-06-11 VITALS — BP 132/84 | HR 58 | Temp 97.6°F | Ht 64.0 in | Wt 171.0 lb

## 2021-06-11 DIAGNOSIS — R42 Dizziness and giddiness: Secondary | ICD-10-CM | POA: Diagnosis not present

## 2021-06-11 DIAGNOSIS — M542 Cervicalgia: Secondary | ICD-10-CM

## 2021-06-11 DIAGNOSIS — I10 Essential (primary) hypertension: Secondary | ICD-10-CM | POA: Diagnosis not present

## 2021-06-11 DIAGNOSIS — E663 Overweight: Secondary | ICD-10-CM | POA: Diagnosis not present

## 2021-06-11 DIAGNOSIS — Z6829 Body mass index (BMI) 29.0-29.9, adult: Secondary | ICD-10-CM

## 2021-06-11 NOTE — Patient Instructions (Signed)

## 2021-06-11 NOTE — Assessment & Plan Note (Signed)
Encouraged diet and exercise for weight loss ?

## 2021-06-11 NOTE — Progress Notes (Signed)
Subjective:    Patient ID: Anita Mathews, female    DOB: 1951-02-22, 71 y.o.   MRN: [id number]  HPI  Patient presents to clinic today for multiple ER follow-up.  She presented to [facility] 1/6 after complaint of left-sided neck pain which she described as throbbing with associated dizziness and elevated blood pressure of 220/83.  Her EKG showed sinus bradycardia.  Her labs were unremarkable.  Her CTA head and neck was negative for acute findings.  Her blood pressure came down to 135/57 without intervention.  She had no prior history of HTN, was discharged and advised to follow-up with her PCP.  She reports she continues to feel unwell so she went to urgent care 1/6 for the same.  They immediately sent her to the ER for further evaluation.  She went to [facility] ER.  They did not repeat any imaging or labs at that time.  They started her on Lisinopril 5 mg daily, discharged home advised to follow-up with her PCP.  Since discharge, she reports she is feeling better.  She has not had any recurrence of left-sided neck pain.  Her BP today is 162/60.  She is taking Lisinopril as prescribed.  Review of Systems  Past Medical History:  Diagnosis Date   Arthritis    Glaucoma     Current Outpatient Medications  Medication Sig Dispense Refill   alendronate (FOSAMAX) 70 MG tablet Take 1 tablet (70 mg total) by mouth once a week. Take with a full glass of water on an empty stomach. 12 tablet 3   Biotin 1 MG CAPS Take 1 capsule by mouth daily.     calcium citrate-vitamin D (CITRACAL+D) 315-200 MG-UNIT tablet Take 1 tablet by mouth every morning.     cyanocobalamin 100 MCG tablet      dorzolamide-timolol (COSOPT) 22.3-6.8 MG/ML ophthalmic solution Apply to eye.     ferrous gluconate (FERGON) 324 MG tablet Take 324 mg by mouth every other day.     lisinopril (ZESTRIL) 5 MG tablet Take 5 mg by mouth daily.     Omega-3 Fatty Acids (FISH OIL) 1000 MG CAPS Take 1 capsule by mouth daily.     rosuvastatin (CRESTOR) 5  MG tablet Take 1 tablet (5 mg total) by mouth daily. 90 tablet 1   Secukinumab (COSENTYX) 150 MG/ML SOSY Inject 150 mg into the skin every 30 (thirty) days.     No current facility-administered medications for this visit.    No Known Allergies  Family History  Problem Relation Age of Onset   Breast cancer Mother 64   Breast cancer Paternal Grandmother     Social History   Socioeconomic History   Marital status: Divorced    Spouse name: Not on file   Number of children: Not on file   Years of education: Not on file   Highest education level: Not on file  Occupational History   Not on file  Tobacco Use   Smoking status: Never   Smokeless tobacco: Never  Vaping Use   Vaping Use: Never used  Substance and Sexual Activity   Alcohol use: Yes    Comment: occasional   Drug use: Never   Sexual activity: Not on file  Other Topics Concern   Not on file  Social History Narrative   Not on file   Social Determinants of Health   Financial Resource Strain: Not on file  Food Insecurity: Not on file  Transportation Needs: Not on file  Physical Activity: Not  on file  Stress: Not on file  Social Connections: Not on file  Intimate Partner Violence: Not on file     Constitutional: Denies fever, malaise, fatigue, headache or abrupt weight changes.  HEENT: Denies eye pain, eye redness, ear pain, ringing in the ears, wax buildup, runny nose, nasal congestion, bloody nose, or sore throat. Respiratory: Denies difficulty breathing, shortness of breath, cough or sputum production.   Cardiovascular: Pt reports intermittent left upper chest pain (thinks it may be anxiety). Denies chest tightness, palpitations or swelling in the hands or feet.  Gastrointestinal: Denies abdominal pain, bloating, constipation, diarrhea or blood in the stool.  Musculoskeletal: Denies decrease in range of motion, difficulty with gait, muscle pain or joint pain and swelling.  Skin: Denies redness, rashes, lesions  or ulcercations.  Neurological: Denies dizziness, difficulty with memory, difficulty with speech or problems with balance and coordination.  Psych: Pt reports slight anxiety. Denies depression, SI/HI.  No other specific complaints in a complete review of systems (except as listed in HPI above).     Objective:   Physical Exam  BP (!) 162/60    Pulse (!) 58    Temp 97.6 F (36.4 C) (Oral)    Ht 5\' 4"  (1.626 m)    Wt 171 lb (77.6 kg)    SpO2 100%    BMI 29.35 kg/m  Wt Readings from Last 3 Encounters:  06/11/21 171 lb (77.6 kg)  06/07/21 170 lb (77.1 kg)  06/06/21 170 lb (77.1 kg)    General: Appears her stated age, overweight, in NAD. Skin: Warm, dry and intact.  HEENT: Head: normal shape and size; Eyes: sclera white and EOMs intact; Cardiovascular: Bradycardic with normal rhythm. S1,S2 noted.  No murmur, rubs or gallops noted. No JVD or BLE edema. No carotid bruits noted. Pulmonary/Chest: Normal effort and positive vesicular breath sounds. No respiratory distress. No wheezes, rales or ronchi noted.  Musculoskeletal: No difficulty with gait.  Neurological: Alert and oriented. Coordination normal.  Psychiatric: Mood and affect normal. Behavior is normal. Judgment and thought content normal.     BMET    Component Value Date/Time   NA 135 06/06/2021 2244   K 3.8 06/06/2021 2244   CL 104 06/06/2021 2244   CO2 24 06/06/2021 2244   GLUCOSE 101 (H) 06/06/2021 2244   BUN 12 06/06/2021 2244   CREATININE 0.77 06/06/2021 2244   CREATININE 0.90 05/30/2021 1532   CALCIUM 9.1 06/06/2021 2244   GFRNONAA >60 06/06/2021 2244   GFRAA >60 01/24/2018 1056    Lipid Panel     Component Value Date/Time   CHOL 189 03/20/2021 0934   TRIG 70 03/20/2021 0934   HDL 83 03/20/2021 0934   CHOLHDL 2.3 03/20/2021 0934   LDLCALC 90 03/20/2021 0934    CBC    Component Value Date/Time   WBC 6.5 06/06/2021 2244   RBC 4.10 06/06/2021 2244   HGB 12.7 06/06/2021 2244   HCT 37.8 06/06/2021 2244    PLT 286 06/06/2021 2244   MCV 92.2 06/06/2021 2244   MCH 31.0 06/06/2021 2244   MCHC 33.6 06/06/2021 2244   RDW 12.2 06/06/2021 2244   LYMPHSABS 2.2 01/24/2018 1056   MONOABS 0.9 01/24/2018 1056   EOSABS 0.1 01/24/2018 1056   BASOSABS 0.1 01/24/2018 1056    Hgb A1C No results found for: HGBA1C         Assessment & Plan:   ER  Follow Up for Left Side Neck Pain, Dizziness, HTN:  ER notes, labs  and imaging reviewed BP recheck 132/84 Will increase Lisinopril to 10 mg daily Reinforced DASH diet and exercise for weight loss  RTC in 1 week for follow up HTN, bring blood pressure cuff with you Webb Silversmith, NP This visit occurred during the SARS-CoV-2 public health emergency.  Safety protocols were in place, including screening questions prior to the visit, additional usage of staff PPE, and extensive cleaning of exam room while observing appropriate contact time as indicated for disinfecting solutions.

## 2021-06-12 ENCOUNTER — Ambulatory Visit: Payer: Medicare Other | Admitting: Internal Medicine

## 2021-06-18 ENCOUNTER — Encounter: Payer: Self-pay | Admitting: Internal Medicine

## 2021-06-18 ENCOUNTER — Ambulatory Visit: Payer: Medicare Other | Admitting: Internal Medicine

## 2021-06-18 NOTE — Progress Notes (Deleted)
Subjective:    Patient ID: Anita Mathews, female    DOB: 06/18/1950, 71 y.o.   MRN: [id number]  HPI  Pt presents to the clinic today for 1 week follow up of HTN.  At her last visit her Lisinopril was increased to 10 mg daily.  She has been taking the medication as prescribed and denies adverse side effects.  ECG from 06/2021 reviewed.  Review of Systems     Past Medical History:  Diagnosis Date   Arthritis    Glaucoma     Current Outpatient Medications  Medication Sig Dispense Refill   alendronate (FOSAMAX) 70 MG tablet Take 1 tablet (70 mg total) by mouth once a week. Take with a full glass of water on an empty stomach. 12 tablet 3   Biotin 1 MG CAPS Take 1 capsule by mouth daily.     calcium citrate-vitamin D (CITRACAL+D) 315-200 MG-UNIT tablet Take 1 tablet by mouth every morning.     cyanocobalamin 100 MCG tablet      dorzolamide-timolol (COSOPT) 22.3-6.8 MG/ML ophthalmic solution Apply to eye.     ferrous gluconate (FERGON) 324 MG tablet Take 324 mg by mouth every other day.     lisinopril (ZESTRIL) 5 MG tablet Take 10 mg by mouth daily.     Omega-3 Fatty Acids (FISH OIL) 1000 MG CAPS Take 1 capsule by mouth daily.     rosuvastatin (CRESTOR) 5 MG tablet Take 1 tablet (5 mg total) by mouth daily. 90 tablet 1   Secukinumab (COSENTYX) 150 MG/ML SOSY Inject 150 mg into the skin every 30 (thirty) days.     No current facility-administered medications for this visit.    No Known Allergies  Family History  Problem Relation Age of Onset   Breast cancer Mother 23   Breast cancer Paternal Grandmother     Social History   Socioeconomic History   Marital status: Divorced    Spouse name: Not on file   Number of children: Not on file   Years of education: Not on file   Highest education level: Not on file  Occupational History   Not on file  Tobacco Use   Smoking status: Never   Smokeless tobacco: Never  Vaping Use   Vaping Use: Never used  Substance and Sexual  Activity   Alcohol use: Yes    Comment: occasional   Drug use: Never   Sexual activity: Not on file  Other Topics Concern   Not on file  Social History Narrative   Not on file   Social Determinants of Health   Financial Resource Strain: Not on file  Food Insecurity: Not on file  Transportation Needs: Not on file  Physical Activity: Not on file  Stress: Not on file  Social Connections: Not on file  Intimate Partner Violence: Not on file     Constitutional: Denies fever, malaise, fatigue, headache or abrupt weight changes.  HEENT: Denies eye pain, eye redness, ear pain, ringing in the ears, wax buildup, runny nose, nasal congestion, bloody nose, or sore throat. Respiratory: Denies difficulty breathing, shortness of breath, cough or sputum production.   Cardiovascular: Denies chest pain, chest tightness, palpitations or swelling in the hands or feet.  Gastrointestinal: Denies abdominal pain, bloating, constipation, diarrhea or blood in the stool.  GU: Denies urgency, frequency, pain with urination, burning sensation, blood in urine, odor or discharge. Musculoskeletal: Denies decrease in range of motion, difficulty with gait, muscle pain or joint pain and swelling.  Skin:  Denies redness, rashes, lesions or ulcercations.  Neurological: Denies dizziness, difficulty with memory, difficulty with speech or problems with balance and coordination.  Psych: Denies anxiety, depression, SI/HI.  No other specific complaints in a complete review of systems (except as listed in HPI above).  Objective:   Physical Exam   There were no vitals taken for this visit. Wt Readings from Last 3 Encounters:  06/11/21 171 lb (77.6 kg)  06/07/21 170 lb (77.1 kg)  06/06/21 170 lb (77.1 kg)    General: Appears their stated age, well developed, well nourished in NAD. Skin: Warm, dry and intact. No rashes, lesions or ulcerations noted. HEENT: Head: normal shape and size; Eyes: sclera white, no icterus,  conjunctiva pink, PERRLA and EOMs intact; Ears: Tm's gray and intact, normal light reflex; Nose: mucosa pink and moist, septum midline; Throat/Mouth: Teeth present, mucosa pink and moist, no exudate, lesions or ulcerations noted.  Neck:  Neck supple, trachea midline. No masses, lumps or thyromegaly present.  Cardiovascular: Normal rate and rhythm. S1,S2 noted.  No murmur, rubs or gallops noted. No JVD or BLE edema. No carotid bruits noted. Pulmonary/Chest: Normal effort and positive vesicular breath sounds. No respiratory distress. No wheezes, rales or ronchi noted.  Abdomen: Soft and nontender. Normal bowel sounds. No distention or masses noted. Liver, spleen and kidneys non palpable. Musculoskeletal: Normal range of motion. No signs of joint swelling. No difficulty with gait.  Neurological: Alert and oriented. Cranial nerves II-XII grossly intact. Coordination normal.  Psychiatric: Mood and affect normal. Behavior is normal. Judgment and thought content normal.   BMET    Component Value Date/Time   NA 135 06/06/2021 2244   K 3.8 06/06/2021 2244   CL 104 06/06/2021 2244   CO2 24 06/06/2021 2244   GLUCOSE 101 (H) 06/06/2021 2244   BUN 12 06/06/2021 2244   CREATININE 0.77 06/06/2021 2244   CREATININE 0.90 05/30/2021 1532   CALCIUM 9.1 06/06/2021 2244   GFRNONAA >60 06/06/2021 2244   GFRAA >60 01/24/2018 1056    Lipid Panel     Component Value Date/Time   CHOL 189 03/20/2021 0934   TRIG 70 03/20/2021 0934   HDL 83 03/20/2021 0934   CHOLHDL 2.3 03/20/2021 0934   LDLCALC 90 03/20/2021 0934    CBC    Component Value Date/Time   WBC 6.5 06/06/2021 2244   RBC 4.10 06/06/2021 2244   HGB 12.7 06/06/2021 2244   HCT 37.8 06/06/2021 2244   PLT 286 06/06/2021 2244   MCV 92.2 06/06/2021 2244   MCH 31.0 06/06/2021 2244   MCHC 33.6 06/06/2021 2244   RDW 12.2 06/06/2021 2244   LYMPHSABS 2.2 01/24/2018 1056   MONOABS 0.9 01/24/2018 1056   EOSABS 0.1 01/24/2018 1056   BASOSABS 0.1  01/24/2018 1056    Hgb A1C No results found for: HGBA1C         Assessment & Plan:     Webb Silversmith, NP This visit occurred during the SARS-CoV-2 public health emergency.  Safety protocols were in place, including screening questions prior to the visit, additional usage of staff PPE, and extensive cleaning of exam room while observing appropriate contact time as indicated for disinfecting solutions.

## 2021-06-19 ENCOUNTER — Other Ambulatory Visit: Payer: Self-pay | Admitting: Internal Medicine

## 2021-06-19 MED ORDER — LISINOPRIL 5 MG PO TABS: 7.5000 mg | ORAL_TABLET | Freq: Every day | ORAL | 0 refills | Status: DC

## 2021-06-19 NOTE — Telephone Encounter (Signed)
Requested Prescriptions  Pending Prescriptions Disp Refills   lisinopril (ZESTRIL) 5 MG tablet [Pharmacy Med Name: LISINOPRIL 5MG  TABLETS] 135 tablet 0    Sig: TAKE 1 AND 1/2 TABLETS(7.5 MG) BY MOUTH DAILY     Cardiovascular:  ACE Inhibitors Passed - 06/19/2021 11:40 AM      Passed - Cr in normal range and within 180 days    Creat  Date Value Ref Range Status  05/30/2021 0.90 0.60 - 1.00 mg/dL Final   Creatinine, Ser  Date Value Ref Range Status  06/06/2021 0.77 0.44 - 1.00 mg/dL Final         Passed - K in normal range and within 180 days    Potassium  Date Value Ref Range Status  06/06/2021 3.8 3.5 - 5.1 mmol/L Final         Passed - Patient is not pregnant      Passed - Last BP in normal range    BP Readings from Last 1 Encounters:  06/11/21 132/84         Passed - Valid encounter within last 6 months    Recent Outpatient Visits          1 week ago Primary hypertension   [facility] [place], [place], NP   2 weeks ago Palpitations   [facility] [place], Mullins W, NP   2 months ago Pain and swelling of toe of left foot   [facility] [place], Mullins, NP   3 months ago Medicare annual wellness visit, subsequent   [facility] [place], Mullins W, NP   3 months ago Rash and nonspecific skin eruption   [facility] [place], Mullins, NP      Future Appointments            In 2 days Salvadore Oxford, Sampson Si, NP [facility]   In 3 weeks [facility], Herbie [place], MD [facility]

## 2021-06-21 ENCOUNTER — Encounter: Payer: Self-pay | Admitting: Internal Medicine

## 2021-06-21 ENCOUNTER — Other Ambulatory Visit: Payer: Self-pay | Admitting: Internal Medicine

## 2021-06-21 ENCOUNTER — Ambulatory Visit (INDEPENDENT_AMBULATORY_CARE_PROVIDER_SITE_OTHER): Payer: Medicare Other | Admitting: Internal Medicine

## 2021-06-21 ENCOUNTER — Other Ambulatory Visit: Payer: Self-pay

## 2021-06-21 DIAGNOSIS — E663 Overweight: Secondary | ICD-10-CM

## 2021-06-21 DIAGNOSIS — I10 Essential (primary) hypertension: Secondary | ICD-10-CM | POA: Diagnosis not present

## 2021-06-21 DIAGNOSIS — Z6829 Body mass index (BMI) 29.0-29.9, adult: Secondary | ICD-10-CM

## 2021-06-21 MED ORDER — AMLODIPINE BESYLATE 2.5 MG PO TABS: 2.5000 mg | ORAL_TABLET | Freq: Every day | ORAL | 0 refills | Status: DC

## 2021-06-21 NOTE — Telephone Encounter (Signed)
Requested Prescriptions  Pending Prescriptions Disp Refills   amLODipine (NORVASC) 2.5 MG tablet [Pharmacy Med Name: AMLODIPINE BESYLATE 2.5MG  TABLETS] 90 tablet 0    Sig: TAKE 1 TABLET(2.5 MG) BY MOUTH DAILY     Cardiovascular:  Calcium Channel Blockers Passed - 06/21/2021  2:46 PM      Passed - Last BP in normal range    BP Readings from Last 1 Encounters:  06/21/21 (!) 108/55         Passed - Valid encounter within last 6 months    Recent Outpatient Visits          Today Primary hypertension   [facility] [place], Salvadore Oxford, NP   1 week ago Primary hypertension   [facility] [place], Salvadore Oxford, NP   3 weeks ago Palpitations   [facility] [place], [place], NP   2 months ago Pain and swelling of toe of left foot   [facility] [place], Salvadore Oxford, NP   3 months ago Medicare annual wellness visit, subsequent   [facility] [place], Salvadore Oxford, NP      Future Appointments            In 2 weeks Marykay Lex, MD [facility], [facility]

## 2021-06-21 NOTE — Assessment & Plan Note (Signed)
Encourage diet and exercise for weight loss 

## 2021-06-21 NOTE — Patient Instructions (Signed)

## 2021-06-21 NOTE — Assessment & Plan Note (Signed)
BP labile on Lisinopril, will d/c Lisinopril RX for Amlodipine 2.5 mg daily Reinforced DASH diet and exercise for weight loss  Update me in 1 week with BP readings

## 2021-06-21 NOTE — Progress Notes (Signed)
Subjective:    Patient ID: Anita Mathews, female    DOB: [birth year], 71 y.o.   MRN: [id number]  HPI  Pt presents to the clinic today for follow up of HTN. At her last visit, her Lisinopril was increased to 10 mg daily. She started experiencing dizziness, so she started taking 7.5 mg daily. She reports she continues have lower readings at home. She brought her home BP machine with her to compare the reading with hers.Her BP today is 108/75. ECG from 06/2021 reviewed.  Review of Systems     Past Medical History:  Diagnosis Date   Arthritis    Glaucoma     Current Outpatient Medications  Medication Sig Dispense Refill   alendronate (FOSAMAX) 70 MG tablet Take 1 tablet (70 mg total) by mouth once a week. Take with a full glass of water on an empty stomach. 12 tablet 3   Biotin 1 MG CAPS Take 1 capsule by mouth daily.     calcium citrate-vitamin D (CITRACAL+D) 315-200 MG-UNIT tablet Take 1 tablet by mouth every morning.     cyanocobalamin 100 MCG tablet      dorzolamide-timolol (COSOPT) 22.3-6.8 MG/ML ophthalmic solution Apply to eye.     ferrous gluconate (FERGON) 324 MG tablet Take 324 mg by mouth every other day.     lisinopril (ZESTRIL) 5 MG tablet TAKE 1 AND 1/2 TABLETS(7.5 MG) BY MOUTH DAILY 135 tablet 0   Omega-3 Fatty Acids (FISH OIL) 1000 MG CAPS Take 1 capsule by mouth daily.     rosuvastatin (CRESTOR) 5 MG tablet Take 1 tablet (5 mg total) by mouth daily. 90 tablet 1   Secukinumab (COSENTYX) 150 MG/ML SOSY Inject 150 mg into the skin every 30 (thirty) days.     No current facility-administered medications for this visit.    No Known Allergies  Family History  Problem Relation Age of Onset   Breast cancer Mother 79   Breast cancer Paternal Grandmother     Social History   Socioeconomic History   Marital status: Divorced    Spouse name: Not on file   Number of children: Not on file   Years of education: Not on file   Highest education level: Not on file   Occupational History   Not on file  Tobacco Use   Smoking status: Never   Smokeless tobacco: Never  Vaping Use   Vaping Use: Never used  Substance and Sexual Activity   Alcohol use: Yes    Comment: occasional   Drug use: Never   Sexual activity: Not on file  Other Topics Concern   Not on file  Social History Narrative   Not on file   Social Determinants of Health   Financial Resource Strain: Not on file  Food Insecurity: Not on file  Transportation Needs: Not on file  Physical Activity: Not on file  Stress: Not on file  Social Connections: Not on file  Intimate Partner Violence: Not on file     Constitutional: Denies fever, malaise, fatigue, headache or abrupt weight changes.  HEENT: Denies eye pain, eye redness, ear pain, ringing in the ears, wax buildup, runny nose, nasal congestion, bloody nose, or sore throat. Respiratory: Denies difficulty breathing, shortness of breath, cough or sputum production.   Cardiovascular: Denies chest pain, chest tightness, palpitations or swelling in the hands or feet.  Neurological: Denies dizziness, difficulty with memory, difficulty with speech or problems with balance and coordination.    No other specific complaints in  a complete review of systems (except as listed in HPI above).  Objective:   Physical Exam BP (!) 108/55    Pulse 60    Ht 5\' 4"  (1.626 m)    Wt 170 lb 9.6 oz (77.4 kg)    SpO2 98%    BMI 29.28 kg/m   Wt Readings from Last 3 Encounters:  06/11/21 171 lb (77.6 kg)  06/07/21 170 lb (77.1 kg)  06/06/21 170 lb (77.1 kg)    General: Appears her stated age, well developed, well nourished in NAD. Cardiovascular: Normal rate. Pulmonary/Chest: Normal effort and positive vesicular breath sounds.  Musculoskeletal: No difficulty with gait.  Neurological: Alert and oriented.  BMET    Component Value Date/Time   NA 135 06/06/2021 2244   K 3.8 06/06/2021 2244   CL 104 06/06/2021 2244   CO2 24 06/06/2021 2244    GLUCOSE 101 (H) 06/06/2021 2244   BUN 12 06/06/2021 2244   CREATININE 0.77 06/06/2021 2244   CREATININE 0.90 05/30/2021 1532   CALCIUM 9.1 06/06/2021 2244   GFRNONAA >60 06/06/2021 2244   GFRAA >60 01/24/2018 1056    Lipid Panel     Component Value Date/Time   CHOL 189 03/20/2021 0934   TRIG 70 03/20/2021 0934   HDL 83 03/20/2021 0934   CHOLHDL 2.3 03/20/2021 0934   LDLCALC 90 03/20/2021 0934    CBC    Component Value Date/Time   WBC 6.5 06/06/2021 2244   RBC 4.10 06/06/2021 2244   HGB 12.7 06/06/2021 2244   HCT 37.8 06/06/2021 2244   PLT 286 06/06/2021 2244   MCV 92.2 06/06/2021 2244   MCH 31.0 06/06/2021 2244   MCHC 33.6 06/06/2021 2244   RDW 12.2 06/06/2021 2244   LYMPHSABS 2.2 01/24/2018 1056   MONOABS 0.9 01/24/2018 1056   EOSABS 0.1 01/24/2018 1056   BASOSABS 0.1 01/24/2018 1056    Hgb A1C No results found for: HGBA1C         Assessment & Plan:    Webb Silversmith, NP This visit occurred during the SARS-CoV-2 public health emergency.  Safety protocols were in place, including screening questions prior to the visit, additional usage of staff PPE, and extensive cleaning of exam room while observing appropriate contact time as indicated for disinfecting solutions. '

## 2021-06-26 ENCOUNTER — Encounter: Payer: Self-pay | Admitting: Cardiology

## 2021-06-26 ENCOUNTER — Ambulatory Visit (INDEPENDENT_AMBULATORY_CARE_PROVIDER_SITE_OTHER): Payer: Medicare Other | Admitting: Nurse Practitioner

## 2021-06-27 ENCOUNTER — Ambulatory Visit (INDEPENDENT_AMBULATORY_CARE_PROVIDER_SITE_OTHER): Payer: Medicare Other | Admitting: Nurse Practitioner

## 2021-06-27 ENCOUNTER — Other Ambulatory Visit: Payer: Self-pay

## 2021-06-27 ENCOUNTER — Encounter: Payer: Self-pay | Admitting: Internal Medicine

## 2021-06-27 ENCOUNTER — Encounter (INDEPENDENT_AMBULATORY_CARE_PROVIDER_SITE_OTHER): Payer: Self-pay | Admitting: Nurse Practitioner

## 2021-06-27 VITALS — BP 157/80 | HR 59 | Ht 64.0 in | Wt 169.0 lb

## 2021-06-27 DIAGNOSIS — I83813 Varicose veins of bilateral lower extremities with pain: Secondary | ICD-10-CM

## 2021-06-27 DIAGNOSIS — H9190 Unspecified hearing loss, unspecified ear: Secondary | ICD-10-CM | POA: Insufficient documentation

## 2021-06-27 DIAGNOSIS — M810 Age-related osteoporosis without current pathological fracture: Secondary | ICD-10-CM | POA: Insufficient documentation

## 2021-07-07 ENCOUNTER — Encounter (INDEPENDENT_AMBULATORY_CARE_PROVIDER_SITE_OTHER): Payer: Self-pay | Admitting: Nurse Practitioner

## 2021-07-07 NOTE — Progress Notes (Signed)
Subjective:    Patient ID: Anita Mathews, female    DOB: 1951-06-02, 71 y.o.   MRN: [id number] Chief Complaint  Patient presents with   Follow-up    3 mo F/U no studies    The patient returns to the office for followup evaluation regarding leg swelling.  The swelling has improved somewhat a bit and the pain associated with swelling has decreased substantially. There have not been any interval development of a ulcerations or wounds.  Since the previous visit the patient has been wearing graduated compression stockings and has noted little significant improvement in the lymphedema. The patient has been using compression routinely morning until night.  The patient also states elevation during the day and exercise is being done too.        Review of Systems  Skin:  Negative for wound.  All other systems reviewed and are negative.     Objective:   Physical Exam Vitals reviewed.  HENT:     Head: Normocephalic.  Cardiovascular:     Rate and Rhythm: Normal rate.     Pulses: Normal pulses.  Pulmonary:     Effort: Pulmonary effort is normal.  Skin:    General: Skin is warm and dry.  Neurological:     Mental Status: She is alert and oriented to person, place, and time.  Psychiatric:        Mood and Affect: Mood normal.        Behavior: Behavior normal.        Thought Content: Thought content normal.        Judgment: Judgment normal.    BP (!) 157/80    Pulse (!) 59    Ht 5\' 4"  (1.626 m)    Wt 169 lb (76.7 kg)    BMI 29.01 kg/m   Past Medical History:  Diagnosis Date   Arthritis    Glaucoma     Social History   Socioeconomic History   Marital status: Divorced    Spouse name: Not on file   Number of children: Not on file   Years of education: Not on file   Highest education level: Not on file  Occupational History   Not on file  Tobacco Use   Smoking status: Never   Smokeless tobacco: Never  Vaping Use   Vaping Use: Never used  Substance and Sexual  Activity   Alcohol use: Yes    Comment: occasional   Drug use: Never   Sexual activity: Not on file  Other Topics Concern   Not on file  Social History Narrative   Not on file   Social Determinants of Health   Financial Resource Strain: Not on file  Food Insecurity: Not on file  Transportation Needs: Not on file  Physical Activity: Not on file  Stress: Not on file  Social Connections: Not on file  Intimate Partner Violence: Not on file    Past Surgical History:  Procedure Laterality Date   CESAREAN SECTION     EYE SURGERY     SHOULDER SURGERY Left     Family History  Problem Relation Age of Onset   Breast cancer Mother 35   Breast cancer Paternal Grandmother     No Known Allergies  CBC Latest Ref Rng & Units 06/06/2021 03/20/2021 01/24/2018  WBC 4.0 - 10.5 K/uL 6.5 5.5 8.7  Hemoglobin 12.0 - 15.0 g/dL 12.7 13.3 12.1  Hematocrit 36.0 - 46.0 % 37.8 40.0 35.3  Platelets 150 - 400 K/uL  286 304 270      CMP     Component Value Date/Time   NA 135 06/06/2021 2244   K 3.8 06/06/2021 2244   CL 104 06/06/2021 2244   CO2 24 06/06/2021 2244   GLUCOSE 101 (H) 06/06/2021 2244   BUN 12 06/06/2021 2244   CREATININE 0.77 06/06/2021 2244   CREATININE 0.90 05/30/2021 1532   CALCIUM 9.1 06/06/2021 2244   PROT 7.4 03/20/2021 0934   ALBUMIN 3.9 01/24/2018 1056   AST 19 03/20/2021 0934   ALT 15 03/20/2021 0934   ALKPHOS 56 01/24/2018 1056   BILITOT 0.7 03/20/2021 0934   GFRNONAA >60 06/06/2021 2244   GFRAA >60 01/24/2018 1056     No results found.     Assessment & Plan:   1. Varicose veins of bilateral lower extremities with pain Today we had a discussion in regards to the patient's pain.  She does have a small area reflux in her right lower extremity and based on description of pain is not entirely consistent with pain from varicosities.  We discussed endovenous laser ablation versus continued conservative therapy.  Follow Wieting this discussion the patient will  continue with conservative therapy at this time.  We will have her follow-up in 1 year however the patient begins to have worsening of issues she is welcome to contact office for reevaluation.   Current Outpatient Medications on File Prior to Visit  Medication Sig Dispense Refill   alendronate (FOSAMAX) 70 MG tablet Take 1 tablet (70 mg total) by mouth once a week. Take with a full glass of water on an empty stomach. 12 tablet 3   amLODipine (NORVASC) 2.5 MG tablet TAKE 1 TABLET(2.5 MG) BY MOUTH DAILY 90 tablet 0   Biotin 1 MG CAPS Take 1 capsule by mouth daily.     calcium citrate-vitamin D (CITRACAL+D) 315-200 MG-UNIT tablet Take 1 tablet by mouth every morning.     cyanocobalamin 100 MCG tablet      dorzolamide-timolol (COSOPT) 22.3-6.8 MG/ML ophthalmic solution Apply to eye.     ferrous gluconate (FERGON) 324 MG tablet Take 324 mg by mouth every other day.     Omega-3 Fatty Acids (FISH OIL) 1000 MG CAPS Take 1 capsule by mouth daily.     rosuvastatin (CRESTOR) 5 MG tablet Take 1 tablet (5 mg total) by mouth daily. 90 tablet 1   Secukinumab (COSENTYX) 150 MG/ML SOSY Inject 150 mg into the skin every 30 (thirty) days.     No current facility-administered medications on file prior to visit.    There are no Patient Instructions on file for this visit. No follow-ups on file.   Kris Hartmann, NP

## 2021-07-11 ENCOUNTER — Ambulatory Visit: Payer: Medicare Other | Admitting: Cardiology

## 2021-07-12 ENCOUNTER — Ambulatory Visit: Payer: Medicare Other | Admitting: Cardiovascular Disease

## 2021-07-22 ENCOUNTER — Ambulatory Visit: Payer: Medicare Other

## 2021-07-29 ENCOUNTER — Encounter: Payer: Self-pay | Admitting: Cardiovascular Disease

## 2021-07-29 ENCOUNTER — Ambulatory Visit: Payer: Medicare Other | Admitting: Cardiovascular Disease

## 2021-07-29 ENCOUNTER — Other Ambulatory Visit: Payer: Self-pay

## 2021-07-29 VITALS — BP 130/60 | Ht 64.0 in | Wt 169.1 lb

## 2021-07-29 DIAGNOSIS — R002 Palpitations: Secondary | ICD-10-CM

## 2021-07-29 DIAGNOSIS — G44209 Tension-type headache, unspecified, not intractable: Secondary | ICD-10-CM | POA: Diagnosis not present

## 2021-07-29 DIAGNOSIS — R0989 Other specified symptoms and signs involving the circulatory and respiratory systems: Secondary | ICD-10-CM

## 2021-07-29 MED ORDER — PROPRANOLOL HCL 10 MG PO TABS
ORAL_TABLET | ORAL | 0 refills | Status: DC
Start: 2021-07-29 — End: 2021-09-04

## 2021-07-29 MED ORDER — HYDRALAZINE HCL 25 MG PO TABS: ORAL_TABLET | ORAL | 0 refills | Status: DC

## 2021-07-29 NOTE — Patient Instructions (Addendum)
Medication Instructions:  - Your physician has recommended you make the following change in your medication:   1) START Hydralazine 25 mg up to three times a day as needed for pressure >150  2) START Propranolol 10 mg up to three times a day as needed for tachycardia  If you need a refill on your cardiac medications before your next appointment, please call your pharmacy.   Lab work: No new labs needed  Testing/Procedures: No new testing needed  Follow-Up: At [facility], you and your health needs are our priority.  As part of our continuing mission to provide you with exceptional heart care, we have created designated Provider Care Teams.  These Care Teams include your primary Cardiologist (physician) and Advanced Practice Providers (APPs -  Physician Assistants and Nurse Practitioners) who all work together to provide you with the care you need, when you need it.  You will need a follow up appointment as needed  Providers on your designated Care Team:   Murray Hodgkins, NP Christell Faith, PA-C Cadence Kathlen Mody, [place]  COVID-19 Vaccine Information can be found at: [email or web address].uk For questions related to vaccine distribution or appointments, please email [email or web address] or call [phone].

## 2021-07-29 NOTE — Progress Notes (Signed)
Cardiology Office Note  Date:  07/29/2021   ID:  Anita, Mathews 05-18-1951, MRN [id number]  PCP:  Lorre Munroe, NP   Chief Complaint  Patient presents with   New Patient (Initial Visit)    Ref by Nicki Reaper, NP for palpitations. Patient c/o palpitations, chest pain at times and shortness of breath with climbing stairs. Medications reviewed by the patient verbally.     HPI:  [name] is a 71 year old woman with past medical history of Labile hypertension,periodic spikes Hyperlipidemia Paroxysmal tachycardia anxiety Referred by Nicki Reaper for consultation of her hypertension, palpitations  Prior side effects on lisinopril, blood pressure running too low  Stress end of dec 2022 into 2022-06-26, Daughter in law died cancer  Seen by primary care May 30, 2021, reported palpitations for 2 months Periodic tachycardia over the years  has to use Valsalva maneuver to slow heart rate down  In 06-26-22, Neck pain, seen in the emergency room, blood pressure was elevated 200 systolic After pain resolved blood pressure down to 135/57, her baseline Virtual visit with primary care June 07, 2021,  Blood pressure 200/80, she called 911, evaluated in the emergency room Testing normal On virtual call blood pressure 198/90 It was recommended that she go back to the hospital  CT scan head neck for headache No acute abnormalities No significant plaque noted  Reports neck and headaches seem to have resolved, etiology unclear  Fm hx Mom  7 yo with anxiety, HTN  EKG personally reviewed by myself on todays visit Sinus bradycardia rate 52 bpm nonspecific T wave abnormality  PMH:   has a past medical history of Arthritis and Glaucoma.  PSH:    Past Surgical History:  Procedure Laterality Date   CESAREAN SECTION     EYE SURGERY     SHOULDER SURGERY Left     Current Outpatient Medications  Medication Sig Dispense Refill   alendronate (FOSAMAX) 70 MG tablet Take 1  tablet (70 mg total) by mouth once a week. Take with a full glass of water on an empty stomach. 12 tablet 3   amLODipine (NORVASC) 2.5 MG tablet TAKE 1 TABLET(2.5 MG) BY MOUTH DAILY 90 tablet 0   Biotin 1 MG CAPS Take 1 capsule by mouth daily.     calcium citrate-vitamin D (CITRACAL+D) 315-200 MG-UNIT tablet Take 1 tablet by mouth every morning.     cyanocobalamin 100 MCG tablet      dorzolamide-timolol (COSOPT) 22.3-6.8 MG/ML ophthalmic solution Apply to eye.     ferrous gluconate (FERGON) 324 MG tablet Take 324 mg by mouth every other day.     Omega-3 Fatty Acids (FISH OIL) 1000 MG CAPS Take 1 capsule by mouth daily.     OPTIVE 0.5-0.9 % ophthalmic solution SMARTSIG:1 Drop(s) In Eye(s) PRN     rosuvastatin (CRESTOR) 5 MG tablet Take 1 tablet (5 mg total) by mouth daily. 90 tablet 1   Secukinumab (COSENTYX) 150 MG/ML SOSY Inject 150 mg into the skin every 30 (thirty) days.     No current facility-administered medications for this visit.     Allergies:   Patient has no known allergies.   Social History:  The patient  reports that she has never smoked. She has never used smokeless tobacco. She reports current alcohol use. She reports that she does not use drugs.   Family History:   family history includes Breast cancer in her paternal grandmother; Breast cancer (age of onset: 41) in her mother.  Review of Systems: ROS   PHYSICAL EXAM: VS:  BP 130/60 (BP Location: Right Arm, Patient Position: Sitting, Cuff Size: Normal)    Ht 5\' 4"  (1.626 m)    Wt 169 lb 2 oz (76.7 kg)    SpO2 99%    BMI 29.03 kg/m  , BMI Body mass index is 29.03 kg/m. GEN: Well nourished, well developed, in no acute distress HEENT: normal Neck: no JVD, carotid bruits, or masses Cardiac: RRR; no murmurs, rubs, or gallops,no edema  Respiratory:  clear to auscultation bilaterally, normal work of breathing GI: soft, nontender, nondistended, + BS MS: no deformity or atrophy Skin: warm and dry, no rash Neuro:   Strength and sensation are intact Psych: euthymic mood, full affect   Recent Labs: 03/20/2021: ALT 15 05/30/2021: Magnesium 2.2; TSH 2.35 06/06/2021: BUN 12; Creatinine, Ser 0.77; Hemoglobin 12.7; Platelets 286; Potassium 3.8; Sodium 135    Lipid Panel Lab Results  Component Value Date   CHOL 189 03/20/2021   HDL 83 03/20/2021   LDLCALC 90 03/20/2021   TRIG 70 03/20/2021      Wt Readings from Last 3 Encounters:  07/29/21 169 lb 2 oz (76.7 kg)  06/27/21 169 lb (76.7 kg)  06/21/21 170 lb 9.6 oz (77.4 kg)      ASSESSMENT AND PLAN:  Problem List Items Addressed This Visit   None Visit Diagnoses     Palpitations    -  Primary   Labile blood pressure       Tension-type headache, not intractable, unspecified chronicity pattern          Labile blood pressures Unable to exclude exacerbation by pain or anxiety Significant stressors end of December into January Currently feels well, blood pressure relatively well controlled Recommend for spike in blood pressure that she take hydralazine 25 mg as needed .  Extra dose could be taken as needed  Paroxysmal tachycardia Unable to exclude arrhythmia versus anxiety/stress situation Rare episodes If episodes become more frequent, would order Zio monitor For now we will order propranolol 20 mg to take as needed   Total encounter time more than 60 minutes  Greater than 50% was spent in counseling and coordination of care with the patient    Signed, Esmond Plants, M.D., Ph.D. [place], [place]

## 2021-08-08 ENCOUNTER — Encounter: Payer: Self-pay | Admitting: Internal Medicine

## 2021-08-08 ENCOUNTER — Ambulatory Visit (INDEPENDENT_AMBULATORY_CARE_PROVIDER_SITE_OTHER): Payer: Medicare Other | Admitting: Internal Medicine

## 2021-08-08 ENCOUNTER — Ambulatory Visit
Admission: RE | Admit: 2021-08-08 | Discharge: 2021-08-08 | Disposition: A | Discharge status: Home / Self Care | Payer: Medicare Other | Attending: Internal Medicine | Admitting: Internal Medicine

## 2021-08-08 ENCOUNTER — Ambulatory Visit
Admission: RE | Admit: 2021-08-08 | Discharge: 2021-08-08 | Disposition: A | Discharge status: Home / Self Care | Payer: Medicare Other | Source: Ambulatory Visit | Attending: Internal Medicine | Admitting: Internal Medicine

## 2021-08-08 ENCOUNTER — Other Ambulatory Visit: Payer: Self-pay

## 2021-08-08 VITALS — BP 136/56 | HR 57 | Temp 96.9°F | Wt 169.0 lb

## 2021-08-08 DIAGNOSIS — Z6829 Body mass index (BMI) 29.0-29.9, adult: Secondary | ICD-10-CM | POA: Diagnosis not present

## 2021-08-08 DIAGNOSIS — M25551 Pain in right hip: Secondary | ICD-10-CM

## 2021-08-08 DIAGNOSIS — E663 Overweight: Secondary | ICD-10-CM | POA: Diagnosis not present

## 2021-08-08 NOTE — Assessment & Plan Note (Signed)
Encourage diet and exercise for weight loss as this can help reduce joint pain 

## 2021-08-08 NOTE — Patient Instructions (Signed)
Hip Exercises °Ask your health care provider which exercises are safe for you. Do exercises exactly as told by your health care provider and adjust them as directed. It is normal to feel mild stretching, pulling, tightness, or discomfort as you do these exercises. Stop right away if you feel sudden pain or your pain gets worse. Do not begin these exercises until told by your health care provider. °Stretching and range-of-motion exercises °These exercises warm up your muscles and joints and improve the movement and flexibility of your hip. These exercises also help to relieve pain, numbness, and tingling. You may be asked to limit your range of motion if you had a hip replacement. Talk to your health care provider about these restrictions. °Hamstrings, supine ° °Lie on your back (supine position). °Loop a belt or towel over the ball of your left / right foot. The ball of your foot is on the walking surface, right under your toes. °Straighten your left / right knee and slowly pull on the belt or towel to raise your leg until you feel a gentle stretch behind your knee (hamstring). °Do not let your knee bend while you do this. °Keep your other leg flat on the floor. °Hold this position for __________ seconds. °Slowly return your leg to the starting position. °Repeat __________ times. Complete this exercise __________ times a day. °Hip rotation ° °Lie on your back on a firm surface. °With your left / right hand, gently pull your left / right knee toward the shoulder that is on the same side of the body. Stop when your knee is pointing toward the ceiling. °Hold your left / right ankle with your other hand. °Keeping your knee steady, gently pull your left / right ankle toward your other shoulder until you feel a stretch in your buttocks. °Keep your hips and shoulders firmly planted while you do this stretch. °Hold this position for __________ seconds. °Repeat __________ times. Complete this exercise __________ times a  day. °Seated stretch °This exercise is sometimes called hamstrings and adductors stretch. °Sit on the floor with your legs stretched wide. Keep your knees straight during this exercise. °Keeping your head and back in a straight line, bend at your waist to reach for your left foot (position A). You should feel a stretch in your right inner thigh (adductors). °Hold this position for __________ seconds. Then slowly return to the upright position. °Keeping your head and back in a straight line, bend at your waist to reach forward (position B). You should feel a stretch behind both of your thighs and knees (hamstrings). °Hold this position for __________ seconds. Then slowly return to the upright position. °Keeping your head and back in a straight line, bend at your waist to reach for your right foot (position C). You should feel a stretch in your left inner thigh (adductors). °Hold this position for __________ seconds. Then slowly return to the upright position. °Repeat __________ times. Complete this exercise __________ times a day. °Lunge °This exercise stretches the muscles of the hip (hip flexors). °Place your left / right knee on the floor and bend your other knee so that is directly over your ankle. You should be half-kneeling. °Keep good posture with your head over your shoulders. °Tighten your buttocks to point your tailbone downward. This will prevent your back from arching too much. °You should feel a gentle stretch in the front of your left / right thigh and hip. If you do not feel a stretch, slide your other foot   forward slightly and then slowly lunge forward with your chest up until your knee once again lines up over your ankle. °Make sure your tailbone continues to point downward. °Hold this position for __________ seconds. °Slowly return to the starting position. °Repeat __________ times. Complete this exercise __________ times a day. °Strengthening exercises °These exercises build strength and endurance  in your hip. Endurance is the ability to use your muscles for a long time, even after they get tired. °Bridge °This exercise strengthens the muscles of your hip (hip extensors). °Lie on your back on a firm surface with your knees bent and your feet flat on the floor. °Tighten your buttocks muscles and lift your bottom off the floor until the trunk of your body and your hips are level with your thighs. °Do not arch your back. °You should feel the muscles working in your buttocks and the back of your thighs. If you do not feel these muscles, slide your feet 1-2 inches (2.5-5 cm) farther away from your buttocks. °Hold this position for __________ seconds. °Slowly lower your hips to the starting position. °Let your muscles relax completely between repetitions. °Repeat __________ times. Complete this exercise __________ times a day. °Straight leg raises, side-lying °This exercise strengthens the muscles that move the hip joint away from the center of the body (hip abductors). °Lie on your side with your left / right leg in the top position. Lie so your head, shoulder, hip, and knee line up. You may bend your bottom knee slightly to help you balance. °Roll your hips slightly forward, so your hips are stacked directly over each other and your left / right knee is facing forward. °Leading with your heel, lift your top leg 4-6 inches (10-15 cm). You should feel the muscles in your top hip lifting. °Do not let your foot drift forward. °Do not let your knee roll toward the ceiling. °Hold this position for __________ seconds. °Slowly return to the starting position. °Let your muscles relax completely between repetitions. °Repeat __________ times. Complete this exercise __________ times a day. °Straight leg raises, side-lying °This exercise strengthens the muscles that move the hip joint toward the center of the body (hip adductors). °Lie on your side with your left / right leg in the bottom position. Lie so your head, shoulder,  hip, and knee line up. You may place your upper foot in front to help you balance. °Roll your hips slightly forward, so your hips are stacked directly over each other and your left / right knee is facing forward. °Tense the muscles in your inner thigh and lift your bottom leg 4-6 inches (10-15 cm). °Hold this position for __________ seconds. °Slowly return to the starting position. °Let your muscles relax completely between repetitions. °Repeat __________ times. Complete this exercise __________ times a day. °Straight leg raises, supine °This exercise strengthens the muscles in the front of your thigh (quadriceps). °Lie on your back (supine position) with your left / right leg extended and your other knee bent. °Tense the muscles in the front of your left / right thigh. You should see your kneecap slide up or see increased dimpling just above your knee. °Keep these muscles tight as you raise your leg 4-6 inches (10-15 cm) off the floor. Do not let your knee bend. °Hold this position for __________ seconds. °Keep these muscles tense as you lower your leg. °Relax the muscles slowly and completely between repetitions. °Repeat __________ times. Complete this exercise __________ times a day. °Hip abductors, standing °This   exercise strengthens the muscles that move the leg and hip joint away from the center of the body (hip abductors). °Tie one end of a rubber exercise band or tubing to a secure surface, such as a chair, table, or pole. °Loop the other end of the band or tubing around your left / right ankle. °Keeping your ankle with the band or tubing directly opposite the secured end, step away until there is tension in the tubing or band. Hold on to a chair, table, or pole as needed for balance. °Lift your left / right leg out to your side. While you do this: °Keep your back upright. °Keep your shoulders over your hips. °Keep your toes pointing forward. °Make sure to use your hip muscles to slowly lift your leg. Do not  tip your body or forcefully lift your leg. °Hold this position for __________ seconds. °Slowly return to the starting position. °Repeat __________ times. Complete this exercise __________ times a day. °Squats °This exercise strengthens the muscles in the front of your thigh (quadriceps). °Stand in a door frame so your feet and knees are in line with the frame. You may place your hands on the frame for balance. °Slowly bend your knees and lower your hips like you are going to sit in a chair. °Keep your lower legs in a straight-up-and-down position. °Do not let your hips go lower than your knees. °Do not bend your knees lower than told by your health care provider. °If your hip pain increases, do not bend as low. °Hold this position for ___________ seconds. °Slowly push with your legs to return to standing. Do not use your hands to pull yourself to standing. °Repeat __________ times. Complete this exercise __________ times a day. °This information is not intended to replace advice given to you by your health care provider. Make sure you discuss any questions you have with your health care provider. °Document Revised: 10/03/2020 Document Reviewed: 10/03/2020 °Elsevier Patient Education © 2022 [employer] ° °

## 2021-08-08 NOTE — Progress Notes (Signed)
? ?Subjective:  ? ? Patient ID: Anita Mathews, female    DOB: 1950/11/11, 71 y.o.   MRN: [id number] ? ?HPI ? ?Patient presents to clinic today with complaint of right leg pain.  She reports this has been an ongoing issues but this time the pain woke her up in the middle of the night 2 nights ago.  She describes the pain as dull and achy. The pain radiates into her calf. She was laying on her right side when the pain woke her up. She denies numbness, tingling, weakness, swelling or redness or the right lower extremity. She denies low back pain. Pt states the pain has improved but it has happened before and wanted to get it evaluated.  She has not taken anything OTC for this. ? ?Review of Systems ? ?   ?Past Medical History:  ?Diagnosis Date  ? Arthritis   ? Glaucoma   ? ? ?Current Outpatient Medications  ?Medication Sig Dispense Refill  ? alendronate (FOSAMAX) 70 MG tablet Take 1 tablet (70 mg total) by mouth once a week. Take with a full glass of water on an empty stomach. 12 tablet 3  ? amLODipine (NORVASC) 2.5 MG tablet TAKE 1 TABLET(2.5 MG) BY MOUTH DAILY 90 tablet 0  ? Biotin 1 MG CAPS Take 1 capsule by mouth daily.    ? calcium citrate-vitamin D (CITRACAL+D) 315-200 MG-UNIT tablet Take 1 tablet by mouth every morning.    ? cyanocobalamin 100 MCG tablet     ? dorzolamide-timolol (COSOPT) 22.3-6.8 MG/ML ophthalmic solution Apply to eye.    ? ferrous gluconate (FERGON) 324 MG tablet Take 324 mg by mouth every other day.    ? hydrALAZINE (APRESOLINE) 25 MG tablet Take 1 tablet (25 mg) by mouth up to three times a day as needed for systolic blood pressure > 150 90 tablet 0  ? Omega-3 Fatty Acids (FISH OIL) 1000 MG CAPS Take 1 capsule by mouth daily.    ? OPTIVE 0.5-0.9 % ophthalmic solution SMARTSIG:1 Drop(s) In Eye(s) PRN    ? propranolol (INDERAL) 10 MG tablet Take 1 tablet (10 mg) by mouth up to three times a day as needed for tachycardia 90 tablet 0  ? rosuvastatin (CRESTOR) 5 MG tablet Take 1 tablet (5 mg  total) by mouth daily. 90 tablet 1  ? Secukinumab (COSENTYX) 150 MG/ML SOSY Inject 150 mg into the skin every 30 (thirty) days.    ? ?No current facility-administered medications for this visit.  ? ? ?No Known Allergies ? ?Family History  ?Problem Relation Age of Onset  ? Breast cancer Mother 38  ? Breast cancer Paternal Grandmother   ? ? ?Social History  ? ?Socioeconomic History  ? Marital status: Divorced  ?  Spouse name: Not on file  ? Number of children: Not on file  ? Years of education: Not on file  ? Highest education level: Not on file  ?Occupational History  ? Not on file  ?Tobacco Use  ? Smoking status: Never  ? Smokeless tobacco: Never  ?Vaping Use  ? Vaping Use: Never used  ?Substance and Sexual Activity  ? Alcohol use: Yes  ?  Comment: occasional  ? Drug use: Never  ? Sexual activity: Not on file  ?Other Topics Concern  ? Not on file  ?Social History Narrative  ? Not on file  ? ?Social Determinants of Health  ? ?Financial Resource Strain: Not on file  ?Food Insecurity: Not on file  ?Transportation Needs: Not on  file  ?Physical Activity: Not on file  ?Stress: Not on file  ?Social Connections: Not on file  ?Intimate Partner Violence: Not on file  ? ? ? ?Constitutional: Denies fever, malaise, fatigue, headache or abrupt weight changes.  ?Respiratory: Denies difficulty breathing, shortness of breath, cough or sputum production.   ?Cardiovascular: Denies chest pain, chest tightness, palpitations or swelling in the hands or feet.  ?Musculoskeletal: Patient reports right leg pain.  Denies decrease in range of motion, difficulty with gait, muscle pain or joint swelling.  ?Skin: Denies redness, rashes, lesions or ulcercations.  ?Neurological: Denies numbness, tingling, weakness or problems with balance and coordination.  ? ? ?No other specific complaints in a complete review of systems (except as listed in HPI above). ? ?Objective:  ? Physical Exam ?BP (!) 136/56 (BP Location: Left Arm, Patient Position:  Sitting, Cuff Size: Large)   Pulse (!) 57   Temp (!) 96.9 ?F (36.1 ?C) (Temporal)   Wt 169 lb (76.7 kg)   SpO2 100%   BMI 29.01 kg/m?  ? ?Wt Readings from Last 3 Encounters:  ?07/29/21 169 lb 2 oz (76.7 kg)  ?06/27/21 169 lb (76.7 kg)  ?06/21/21 170 lb 9.6 oz (77.4 kg)  ? ? ?General: Appears her stated age, overweight, in NAD. ?Skin: Warm, dry and intact.  ?Cardiovascular: Bradycardic with normal rhythm. No JVD or BLE edema.  ?Pulmonary/Chest: Normal effort and positive vesicular breath sounds. No respiratory distress. No wheezes, rales or ronchi noted.  ?Musculoskeletal: Normal flexion, extension, rotation and lateral bending of the spine. No pain with palpation of the lumbar spine or right SI joint. Normal abduction, adduction, internal and external rotation of the right hip. Pain with palpation over the right trochanter. Strength 5/5 BLE. Able to stand on tip toes and heels. No difficulty with gait.  ?Neurological: Alert and oriented. Negative SLR on the right. ? ? ?BMET ?   ?Component Value Date/Time  ? NA 135 06/06/2021 2244  ? K 3.8 06/06/2021 2244  ? CL 104 06/06/2021 2244  ? CO2 24 06/06/2021 2244  ? GLUCOSE 101 (H) 06/06/2021 2244  ? BUN 12 06/06/2021 2244  ? CREATININE 0.77 06/06/2021 2244  ? CREATININE 0.90 05/30/2021 1532  ? CALCIUM 9.1 06/06/2021 2244  ? GFRNONAA >60 06/06/2021 2244  ? GFRAA >60 01/24/2018 1056  ? ? ?Lipid Panel  ?   ?Component Value Date/Time  ? CHOL 189 03/20/2021 0934  ? TRIG 70 03/20/2021 0934  ? HDL 83 03/20/2021 0934  ? CHOLHDL 2.3 03/20/2021 0934  ? LDLCALC 90 03/20/2021 0934  ? ? ?CBC ?   ?Component Value Date/Time  ? WBC 6.5 06/06/2021 2244  ? RBC 4.10 06/06/2021 2244  ? HGB 12.7 06/06/2021 2244  ? HCT 37.8 06/06/2021 2244  ? PLT 286 06/06/2021 2244  ? MCV 92.2 06/06/2021 2244  ? MCH 31.0 06/06/2021 2244  ? MCHC 33.6 06/06/2021 2244  ? RDW 12.2 06/06/2021 2244  ? LYMPHSABS 2.2 01/24/2018 1056  ? MONOABS 0.9 01/24/2018 1056  ? EOSABS 0.1 01/24/2018 1056  ? BASOSABS 0.1  01/24/2018 1056  ? ? ?Hgb A1C ?No results found for: HGBA1C ? ? ? ? ? ?   ?Assessment & Plan:  ? ?Right Hip Pain: ? ?Xray right hip today ?She reports the pain has improved and she does not feel like she needs any prescription pain medications at this time ?She can try Tylenol OTC as needed for pain ?Hip exercises given ?Encouraged ice for 10 minutes twice daily as needed  pain persist ? ?We will follow-up after imaging with further recommendation and treatment plan ? ? ?Webb Silversmith, NP ?This visit occurred during the SARS-CoV-2 public health emergency.  Safety protocols were in place, including screening questions prior to the visit, additional usage of staff PPE, and extensive cleaning of exam room while observing appropriate contact time as indicated for disinfecting solutions.  ? ?

## 2021-08-12 DIAGNOSIS — M816 Localized osteoporosis [Lequesne]: Secondary | ICD-10-CM | POA: Diagnosis not present

## 2021-08-12 DIAGNOSIS — Z1211 Encounter for screening for malignant neoplasm of colon: Secondary | ICD-10-CM | POA: Diagnosis not present

## 2021-08-15 DIAGNOSIS — H5213 Myopia, bilateral: Secondary | ICD-10-CM | POA: Diagnosis not present

## 2021-08-19 ENCOUNTER — Ambulatory Visit
Admission: RE | Admit: 2021-08-19 | Discharge: 2021-08-19 | Disposition: A | Discharge status: Home / Self Care | Payer: Medicare Other | Source: Ambulatory Visit | Attending: Internal Medicine | Admitting: Internal Medicine

## 2021-08-19 ENCOUNTER — Other Ambulatory Visit: Payer: Self-pay

## 2021-08-19 DIAGNOSIS — Z1231 Encounter for screening mammogram for malignant neoplasm of breast: Secondary | ICD-10-CM | POA: Diagnosis not present

## 2021-08-20 ENCOUNTER — Encounter: Payer: Self-pay | Admitting: Internal Medicine

## 2021-09-04 ENCOUNTER — Other Ambulatory Visit: Payer: Self-pay | Admitting: Cardiovascular Disease

## 2021-09-05 ENCOUNTER — Ambulatory Visit (INDEPENDENT_AMBULATORY_CARE_PROVIDER_SITE_OTHER): Payer: Medicare Other | Admitting: Internal Medicine

## 2021-09-05 ENCOUNTER — Encounter: Payer: Self-pay | Admitting: Internal Medicine

## 2021-09-05 VITALS — BP 116/64 | HR 59 | Temp 97.3°F | Wt 169.0 lb

## 2021-09-05 DIAGNOSIS — S76111D Strain of right quadriceps muscle, fascia and tendon, subsequent encounter: Secondary | ICD-10-CM

## 2021-09-05 DIAGNOSIS — M79651 Pain in right thigh: Secondary | ICD-10-CM

## 2021-09-05 DIAGNOSIS — H35371 Puckering of macula, right eye: Secondary | ICD-10-CM | POA: Diagnosis not present

## 2021-09-05 DIAGNOSIS — H35343 Macular cyst, hole, or pseudohole, bilateral: Secondary | ICD-10-CM | POA: Diagnosis not present

## 2021-09-05 NOTE — Progress Notes (Signed)
? ?Subjective:  ? ? Patient ID: Anita Mathews, female    DOB: 04-11-1951, 71 y.o.   MRN: [id number] ? ?HPI ? ?Pt presents to the clinic today with c/o right hip/thigh pain.  This started months ago, but it seems to be getting worse.  She describes the pain as achy and tender to touch.  She denies low back pain, numbness, tingling, burning or weakness. She was seen 08/08/2021 for the same.  X-ray of the right hip at that time did not show any acute findings. She has tried Tylenol OTC with minimal relief of symptoms.  ? ?Review of Systems ? ?Past Medical History:  ?Diagnosis Date  ? Arthritis   ? Glaucoma   ? ? ?Current Outpatient Medications  ?Medication Sig Dispense Refill  ? alendronate (FOSAMAX) 70 MG tablet Take 1 tablet (70 mg total) by mouth once a week. Take with a full glass of water on an empty stomach. 12 tablet 3  ? amLODipine (NORVASC) 2.5 MG tablet TAKE 1 TABLET(2.5 MG) BY MOUTH DAILY 90 tablet 0  ? Biotin 1 MG CAPS Take 1 capsule by mouth daily.    ? calcium citrate-vitamin D (CITRACAL+D) 315-200 MG-UNIT tablet Take 1 tablet by mouth every morning.    ? cyanocobalamin 100 MCG tablet     ? dorzolamide-timolol (COSOPT) 22.3-6.8 MG/ML ophthalmic solution Apply to eye.    ? ferrous gluconate (FERGON) 324 MG tablet Take 324 mg by mouth every other day.    ? hydrALAZINE (APRESOLINE) 25 MG tablet TAKE 1 TABLET BY MOUTH THREE TIMES DAILY AS NEEDED FOR SYSTOLIC BLOOD PRESSURE 270 tablet 0  ? Omega-3 Fatty Acids (FISH OIL) 1000 MG CAPS Take 1 capsule by mouth daily.    ? OPTIVE 0.5-0.9 % ophthalmic solution SMARTSIG:1 Drop(s) In Eye(s) PRN    ? propranolol (INDERAL) 10 MG tablet TAKE 1 TABLET(10 MG) BY MOUTH UP TO THREE TIMES DAILY AS NEEDED FOR FAST HEART RATE 270 tablet 0  ? rosuvastatin (CRESTOR) 5 MG tablet Take 1 tablet (5 mg total) by mouth daily. 90 tablet 1  ? Secukinumab (COSENTYX) 150 MG/ML SOSY Inject 150 mg into the skin every 30 (thirty) days.    ? ?No current facility-administered medications for  this visit.  ? ? ?No Known Allergies ? ?Family History  ?Problem Relation Age of Onset  ? Breast cancer Mother [id number]  ? Breast cancer Paternal Grandmother   ? ? ?Social History  ? ?Socioeconomic History  ? Marital status: Divorced  ?  Spouse name: Not on file  ? Number of children: Not on file  ? Years of education: Not on file  ? Highest education level: Not on file  ?Occupational History  ? Not on file  ?Tobacco Use  ? Smoking status: Never  ? Smokeless tobacco: Never  ?Vaping Use  ? Vaping Use: Never used  ?Substance and Sexual Activity  ? Alcohol use: Yes  ?  Comment: occasional  ? Drug use: Never  ? Sexual activity: Not on file  ?Other Topics Concern  ? Not on file  ?Social History Narrative  ? Not on file  ? ?Social Determinants of Health  ? ?Financial Resource Strain: Not on file  ?Food Insecurity: Not on file  ?Transportation Needs: Not on file  ?Physical Activity: Not on file  ?Stress: Not on file  ?Social Connections: Not on file  ?Intimate Partner Violence: Not on file  ? ? ? ?Constitutional: Denies fever, malaise, fatigue, headache or abrupt weight changes.  ?Respiratory:  Denies difficulty breathing, shortness of breath, cough or sputum production.   ?Cardiovascular: Denies chest pain, chest tightness, palpitations or swelling in the hands or feet.  ?Musculoskeletal: Patient reports right thigh pain.  Denies decrease in range of motion, difficulty with gait, or joint pain and swelling.  ?Skin: Denies redness, rashes, lesions or ulcercations.  ?Neurological: Denies numbness, tingling, burning, weakness or problems with balance and coordination.  ? ? ?No other specific complaints in a complete review of systems (except as listed in HPI above). ? ?   ?Objective:  ? Physical Exam ? ?BP 116/64 (BP Location: Left Arm, Patient Position: Sitting, Cuff Size: Large)   Pulse (!) 59   Temp (!) 97.3 ?F (36.3 ?C) (Temporal)   Wt 169 lb (76.7 kg)   SpO2 98%   BMI 29.01 kg/m?  ? ?Wt Readings from Last 3 Encounters:   ?08/08/21 169 lb (76.7 kg)  ?07/29/21 169 lb 2 oz (76.7 kg)  ?06/27/21 169 lb (76.7 kg)  ? ? ?General: Appears their stated age, well developed, well nourished in NAD. ?Cardiovascular: Bradycardic with normal rhythm. S1,S2 noted.  No murmur, rubs or gallops noted.  She has spider veins to her right lateral thigh and varicose veins to her right medial thigh. ?Pulmonary/Chest: Normal effort and positive vesicular breath sounds. No respiratory distress. No wheezes, rales or ronchi noted.  ?Musculoskeletal: Normal flexion, extension, rotation and lateral bending of the spine.  No pain with palpation of the lumbar spine or right SI joint.  Normal abduction, abduction internal and external rotation of the right hip.  Pain with palpation of the right trochanter and right mid quadricep.  Strength 5/5 BLE.  Able to stand on tiptoes and heels.  No difficulty with gait. ?Neurological: Alert and oriented.  Negative SLR on the right. ? ? ? ?BMET ?   ?Component Value Date/Time  ? NA 135 06/06/2021 2244  ? K 3.8 06/06/2021 2244  ? CL 104 06/06/2021 2244  ? CO2 24 06/06/2021 2244  ? GLUCOSE 101 (H) 06/06/2021 2244  ? BUN 12 06/06/2021 2244  ? CREATININE 0.77 06/06/2021 2244  ? CREATININE 0.90 05/30/2021 1532  ? CALCIUM 9.1 06/06/2021 2244  ? GFRNONAA >60 06/06/2021 2244  ? GFRAA >60 01/24/2018 1056  ? ? ?Lipid Panel  ?   ?Component Value Date/Time  ? CHOL 189 03/20/2021 0934  ? TRIG 70 03/20/2021 0934  ? HDL 83 03/20/2021 0934  ? CHOLHDL 2.3 03/20/2021 0934  ? LDLCALC 90 03/20/2021 0934  ? ? ?CBC ?   ?Component Value Date/Time  ? WBC 6.5 06/06/2021 2244  ? RBC 4.10 06/06/2021 2244  ? HGB 12.7 06/06/2021 2244  ? HCT 37.8 06/06/2021 2244  ? PLT 286 06/06/2021 2244  ? MCV 92.2 06/06/2021 2244  ? MCH 31.0 06/06/2021 2244  ? MCHC 33.6 06/06/2021 2244  ? RDW 12.2 06/06/2021 2244  ? LYMPHSABS 2.2 01/24/2018 1056  ? MONOABS 0.9 01/24/2018 1056  ? EOSABS 0.1 01/24/2018 1056  ? BASOSABS 0.1 01/24/2018 1056  ? ? ?Hgb A1C ?No results found  for: HGBA1C ? ? ? ? ? ?   ?Assessment & Plan:  ? ?Right Thigh Pain, Quadricep Strain: ? ?Imaging reviewed ?She has not tried ice or anti-inflammatories as discussed prior ?She feels like Tylenol is more effective ?Referral to PT for further evaluation and treatment ?Exam does not seem consistent with sciatica but could consider x-ray in the lumbar spine as well ?Could consider referral to vascular for evaluation of spider and varicose  veins as these could be contributing to her symptoms ? ?RTC in 6 months for your annual exam ?Nicki Reaper, NP ? ?

## 2021-09-09 DIAGNOSIS — Z1211 Encounter for screening for malignant neoplasm of colon: Secondary | ICD-10-CM | POA: Diagnosis not present

## 2021-09-14 ENCOUNTER — Other Ambulatory Visit: Payer: Self-pay | Admitting: Internal Medicine

## 2021-09-16 NOTE — Telephone Encounter (Signed)
Requested Prescriptions  ?Pending Prescriptions Disp Refills  ?? amLODipine (NORVASC) 2.5 MG tablet [Pharmacy Med Name: AMLODIPINE BESYLATE 2.5MG  TABLETS] 90 tablet 0  ?  Sig: TAKE 1 TABLET(2.5 MG) BY MOUTH DAILY  ?  ? Cardiovascular: Calcium Channel Blockers 2 Passed - 09/14/2021  5:52 PM  ?  ?  Passed - Last BP in normal range  ?  BP Readings from Last 1 Encounters:  ?09/05/21 116/64  ?   ?  ?  Passed - Last Heart Rate in normal range  ?  Pulse Readings from Last 1 Encounters:  ?09/05/21 (!) 59  ?   ?  ?  Passed - Valid encounter within last 6 months  ?  Recent Outpatient Visits   ?      ? 1 week ago Right thigh pain  ? [facility] [place], [place] W, NP  ? 1 month ago Right hip pain  ? [facility] [employer], [place] W, NP  ? 2 months ago Primary hypertension  ? [facility] [place], [place] W, NP  ? 3 months ago Primary hypertension  ? [place], NP  ? 3 months ago Palpitations  ? [facility], [place]alie Keens, NP  ?  ?  ? ?  ?  ?  ? ?

## 2021-09-17 DIAGNOSIS — M6281 Muscle weakness (generalized): Secondary | ICD-10-CM | POA: Diagnosis not present

## 2021-09-17 DIAGNOSIS — M79651 Pain in right thigh: Secondary | ICD-10-CM | POA: Diagnosis not present

## 2021-09-23 ENCOUNTER — Encounter: Payer: Self-pay | Admitting: Internal Medicine

## 2021-09-23 NOTE — Telephone Encounter (Signed)
Yes, she needs an appt for this. ?

## 2021-09-24 ENCOUNTER — Other Ambulatory Visit: Payer: Self-pay | Admitting: Internal Medicine

## 2021-09-25 ENCOUNTER — Ambulatory Visit (INDEPENDENT_AMBULATORY_CARE_PROVIDER_SITE_OTHER): Payer: Medicare Other | Admitting: Internal Medicine

## 2021-09-25 ENCOUNTER — Encounter: Payer: Self-pay | Admitting: Internal Medicine

## 2021-09-25 VITALS — BP 116/58 | HR 56 | Temp 96.6°F | Ht 64.0 in | Wt 168.0 lb

## 2021-09-25 DIAGNOSIS — Z0184 Encounter for antibody response examination: Secondary | ICD-10-CM

## 2021-09-25 DIAGNOSIS — Z6828 Body mass index (BMI) 28.0-28.9, adult: Secondary | ICD-10-CM

## 2021-09-25 DIAGNOSIS — Z0001 Encounter for general adult medical examination with abnormal findings: Secondary | ICD-10-CM

## 2021-09-25 DIAGNOSIS — Z111 Encounter for screening for respiratory tuberculosis: Secondary | ICD-10-CM

## 2021-09-25 DIAGNOSIS — E663 Overweight: Secondary | ICD-10-CM | POA: Diagnosis not present

## 2021-09-25 NOTE — Progress Notes (Addendum)
? ?Subjective:  ? ? Patient ID: Anita Mathews, female    DOB: 12/26/1950, 71 y.o.   MRN: [id number] ? ?HPI ? ?Patient presents to clinic today for her annual exam.  She also has forms that she would like for completed for her employer. ? ?Flu: 03/2021 ?Tetanus: 11/2012 ?COVID: Pfizer x4 ?Pneumovax: 01/2019 ?Prevnar: 05/2017 ?Shingrix: 1 dose, [facility] [place] ?Pap smear: 07/2020 ?Mammogram: 07/2021 ?Bone density: 04/2020 ?Colon screening: 04/2019 ?Vision screening: annually ?Dentist: biannually ? ?Diet: She rarely eats meat. She consumes more veggies than fruits. She tries to avoid fried foods. She drinks mostly water, seltzer. ?Exercise: Walking ? ? ?Review of Systems ? ?   ?Past Medical History:  ?Diagnosis Date  ? Arthritis   ? Glaucoma   ? ? ?Current Outpatient Medications  ?Medication Sig Dispense Refill  ? alendronate (FOSAMAX) 70 MG tablet Take 1 tablet (70 mg total) by mouth once a week. Take with a full glass of water on an empty stomach. 12 tablet 3  ? amLODipine (NORVASC) 2.5 MG tablet TAKE 1 TABLET(2.5 MG) BY MOUTH DAILY 90 tablet 0  ? Biotin 1 MG CAPS Take 1 capsule by mouth daily.    ? calcium citrate-vitamin D (CITRACAL+D) 315-200 MG-UNIT tablet Take 1 tablet by mouth every morning.    ? cyanocobalamin 100 MCG tablet     ? dorzolamide-timolol (COSOPT) 22.3-6.8 MG/ML ophthalmic solution Apply to eye.    ? ferrous gluconate (FERGON) 324 MG tablet Take 324 mg by mouth every other day.    ? hydrALAZINE (APRESOLINE) 25 MG tablet TAKE 1 TABLET BY MOUTH THREE TIMES DAILY AS NEEDED FOR SYSTOLIC BLOOD PRESSURE 272 tablet 0  ? Omega-3 Fatty Acids (FISH OIL) 1000 MG CAPS Take 1 capsule by mouth daily.    ? OPTIVE 0.5-0.9 % ophthalmic solution SMARTSIG:1 Drop(s) In Eye(s) PRN    ? propranolol (INDERAL) 10 MG tablet TAKE 1 TABLET(10 MG) BY MOUTH UP TO THREE TIMES DAILY AS NEEDED FOR FAST HEART RATE 270 tablet 0  ? rosuvastatin (CRESTOR) 5 MG tablet Take 1 tablet (5 mg total) by mouth daily. 90 tablet 1  ? Secukinumab  (COSENTYX) 150 MG/ML SOSY Inject 150 mg into the skin every 30 (thirty) days.    ? ?No current facility-administered medications for this visit.  ? ? ?No Known Allergies ? ?Family History  ?Problem Relation Age of Onset  ? Breast cancer Mother 82  ? Breast cancer Paternal Grandmother   ? ? ?Social History  ? ?Socioeconomic History  ? Marital status: Divorced  ?  Spouse name: Not on file  ? Number of children: Not on file  ? Years of education: Not on file  ? Highest education level: Not on file  ?Occupational History  ? Not on file  ?Tobacco Use  ? Smoking status: Never  ? Smokeless tobacco: Never  ?Vaping Use  ? Vaping Use: Never used  ?Substance and Sexual Activity  ? Alcohol use: Yes  ?  Comment: occasional  ? Drug use: Never  ? Sexual activity: Not on file  ?Other Topics Concern  ? Not on file  ?Social History Narrative  ? Not on file  ? ?Social Determinants of Health  ? ?Financial Resource Strain: Not on file  ?Food Insecurity: Not on file  ?Transportation Needs: Not on file  ?Physical Activity: Not on file  ?Stress: Not on file  ?Social Connections: Not on file  ?Intimate Partner Violence: Not on file  ? ? ? ?Constitutional: Denies fever, malaise,  fatigue, headache or abrupt weight changes.  ?HEENT: Patient reports hearing loss.  Denies eye pain, eye redness, ear pain, ringing in the ears, wax buildup, runny nose, nasal congestion, bloody nose, or sore throat. ?Respiratory: Denies difficulty breathing, shortness of breath, cough or sputum production.   ?Cardiovascular: Denies chest pain, chest tightness, palpitations or swelling in the hands or feet.  ?Gastrointestinal: Denies abdominal pain, bloating, constipation, diarrhea or blood in the stool.  ?GU: Denies urgency, frequency, pain with urination, burning sensation, blood in urine, odor or discharge. ?Musculoskeletal: Patient reports joint pain.  Denies decrease in range of motion, difficulty with gait, muscle pain or joint swelling.  ?Skin: Denies redness,  rashes, lesions or ulcercations.  ?Neurological: Denies dizziness, difficulty with memory, difficulty with speech or problems with balance and coordination.  ?Psych: Denies anxiety, depression, SI/HI. ? ?No other specific complaints in a complete review of systems (except as listed in HPI above). ? ?Objective:  ? Physical Exam ? ?BP (!) 116/58 (BP Location: Left Arm, Patient Position: Sitting, Cuff Size: Large)   Pulse (!) 56   Temp (!) 96.6 ?F (35.9 ?C) (Temporal)   Ht _0  (1.626 m)   Wt 168 lb (76.2 kg)   SpO2 98%   BMI 28.84 kg/m?  ? ?Wt Readings from Last 3 Encounters:  ?09/05/21 169 lb (76.7 kg)  ?08/08/21 169 lb (76.7 kg)  ?07/29/21 169 lb 2 oz (76.7 kg)  ? ? ?General: Appears her stated age, overweight, in NAD. ?Skin: Warm, dry and intact.  ?HEENT: Head: normal shape and size; Eyes: sclera white, no icterus, conjunctiva pink, PERRLA and EOMs intact; Ears: Wears hearing aids. ?Neck:  Neck supple, trachea midline. No masses, lumps or thyromegaly present.  ?Cardiovascular: Bradycardic with normal rhythm. S1,S2 noted.  No murmur, rubs or gallops noted. No JVD or BLE edema. No carotid bruits noted. ?Pulmonary/Chest: Normal effort and positive vesicular breath sounds. No respiratory distress. No wheezes, rales or ronchi noted.  ?Abdomen: Normal bowel sounds. ?Musculoskeletal: Strength 5/5 BUE/BLE.  No difficulty with gait.  ?Neurological: Alert and oriented. Cranial nerves II-XII grossly intact. Coordination normal.  ?Psychiatric: Mood and affect normal. Behavior is normal. Judgment and thought content normal.  ? ?BMET ?   ?Component Value Date/Time  ? NA 135 06/06/2021 2244  ? K 3.8 06/06/2021 2244  ? CL 104 06/06/2021 2244  ? CO2 24 06/06/2021 2244  ? GLUCOSE 101 (H) 06/06/2021 2244  ? BUN 12 06/06/2021 2244  ? CREATININE 0.77 06/06/2021 2244  ? CREATININE 0.90 05/30/2021 1532  ? CALCIUM 9.1 06/06/2021 2244  ? GFRNONAA >60 06/06/2021 2244  ? GFRAA >60 01/24/2018 1056  ? ? ?Lipid Panel  ?   ?Component  Value Date/Time  ? CHOL 189 03/20/2021 0934  ? TRIG 70 03/20/2021 0934  ? HDL 83 03/20/2021 0934  ? CHOLHDL 2.3 03/20/2021 0934  ? [place] 90 03/20/2021 0934  ? ? ?CBC ?   ?Component Value Date/Time  ? WBC 6.5 06/06/2021 2244  ? RBC 4.10 06/06/2021 2244  ? HGB 12.7 06/06/2021 2244  ? HCT 37.8 06/06/2021 2244  ? PLT 286 06/06/2021 2244  ? MCV 92.2 06/06/2021 2244  ? MCH 31.0 06/06/2021 2244  ? MCHC 33.6 06/06/2021 2244  ? RDW 12.2 06/06/2021 2244  ? LYMPHSABS 2.2 01/24/2018 1056  ? MONOABS 0.9 01/24/2018 1056  ? EOSABS 0.1 01/24/2018 1056  ? BASOSABS 0.1 01/24/2018 1056  ? ? ?Hgb A1C ?No results found for: HGBA1C ? ? ? ? ? ?   ?  Assessment & Plan:  ? ?Preventative Health Maintenance: ? ?Encouraged her to get a flu shot in the fall ?Tetanus UTD ?Pneumovax and Prevnar UTD ?COVID UTD ?Discussed Shingrix vaccine, she will check coverage with her insurance company and have this done at the pharmacy if she would like ?She no longer needs Pap smears ?Mammogram UTD ?Bone density UTD ?Colon screening UTD ?Encouraged her to consume a balanced diet and exercise regimen ?Advised her to see an eye doctor and dentist annually ?We will check labs at her next follow-up visit ? ?Encounter for Form Completion with Patient: ? ?Forms completed, copy scanned in the chart and original given to patient ?We will need MMR, polio titers and TB Gold assay ? ?RTC in 6 months for follow-up of chronic conditions ?Webb Silversmith, NP ? ? ?

## 2021-09-25 NOTE — Patient Instructions (Signed)
Health Maintenance for Postmenopausal Women Menopause is a normal process in which your ability to get pregnant comes to an end. This process happens slowly over many months or years, usually between the ages of 48 and 55. Menopause is complete when you have missed your menstrual period for 12 months. It is important to talk with your health care provider about some of the most common conditions that affect women after menopause (postmenopausal women). These include heart disease, cancer, and bone loss (osteoporosis). Adopting a healthy lifestyle and getting preventive care can help to promote your health and wellness. The actions you take can also lower your chances of developing some of these common conditions. What are the signs and symptoms of menopause? During menopause, you may have the following symptoms: Hot flashes. These can be moderate or severe. Night sweats. Decrease in sex drive. Mood swings. Headaches. Tiredness (fatigue). Irritability. Memory problems. Problems falling asleep or staying asleep. Talk with your health care provider about treatment options for your symptoms. Do I need hormone replacement therapy? Hormone replacement therapy is effective in treating symptoms that are caused by menopause, such as hot flashes and night sweats. Hormone replacement carries certain risks, especially as you become older. If you are thinking about using estrogen or estrogen with progestin, discuss the benefits and risks with your health care provider. How can I reduce my risk for heart disease and stroke? The risk of heart disease, heart attack, and stroke increases as you age. One of the causes may be a change in the body's hormones during menopause. This can affect how your body uses dietary fats, triglycerides, and cholesterol. Heart attack and stroke are medical emergencies. There are many things that you can do to help prevent heart disease and stroke. Watch your blood pressure High  blood pressure causes heart disease and increases the risk of stroke. This is more likely to develop in people who have high blood pressure readings or are overweight. Have your blood pressure checked: Every 3-5 years if you are 18-39 years of age. Every year if you are 40 years old or older. Eat a healthy diet  Eat a diet that includes plenty of vegetables, fruits, low-fat dairy products, and lean protein. Do not eat a lot of foods that are high in solid fats, added sugars, or sodium. Get regular exercise Get regular exercise. This is one of the most important things you can do for your health. Most adults should: Try to exercise for at least 150 minutes each week. The exercise should increase your heart rate and make you sweat (moderate-intensity exercise). Try to do strengthening exercises at least twice each week. Do these in addition to the moderate-intensity exercise. Spend less time sitting. Even light physical activity can be beneficial. Other tips Work with your health care provider to achieve or maintain a healthy weight. Do not use any products that contain nicotine or tobacco. These products include cigarettes, chewing tobacco, and vaping devices, such as e-cigarettes. If you need help quitting, ask your health care provider. Know your numbers. Ask your health care provider to check your cholesterol and your blood sugar (glucose). Continue to have your blood tested as directed by your health care provider. Do I need screening for cancer? Depending on your health history and family history, you may need to have cancer screenings at different stages of your life. This may include screening for: Breast cancer. Cervical cancer. Lung cancer. Colorectal cancer. What is my risk for osteoporosis? After menopause, you may be   at increased risk for osteoporosis. Osteoporosis is a condition in which bone destruction happens more quickly than new bone creation. To help prevent osteoporosis or  the bone fractures that can happen because of osteoporosis, you may take the following actions: If you are 19-50 years old, get at least 1,000 mg of calcium and at least 600 international units (IU) of vitamin D per day. If you are older than age 50 but younger than age 70, get at least 1,200 mg of calcium and at least 600 international units (IU) of vitamin D per day. If you are older than age 70, get at least 1,200 mg of calcium and at least 800 international units (IU) of vitamin D per day. Smoking and drinking excessive alcohol increase the risk of osteoporosis. Eat foods that are rich in calcium and vitamin D, and do weight-bearing exercises several times each week as directed by your health care provider. How does menopause affect my mental health? Depression may occur at any age, but it is more common as you become older. Common symptoms of depression include: Feeling depressed. Changes in sleep patterns. Changes in appetite or eating patterns. Feeling an overall lack of motivation or enjoyment of activities that you previously enjoyed. Frequent crying spells. Talk with your health care provider if you think that you are experiencing any of these symptoms. General instructions See your health care provider for regular wellness exams and vaccines. This may include: Scheduling regular health, dental, and eye exams. Getting and maintaining your vaccines. These include: Influenza vaccine. Get this vaccine each year before the flu season begins. Pneumonia vaccine. Shingles vaccine. Tetanus, diphtheria, and pertussis (Tdap) booster vaccine. Your health care provider may also recommend other immunizations. Tell your health care provider if you have ever been abused or do not feel safe at home. Summary Menopause is a normal process in which your ability to get pregnant comes to an end. This condition causes hot flashes, night sweats, decreased interest in sex, mood swings, headaches, or lack  of sleep. Treatment for this condition may include hormone replacement therapy. Take actions to keep yourself healthy, including exercising regularly, eating a healthy diet, watching your weight, and checking your blood pressure and blood sugar levels. Get screened for cancer and depression. Make sure that you are up to date with all your vaccines. This information is not intended to replace advice given to you by your health care provider. Make sure you discuss any questions you have with your health care provider. Document Revised: 10/08/2020 Document Reviewed: 10/08/2020 Elsevier Patient Education  2023 Elsevier Inc.  

## 2021-09-25 NOTE — Assessment & Plan Note (Signed)
Encourage diet and exercise for weight loss 

## 2021-09-26 DIAGNOSIS — M6281 Muscle weakness (generalized): Secondary | ICD-10-CM | POA: Diagnosis not present

## 2021-09-26 DIAGNOSIS — M79651 Pain in right thigh: Secondary | ICD-10-CM | POA: Diagnosis not present

## 2021-09-26 NOTE — Telephone Encounter (Signed)
Requested medication (s) are due for refill today: yes ? ?Requested medication (s) are on the active medication list: no ? ?Last refill:  06/19/21 ? ?Future visit scheduled: no ? ?Notes to clinic:  Unable to refill per protocol, Rx expired.  Medication was discontinued 06/21/21 by PCP. ? ? ?  ?Requested Prescriptions  ?Pending Prescriptions Disp Refills  ? lisinopril (ZESTRIL) 5 MG tablet [Pharmacy Med Name: LISINOPRIL 5MG  TABLETS] 135 tablet 0  ?  Sig: TAKE 1 AND 1/2 TABLETS(7.5 MG) BY MOUTH DAILY  ?  ? Cardiovascular:  ACE Inhibitors Passed - 09/24/2021 11:00 AM  ?  ?  Passed - Cr in normal range and within 180 days  ?  Creat  ?Date Value Ref Range Status  ?05/30/2021 0.90 0.60 - 1.00 mg/dL Final  ? ?Creatinine, Ser  ?Date Value Ref Range Status  ?06/06/2021 0.77 0.44 - 1.00 mg/dL Final  ?  ?  ?  ?  Passed - K in normal range and within 180 days  ?  Potassium  ?Date Value Ref Range Status  ?06/06/2021 3.8 3.5 - 5.1 mmol/L Final  ?  ?  ?  ?  Passed - Patient is not pregnant  ?  ?  Passed - Last BP in normal range  ?  BP Readings from Last 1 Encounters:  ?09/25/21 (!) 116/58  ?  ?  ?  ?  Passed - Valid encounter within last 6 months  ?  Recent Outpatient Visits   ? ?      ? Yesterday Encounter for general adult medical examination with abnormal findings  ? [facility] [place], Mullins W, NP  ? 3 weeks ago Right thigh pain  ? [facility] [place], Mullins W, NP  ? 1 month ago Right hip pain  ? [facility] [place], Mullins W, NP  ? 3 months ago Primary hypertension  ? [facility] [place], Mullins W, NP  ? 3 months ago Primary hypertension  ? [facility] [place], Mullins, NP  ? ?  ?  ? ? ?  ?  ?  ? ? ?

## 2021-09-28 ENCOUNTER — Encounter: Payer: Self-pay | Admitting: Internal Medicine

## 2021-10-01 DIAGNOSIS — L409 Psoriasis, unspecified: Secondary | ICD-10-CM | POA: Diagnosis not present

## 2021-10-01 DIAGNOSIS — M19041 Primary osteoarthritis, right hand: Secondary | ICD-10-CM | POA: Diagnosis not present

## 2021-10-01 DIAGNOSIS — M19042 Primary osteoarthritis, left hand: Secondary | ICD-10-CM | POA: Diagnosis not present

## 2021-10-01 DIAGNOSIS — Z7960 Long term (current) use of unspecified immunomodulators and immunosuppressants: Secondary | ICD-10-CM | POA: Diagnosis not present

## 2021-10-01 DIAGNOSIS — L4050 Arthropathic psoriasis, unspecified: Secondary | ICD-10-CM | POA: Diagnosis not present

## 2021-10-02 LAB — MEASLES/MUMPS/RUBELLA IMMUNITY
Mumps IgG: 19.5 AU/mL
Rubella: 9.94 Index
Rubeola IgG: 65.6 AU/mL

## 2021-10-02 LAB — QUANTIFERON-TB GOLD PLUS
Mitogen-NIL: >10 IU/mL
NIL: 0.03 IU/mL
QuantiFERON-TB Gold Plus: NEGATIVE
TB1-NIL: 0 IU/mL
TB2-NIL: 0 IU/mL

## 2021-10-02 LAB — POLIOVIRUS (1,3) ABS, NEUTRALIZ.
Polio 1 Titer: >1:128 {titer}
Polio 3 Titer: >1:128 {titer}

## 2021-10-04 DIAGNOSIS — M6281 Muscle weakness (generalized): Secondary | ICD-10-CM | POA: Diagnosis not present

## 2021-10-04 DIAGNOSIS — M79651 Pain in right thigh: Secondary | ICD-10-CM | POA: Diagnosis not present

## 2021-10-20 ENCOUNTER — Encounter: Payer: Self-pay | Admitting: Internal Medicine

## 2021-10-21 MED ORDER — ROSUVASTATIN CALCIUM 5 MG PO TABS: 5.0000 mg | ORAL_TABLET | Freq: Every day | ORAL | 1 refills | Status: DC

## 2021-10-25 DIAGNOSIS — M6281 Muscle weakness (generalized): Secondary | ICD-10-CM | POA: Diagnosis not present

## 2021-10-25 DIAGNOSIS — M79651 Pain in right thigh: Secondary | ICD-10-CM | POA: Diagnosis not present

## 2021-10-29 DIAGNOSIS — Z961 Presence of intraocular lens: Secondary | ICD-10-CM | POA: Diagnosis not present

## 2021-10-29 DIAGNOSIS — H401122 Primary open-angle glaucoma, left eye, moderate stage: Secondary | ICD-10-CM | POA: Diagnosis not present

## 2021-10-29 DIAGNOSIS — H04123 Dry eye syndrome of bilateral lacrimal glands: Secondary | ICD-10-CM | POA: Diagnosis not present

## 2021-10-29 DIAGNOSIS — H40001 Preglaucoma, unspecified, right eye: Secondary | ICD-10-CM | POA: Diagnosis not present

## 2021-11-07 DIAGNOSIS — M79651 Pain in right thigh: Secondary | ICD-10-CM | POA: Diagnosis not present

## 2021-11-15 ENCOUNTER — Ambulatory Visit: Payer: Medicare Other | Admitting: Physician Assistant

## 2021-11-20 DIAGNOSIS — M79651 Pain in right thigh: Secondary | ICD-10-CM | POA: Diagnosis not present

## 2021-11-20 DIAGNOSIS — M6281 Muscle weakness (generalized): Secondary | ICD-10-CM | POA: Diagnosis not present

## 2021-11-27 DIAGNOSIS — Z96612 Presence of left artificial shoulder joint: Secondary | ICD-10-CM | POA: Diagnosis not present

## 2021-11-27 DIAGNOSIS — Z471 Aftercare following joint replacement surgery: Secondary | ICD-10-CM | POA: Diagnosis not present

## 2021-11-29 ENCOUNTER — Telehealth (INDEPENDENT_AMBULATORY_CARE_PROVIDER_SITE_OTHER): Payer: Medicare Other | Admitting: Internal Medicine

## 2021-11-29 ENCOUNTER — Encounter: Payer: Self-pay | Admitting: Internal Medicine

## 2021-11-29 DIAGNOSIS — J329 Chronic sinusitis, unspecified: Secondary | ICD-10-CM | POA: Diagnosis not present

## 2021-11-29 DIAGNOSIS — B9789 Other viral agents as the cause of diseases classified elsewhere: Secondary | ICD-10-CM | POA: Diagnosis not present

## 2021-11-29 MED ORDER — PREDNISONE 10 MG PO TABS: ORAL_TABLET | ORAL | 0 refills | Status: DC

## 2021-11-29 MED ORDER — AMOXICILLIN-POT CLAVULANATE 875-125 MG PO TABS: 1.0000 | ORAL_TABLET | Freq: Two times a day (BID) | ORAL | 0 refills | Status: DC

## 2021-11-29 NOTE — Patient Instructions (Signed)
Sinus Pain  Sinus pain may occur when your sinuses become clogged or swollen. Sinuses are air-filled spaces in your skull that are behind the bones of your face and forehead. Sinus pain can range from mild to severe. What are the causes? Sinus pain can result from various conditions that affect the sinuses. Common causes include: Colds. Sinus infections. Allergies. What are the signs or symptoms? The main symptom of this condition is pain or pressure in your face, forehead, ears, or upper teeth. People who have sinus pain often have other symptoms, such as: Congested or runny nose. Fever. Inability to smell. Headache. Weather changes can make symptoms worse. How is this diagnosed? Your health care provider will diagnose this condition based on your symptoms and a physical exam. If you have pain that keeps coming back or does not go away, your health care provider may recommend more testing. This may include: Imaging tests, such as a CT scan or MRI, to check for problems with your sinuses. Examination of your sinuses using a thin tool with a camera that is inserted through your nose (endoscopy). How is this treated? Treatment for this condition depends on the cause. Sinus pain that is caused by a sinus infection may be treated with antibiotic medicine. Sinus pain that is caused by congestion may be helped by rinsing out (flushing) the nose and sinuses with saline solution. Sinus pain that is caused by allergies may be helped by allergy medicines (antihistamines) and medicated nasal sprays. Sinus surgery may be needed in some cases if other treatments do not help. Follow these instructions at home: General instructions If directed: Apply a warm, moist washcloth to your face to help relieve pain. Use a nasal saline wash. Follow the directions on the bottle or box. Hydrate and humidify Drink enough water to keep your urine clear or pale yellow. Staying hydrated will help to thin your  mucus. Use a humidifier if your home is dry. Inhale steam for 10-15 minutes, 3-4 times a day or as told by your health care provider. You can do this in the bathroom while a hot shower is running. Limit your exposure to cool or dry air. Medicines  Take over-the-counter and prescription medicines only as told by your health care provider. If you were prescribed an antibiotic medicine, take it as told by your health care provider. Do not stop taking the antibiotic even if you start to feel better. If you have congestion, use a nasal spray to help lessen pressure. Contact a health care provider if: You have sinus pain more than one time a week. You have sensitivity to light or sound. You develop a fever. You feel nauseous or you vomit. Your sinus pain or headache does not get better with treatment. Get help right away if: You have vision problems. You have sudden, severe pain in your face or head. You have a seizure. You are confused. You have a stiff neck. Summary Sinus pain occurs when your sinuses become clogged or swollen. Sinus pain can result from various conditions that affect the sinuses, such as a cold, a sinus infection, or an allergy. Treatment for this condition depends on the cause. It may include medicine, such as antibiotics or antihistamines. This information is not intended to replace advice given to you by your health care provider. Make sure you discuss any questions you have with your health care provider. Document Revised: 04/21/2021 Document Reviewed: 04/21/2021 Elsevier Patient Education  2023 [employer]vier Inc.  

## 2021-11-29 NOTE — Progress Notes (Signed)
Virtual Visit via Video Note  I connected with Anita Mathews on 11/29/21 at 10:00 AM EDT by a video enabled telemedicine application and verified that I am speaking with the correct person using two identifiers.  Location: Patient: Home Provider: Office  Person's participating in this video call: Nicki Reaper, NP and Redge Gainer   I discussed the limitations of evaluation and management by telemedicine and the availability of in person appointments. The patient expressed understanding and agreed to proceed.  History of Present Illness:  Patient reports fatigue, headache, nasal congestion and cough.  She reports this started 3 weeks ago.  The headache is located in her forehead and in the back of her head.  She describes the pain as pressure. She is blowing thick, yellow mucous out of her nose. The cough is mostly non productive. She denies runny nose, ear pain, sore throat or shortness of breath. She denies fever, chills or body aches. She has taken her BP yesterday, it was 144/50. She has been taking Sudafed and Ibuprofen OTC with minimal relief of symptoms. 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    Past Medical History:  Diagnosis Date   Arthritis    Glaucoma     Current Outpatient Medications  Medication Sig Dispense Refill   alendronate (FOSAMAX) 70 MG tablet Take 1 tablet (70 mg total) by mouth once a week. Take with a full glass of water on an empty stomach. 12 tablet 3   amLODipine (NORVASC) 2.5 MG tablet TAKE 1 TABLET(2.5 MG) BY MOUTH DAILY 90 tablet 0   Biotin 1 MG CAPS Take 1 capsule by mouth daily.     calcium citrate-vitamin D (CITRACAL+D) 315-200 MG-UNIT tablet Take 1 tablet by mouth every morning.     cyanocobalamin 100 MCG tablet      dorzolamide-timolol (COSOPT) 22.3-6.8 MG/ML ophthalmic solution Apply to eye.     ferrous gluconate (FERGON) 324 MG tablet Take 324 mg by mouth every other day.     hydrALAZINE  (APRESOLINE) 25 MG tablet TAKE 1 TABLET BY MOUTH THREE TIMES DAILY AS NEEDED FOR SYSTOLIC BLOOD PRESSURE 270 tablet 0   Omega-3 Fatty Acids (FISH OIL) 1000 MG CAPS Take 1 capsule by mouth daily.     OPTIVE 0.5-0.9 % ophthalmic solution SMARTSIG:1 Drop(s) In Eye(s) PRN     propranolol (INDERAL) 10 MG tablet TAKE 1 TABLET(10 MG) BY MOUTH UP TO THREE TIMES DAILY AS NEEDED FOR FAST HEART RATE 270 tablet 0   rosuvastatin (CRESTOR) 5 MG tablet Take 1 tablet (5 mg total) by mouth daily. 90 tablet 1   Secukinumab (COSENTYX) 150 MG/ML SOSY Inject 150 mg into the skin every 30 (thirty) days.     No current facility-administered medications for this visit.    No Known Allergies  Family History  Problem Relation Age of Onset   Breast cancer Mother 71   Breast cancer Paternal Grandmother     Social History   Socioeconomic History   Marital status: Divorced    Spouse name: Not on file   Number of children: Not on file   Years of education: Not on file   Highest education level: Not on file  Occupational History   Not on file  Tobacco Use   Smoking status: Never   Smokeless tobacco: Never  Vaping Use   Vaping Use: Never used  Substance and Sexual Activity   Alcohol use: Yes    Comment: occasional  Drug use: Never   Sexual activity: Not on file  Other Topics Concern   Not on file  Social History Narrative   Not on file   Social Determinants of Health   Financial Resource Strain: Not on file  Food Insecurity: Not on file  Transportation Needs: Not on file  Physical Activity: Not on file  Stress: Not on file  Social Connections: Not on file  Intimate Partner Violence: Not on file     Constitutional: Patient reports headache and fatigue.  Denies fever, malaise, or abrupt weight changes.  HEENT: Patient reports nasal congestion.  Denies eye pain, eye redness, ear pain, ringing in the ears, wax buildup, runny nose, bloody nose, or sore throat. Respiratory: Patient reports cough.   Denies difficulty breathing, shortness of breath, or sputum production.   Cardiovascular: Denies chest pain, chest tightness, palpitations or swelling in the hands or feet.  Gastrointestinal: Denies abdominal pain, bloating, constipation, diarrhea or blood in the stool.  Neurological: Denies dizziness, difficulty with memory, difficulty with speech or problems with balance and coordination.    No other specific complaints in a complete review of systems (except as listed in HPI above).  Observations/Objective:  Wt Readings from Last 3 Encounters:  09/25/21 168 lb (76.2 kg)  09/05/21 169 lb (76.7 kg)  08/08/21 169 lb (76.7 kg)    General: Appears her stated age, in NAD. HEENT: Head: normal shape and size;  Nose: Congestion noted; Throat/Mouth: No hoarseness noted. Pulmonary/Chest: Normal effort. No respiratory distress. Neurological: Alert and oriented.   BMET    Component Value Date/Time   NA 135 06/06/2021 2244   K 3.8 06/06/2021 2244   CL 104 06/06/2021 2244   CO2 24 06/06/2021 2244   GLUCOSE 101 (H) 06/06/2021 2244   BUN 12 06/06/2021 2244   CREATININE 0.77 06/06/2021 2244   CREATININE 0.90 05/30/2021 1532   CALCIUM 9.1 06/06/2021 2244   GFRNONAA >60 06/06/2021 2244   GFRAA >60 01/24/2018 1056    Lipid Panel     Component Value Date/Time   CHOL 189 03/20/2021 0934   TRIG 70 03/20/2021 0934   HDL 83 03/20/2021 0934   CHOLHDL 2.3 03/20/2021 0934   LDLCALC 90 03/20/2021 0934    CBC    Component Value Date/Time   WBC 6.5 06/06/2021 2244   RBC 4.10 06/06/2021 2244   HGB 12.7 06/06/2021 2244   HCT 37.8 06/06/2021 2244   PLT 286 06/06/2021 2244   MCV 92.2 06/06/2021 2244   MCH 31.0 06/06/2021 2244   MCHC 33.6 06/06/2021 2244   RDW 12.2 06/06/2021 2244   LYMPHSABS 2.2 01/24/2018 1056   MONOABS 0.9 01/24/2018 1056   EOSABS 0.1 01/24/2018 1056   BASOSABS 0.1 01/24/2018 1056    Hgb A1C No results found for: "HGBA1C"      Assessment and Plan:  Viral  Sinusitis:  Advised her at this point to stop the decongestants She can use a Nettie pot which can be purchased from her local pharmacy Rx for Pred taper x6 days If no improvement in about 3 days, would recommend starting antibiotics Rx for Augmentin 875-125 mg p.o. twice daily x10 days  Update me for new or worsening symptoms. Follow Up Instructions:    I discussed the assessment and treatment plan with the patient. The patient was provided an opportunity to ask questions and all were answered. The patient agreed with the plan and demonstrated an understanding of the instructions.   The patient was advised to call  back or seek an in-person evaluation if the symptoms worsen or if the condition fails to improve as anticipated.  RTC in 3 months for follow-up of chronic conditions  Nicki Reaper, NP

## 2021-12-01 ENCOUNTER — Encounter: Payer: Self-pay | Admitting: Internal Medicine

## 2021-12-02 MED ORDER — ALENDRONATE SODIUM 70 MG PO TABS: 70.0000 mg | ORAL_TABLET | ORAL | 1 refills | Status: DC

## 2021-12-09 DIAGNOSIS — H401123 Primary open-angle glaucoma, left eye, severe stage: Secondary | ICD-10-CM | POA: Diagnosis not present

## 2021-12-18 ENCOUNTER — Other Ambulatory Visit: Payer: Self-pay | Admitting: Internal Medicine

## 2021-12-19 MED ORDER — AMLODIPINE BESYLATE 2.5 MG PO TABS: 2.5000 mg | ORAL_TABLET | Freq: Every day | ORAL | 0 refills | Status: DC

## 2021-12-29 DIAGNOSIS — L02214 Cutaneous abscess of groin: Secondary | ICD-10-CM | POA: Diagnosis not present

## 2021-12-30 ENCOUNTER — Ambulatory Visit: Payer: Self-pay | Admitting: *Deleted

## 2021-12-30 ENCOUNTER — Encounter: Payer: Self-pay | Admitting: Internal Medicine

## 2021-12-30 ENCOUNTER — Ambulatory Visit (INDEPENDENT_AMBULATORY_CARE_PROVIDER_SITE_OTHER): Payer: Medicare Other | Admitting: Internal Medicine

## 2021-12-30 VITALS — BP 128/70 | HR 62 | Temp 97.5°F | Wt 165.0 lb

## 2021-12-30 DIAGNOSIS — R238 Other skin changes: Secondary | ICD-10-CM

## 2021-12-30 DIAGNOSIS — M7989 Other specified soft tissue disorders: Secondary | ICD-10-CM | POA: Diagnosis not present

## 2021-12-30 DIAGNOSIS — R519 Headache, unspecified: Secondary | ICD-10-CM

## 2021-12-30 NOTE — Patient Instructions (Signed)
General Headache Without Cause A headache is pain or discomfort felt around the head or neck area. There are many causes and types of headaches. A few common types include: Tension headaches. Migraine headaches. Cluster headaches. Chronic daily headaches. Sometimes, the specific cause of a headache may not be found. Follow these instructions at home: Watch your condition for any changes. Let your health care provider know about them. Take these steps to help with your condition: Managing pain     Take over-the-counter and prescription medicines only as told by your health care provider. Treatment may include medicines for pain that are taken by mouth or applied to the skin. Lie down in a dark, quiet room when you have a headache. Keep lights dim if bright lights bother you or make your headaches worse. If directed, put ice on your head and neck area: Put ice in a plastic bag. Place a towel between your skin and the bag. Leave the ice on for 20 minutes, 2-3 times per day. Remove the ice if your skin turns bright red. This is very important. If you cannot feel pain, heat, or cold, you have a greater risk of damage to the area. If directed, apply heat to the affected area. Use the heat source that your health care provider recommends, such as a moist heat pack or a heating pad. Place a towel between your skin and the heat source. Leave the heat on for 20-30 minutes. Remove the heat if your skin turns bright red. This is especially important if you are unable to feel pain, heat, or cold. You have a greater risk of getting burned. Eating and drinking Eat meals on a regular schedule. If you drink alcohol: Limit how much you have to: 0-1 drink a day for women who are not pregnant. 0-2 drinks a day for men. Know how much alcohol is in a drink. In the [place] one drink equals one 12 oz bottle of beer (355 mL), one 5 oz glass of wine (148 mL), or one 1 oz glass of hard liquor (44 mL). Stop  drinking caffeine, or decrease the amount of caffeine you drink. Drink enough fluid to keep your urine pale yellow. General instructions  Keep a headache journal to help find out what may trigger your headaches. For example, write down: What you eat and drink. How much sleep you get. Any change to your diet or medicines. Try massage or other relaxation techniques. Limit stress. Sit up straight, and do not tense your muscles. Do not use any products that contain nicotine or tobacco. These products include cigarettes, chewing tobacco, and vaping devices, such as e-cigarettes. If you need help quitting, ask your health care provider. Exercise regularly as told by your health care provider. Sleep on a regular schedule. Get 7-9 hours of sleep each night, or the amount recommended by your health care provider. Keep all follow-up visits. This is important. Contact a health care provider if: Medicine does not help your symptoms. You have a headache that is different from your usual headache. You have nausea or you vomit. You have a fever. Get help right away if: Your headache: Becomes severe quickly. Gets worse after moderate to intense physical activity. You have any of these symptoms: Repeated vomiting. Pain or stiffness in your neck. Changes to your vision. Pain in an eye or ear. Problems with speech. Muscular weakness or loss of muscle control. Loss of balance or coordination. You feel faint or pass out. You have confusion. You have   a seizure. These symptoms may represent a serious problem that is an emergency. Do not wait to see if the symptoms will go away. Get medical help right away. Call your local emergency services (911 in the [place] Do not drive yourself to the hospital. Summary A headache is pain or discomfort felt around the head or neck area. There are many causes and types of headaches. In some cases, the cause may not be found. Keep a headache journal to help find out  what may trigger your headaches. Watch your condition for any changes. Let your health care provider know about them. Contact a health care provider if you have a headache that is different from the usual headache, or if your symptoms are not helped by medicine. Get help right away if your headache becomes severe, you vomit, you have a loss of vision, you lose your balance, or you have a seizure. This information is not intended to replace advice given to you by your health care provider. Make sure you discuss any questions you have with your health care provider. Document Revised: 10/17/2020 Document Reviewed: 10/17/2020 Elsevier Patient Education  2023 [employer]  

## 2021-12-30 NOTE — Telephone Encounter (Signed)
  Chief Complaint: saw Rene Kocher today Symptoms: forgot to ask for rx renewal, now the red bumps are itching Frequency: most of time Pertinent Negatives: Patient denies any open areas Disposition: [] ED /[] Urgent Care (no appt availability in office) / [] Appointment(In office/virtual)/ []  [facility]/ [] Home Care/ [] Refused Recommended Disposition /[] [facility]/ [x]  Follow-up with PCP Additional Notes: Pt seen by , NP today, forgot to ask if can renew Ketoconazole and also once she got home the red bumps she showed today started itching. Wants to know if she can take Benadryl by mouth or cream.  Reason for Disposition  Mild localized rash from wearing a face mask  Answer Assessment - Initial Assessment Questions 1. DESCRIPTION: "Describe the itching you are having." "Where is it located?"    Red bumps she showed provider today now are itching 2. SEVERITY: "How bad is it?"    - MILD: Doesn't interfere with normal activities.   - MODERATE-SEVERE: Interferes with work, school, sleep, or other activities.      moderate 3. SCRATCHING: "Are there any scratch marks? Bleeding?"     no 4. ONSET: "When did the itching begin?"      today 5. CAUSE: "What do you think is causing the itching?"      Skin on skin for some and other red spots on left upper thigh 6. OTHER SYMPTOMS: "Do you have any other symptoms?"      No, saw provider today 7. PREGNANCY: "Is there any chance you are pregnant?" "When was your last menstrual period?"     no  Protocols used: Itching - Localized-A-AH, Rash or Redness - Localized-A-AH

## 2021-12-30 NOTE — Progress Notes (Signed)
Subjective:    Patient ID: Anita Mathews, female    DOB: 02-Nov-1950, 71 y.o.   MRN: [id number]  HPI  Patient presents to the clinic today with complaint of frequent headache.  This started while she was on Prednisone.  The headache was located in the crown of her head. She describes the pain as pressure. She denies vision changes, dizziness,, sensitivity to light or sound. She only noticed it when she was coughing. She reports the headaches resolved a few day after stopping the Prednisone.   She also reports a rash to her left thigh. She noticed this yesterday. It does not itch or burn. She was seen at [facility] yesterday for the same. She was prescribed Septra DS and is taking the medication as prescribed.   Review of Systems     Past Medical History:  Diagnosis Date   Arthritis    Glaucoma     Current Outpatient Medications  Medication Sig Dispense Refill   alendronate (FOSAMAX) 70 MG tablet Take 1 tablet (70 mg total) by mouth once a week. Take with a full glass of water on an empty stomach. 12 tablet 1   amLODipine (NORVASC) 2.5 MG tablet Take 1 tablet (2.5 mg total) by mouth daily. 90 tablet 0   amoxicillin-clavulanate (AUGMENTIN) 875-125 MG tablet Take 1 tablet by mouth 2 (two) times daily. 20 tablet 0   Biotin 1 MG CAPS Take 1 capsule by mouth daily.     calcium citrate-vitamin D (CITRACAL+D) 315-200 MG-UNIT tablet Take 1 tablet by mouth every morning.     cyanocobalamin 100 MCG tablet      dorzolamide-timolol (COSOPT) 22.3-6.8 MG/ML ophthalmic solution Apply to eye.     ferrous gluconate (FERGON) 324 MG tablet Take 324 mg by mouth every other day.     hydrALAZINE (APRESOLINE) 25 MG tablet TAKE 1 TABLET BY MOUTH THREE TIMES DAILY AS NEEDED FOR SYSTOLIC BLOOD PRESSURE 270 tablet 0   Omega-3 Fatty Acids (FISH OIL) 1000 MG CAPS Take 1 capsule by mouth daily.     OPTIVE 0.5-0.9 % ophthalmic solution SMARTSIG:1 Drop(s) In Eye(s) PRN     predniSONE (DELTASONE) 10 MG tablet Take 6 tabs  on day 1, 5 tabs on day 2, 4 tabs on day 3, 3 tabs on day 4, 2 tabs on day 5, 1 tab on day 6 21 tablet 0   propranolol (INDERAL) 10 MG tablet TAKE 1 TABLET(10 MG) BY MOUTH UP TO THREE TIMES DAILY AS NEEDED FOR FAST HEART RATE 270 tablet 0   rosuvastatin (CRESTOR) 5 MG tablet Take 1 tablet (5 mg total) by mouth daily. 90 tablet 1   Secukinumab (COSENTYX) 150 MG/ML SOSY Inject 150 mg into the skin every 30 (thirty) days.     No current facility-administered medications for this visit.    No Known Allergies  Family History  Problem Relation Age of Onset   Breast cancer Mother 47   Breast cancer Paternal Grandmother     Social History   Socioeconomic History   Marital status: Divorced    Spouse name: Not on file   Number of children: Not on file   Years of education: Not on file   Highest education level: Not on file  Occupational History   Not on file  Tobacco Use   Smoking status: Never   Smokeless tobacco: Never  Vaping Use   Vaping Use: Never used  Substance and Sexual Activity   Alcohol use: Yes    Comment: occasional  Drug use: Never   Sexual activity: Not on file  Other Topics Concern   Not on file  Social History Narrative   Not on file   Social Determinants of Health   Financial Resource Strain: Not on file  Food Insecurity: Not on file  Transportation Needs: Not on file  Physical Activity: Not on file  Stress: Not on file  Social Connections: Not on file  Intimate Partner Violence: Not on file     Constitutional: Patient reports intermittent headache.  Denies fever, malaise, fatigue, or abrupt weight changes.  Skin: Pt reports rash to left thigh. Denies lesion or ulcerations. HEENT: Denies eye pain, eye redness, ear pain, ringing in the ears, wax buildup, runny nose, nasal congestion, bloody nose, or sore throat. Respiratory: Denies difficulty breathing, shortness of breath, cough or sputum production.   Cardiovascular: Denies chest pain, chest  tightness, palpitations or swelling in the hands or feet.  Neurological: Denies dizziness, difficulty with memory, difficulty with speech or problems with balance and coordination.    No other specific complaints in a complete review of systems (except as listed in HPI above).  Objective:   Physical Exam  BP 128/70 (BP Location: Left Arm, Patient Position: Sitting, Cuff Size: Normal)   Pulse 62   Temp (!) 97.5 F (36.4 C) (Temporal)   Wt 165 lb (74.8 kg)   SpO2 100%   BMI 28.32 kg/m   Wt Readings from Last 3 Encounters:  09/25/21 168 lb (76.2 kg)  09/05/21 169 lb (76.7 kg)  08/08/21 169 lb (76.7 kg)    General: Appears her stated age, overweight, in NAD. Skin: 2.5 cm round macular lesion noted to left thigh. HEENT: Head: normal shape and size; Eyes: sclera white, no icterus, conjunctiva pink, PERRLA and EOMs intact;  Cardiovascular: Normal rate and rhythm. S1,S2 noted.  No murmur, rubs or gallops noted.  Pulmonary/Chest: Normal effort and positive vesicular breath sounds. No respiratory distress. No wheezes, rales or ronchi noted.  Neurological: Alert and oriented.  Coordination normal.    BMET    Component Value Date/Time   NA 135 06/06/2021 2244   K 3.8 06/06/2021 2244   CL 104 06/06/2021 2244   CO2 24 06/06/2021 2244   GLUCOSE 101 (H) 06/06/2021 2244   BUN 12 06/06/2021 2244   CREATININE 0.77 06/06/2021 2244   CREATININE 0.90 05/30/2021 1532   CALCIUM 9.1 06/06/2021 2244   GFRNONAA >60 06/06/2021 2244   GFRAA >60 01/24/2018 1056    Lipid Panel     Component Value Date/Time   CHOL 189 03/20/2021 0934   TRIG 70 03/20/2021 0934   HDL 83 03/20/2021 0934   CHOLHDL 2.3 03/20/2021 0934   LDLCALC 90 03/20/2021 0934    CBC    Component Value Date/Time   WBC 6.5 06/06/2021 2244   RBC 4.10 06/06/2021 2244   HGB 12.7 06/06/2021 2244   HCT 37.8 06/06/2021 2244   PLT 286 06/06/2021 2244   MCV 92.2 06/06/2021 2244   MCH 31.0 06/06/2021 2244   MCHC 33.6  06/06/2021 2244   RDW 12.2 06/06/2021 2244   LYMPHSABS 2.2 01/24/2018 1056   MONOABS 0.9 01/24/2018 1056   EOSABS 0.1 01/24/2018 1056   BASOSABS 0.1 01/24/2018 1056    Hgb A1C No results found for: "HGBA1C"         Assessment & Plan:   Headache:  Likely medication induced Resolved Will continue to monitor at this time  [facility] Follow Up for Skin Lesion of Thigh:  [facility] notes reviewed Concerning for MRSA Already on Septra DS- continue as prescribed Notify me if this worsens despite antibiotics  RTC in 2 months for follow-up of chronic conditions Nicki Reaper, NP

## 2021-12-31 MED ORDER — KETOCONAZOLE 2 % EX CREA: 1.0000 | TOPICAL_CREAM | Freq: Every day | CUTANEOUS | 0 refills | Status: AC

## 2021-12-31 NOTE — Addendum Note (Signed)
Addended by: Lorre Munroe on: 12/31/2021 08:23 AM   Modules accepted: Orders

## 2021-12-31 NOTE — Telephone Encounter (Signed)
Left message advising pt.   Thanks,   -Tasha Jindra  

## 2021-12-31 NOTE — Telephone Encounter (Signed)
Ketoconazole cream refilled.  She can take oral Benadryl or Benadryl cream for the itching.

## 2022-01-01 ENCOUNTER — Encounter: Payer: Self-pay | Admitting: Internal Medicine

## 2022-01-31 ENCOUNTER — Encounter: Payer: Self-pay | Admitting: Internal Medicine

## 2022-03-04 ENCOUNTER — Ambulatory Visit: Payer: Medicare Other | Admitting: Internal Medicine

## 2022-03-05 ENCOUNTER — Ambulatory Visit: Payer: Medicare Other | Admitting: Internal Medicine

## 2022-03-05 DIAGNOSIS — Z789 Other specified health status: Secondary | ICD-10-CM | POA: Insufficient documentation

## 2022-03-05 NOTE — Progress Notes (Deleted)
Subjective:    Patient ID: Anita Mathews, female    DOB: 1951-05-09, 71 y.o.   MRN: [id number]  HPI  Patient presents to clinic today for follow-up of chronic conditions.  Psoriatic Arthritis: Managed with Cosentyx.  She follows with rheumatology.  HTN: Her BP today is.  She is taking Amlodipine, Hydralazine and Propanolol as prescribed.  ECG from 07/2021 reviewed.  OA: Mainly in her knees.  She takes Ibuprofen as needed with some relief of symptoms.  She follows with rheumatology.  HLD: Her last LDL was 90, triglycerides 70, 01/2021.  She denies myalgias on Rosuvastatin and Fish Oil.  She tries to consume a low-fat diet.  Osteoporosis: She is taking Alendronate as prescribed.  She tries to get weightbearing exercise daily.  Bone density from 04/2020 reviewed.  Iron Deficiency Anemia: Her last H/H was 12.7/37.8, 06/2021.  She is taking oral Iron as prescribed.  She does not follow with hematology.  Review of Systems     Past Medical History:  Diagnosis Date   Arthritis    Glaucoma     Current Outpatient Medications  Medication Sig Dispense Refill   alendronate (FOSAMAX) 70 MG tablet Take 1 tablet (70 mg total) by mouth once a week. Take with a full glass of water on an empty stomach. 12 tablet 1   amLODipine (NORVASC) 2.5 MG tablet Take 1 tablet (2.5 mg total) by mouth daily. 90 tablet 0   Biotin 1 MG CAPS Take 1 capsule by mouth daily.     calcium citrate-vitamin D (CITRACAL+D) 315-200 MG-UNIT tablet Take 1 tablet by mouth every morning.     cyanocobalamin 100 MCG tablet      dorzolamide-timolol (COSOPT) 22.3-6.8 MG/ML ophthalmic solution Apply to eye.     ferrous gluconate (FERGON) 324 MG tablet Take 324 mg by mouth every other day.     hydrALAZINE (APRESOLINE) 25 MG tablet TAKE 1 TABLET BY MOUTH THREE TIMES DAILY AS NEEDED FOR SYSTOLIC BLOOD PRESSURE 341 tablet 0   ketoconazole (NIZORAL) 2 % cream Apply 1 Application topically daily. 30 g 0   Omega-3 Fatty Acids (FISH  OIL) 1000 MG CAPS Take 1 capsule by mouth daily.     OPTIVE 0.5-0.9 % ophthalmic solution SMARTSIG:1 Drop(s) In Eye(s) PRN     propranolol (INDERAL) 10 MG tablet TAKE 1 TABLET(10 MG) BY MOUTH UP TO THREE TIMES DAILY AS NEEDED FOR FAST HEART RATE 270 tablet 0   rosuvastatin (CRESTOR) 5 MG tablet Take 1 tablet (5 mg total) by mouth daily. 90 tablet 1   Secukinumab (COSENTYX) 150 MG/ML SOSY Inject 150 mg into the skin every 30 (thirty) days.     sulfamethoxazole-trimethoprim (BACTRIM DS) 800-160 MG tablet Take 1 tablet by mouth 2 (two) times daily.     No current facility-administered medications for this visit.    No Known Allergies  Family History  Problem Relation Age of Onset   Breast cancer Mother 46   Breast cancer Paternal Grandmother     Social History   Socioeconomic History   Marital status: Divorced    Spouse name: Not on file   Number of children: Not on file   Years of education: Not on file   Highest education level: Not on file  Occupational History   Not on file  Tobacco Use   Smoking status: Never   Smokeless tobacco: Never  Vaping Use   Vaping Use: Never used  Substance and Sexual Activity   Alcohol use: Yes  Comment: occasional   Drug use: Never   Sexual activity: Not on file  Other Topics Concern   Not on file  Social History Narrative   Not on file   Social Determinants of Health   Financial Resource Strain: Not on file  Food Insecurity: Not on file  Transportation Needs: Not on file  Physical Activity: Not on file  Stress: Not on file  Social Connections: Not on file  Intimate Partner Violence: Not on file     Constitutional: Denies fever, malaise, fatigue, headache or abrupt weight changes.  HEENT: Denies eye pain, eye redness, ear pain, ringing in the ears, wax buildup, runny nose, nasal congestion, bloody nose, or sore throat. Respiratory: Denies difficulty breathing, shortness of breath, cough or sputum production.   Cardiovascular:  Denies chest pain, chest tightness, palpitations or swelling in the hands or feet.  Gastrointestinal: Denies abdominal pain, bloating, constipation, diarrhea or blood in the stool.  GU: Denies urgency, frequency, pain with urination, burning sensation, blood in urine, odor or discharge. Musculoskeletal: Patient reports joint pain.  Denies decrease in range of motion, difficulty with gait, muscle pain or joint swelling.  Skin: Denies redness, rashes, lesions or ulcercations.  Neurological: Denies dizziness, difficulty with memory, difficulty with speech or problems with balance and coordination.  Psych: Denies anxiety, depression, SI/HI.  No other specific complaints in a complete review of systems (except as listed in HPI above).  Objective:   Physical Exam  There were no vitals taken for this visit. Wt Readings from Last 3 Encounters:  12/30/21 165 lb (74.8 kg)  09/25/21 168 lb (76.2 kg)  09/05/21 169 lb (76.7 kg)    General: Appears their stated age, well developed, well nourished in NAD. Skin: Warm, dry and intact. No rashes, lesions or ulcerations noted. HEENT: Head: normal shape and size; Eyes: sclera white, no icterus, conjunctiva pink, PERRLA and EOMs intact; Ears: Tm's gray and intact, normal light reflex; Nose: mucosa pink and moist, septum midline; Throat/Mouth: Teeth present, mucosa pink and moist, no exudate, lesions or ulcerations noted.  Neck:  Neck supple, trachea midline. No masses, lumps or thyromegaly present.  Cardiovascular: Normal rate and rhythm. S1,S2 noted.  No murmur, rubs or gallops noted. No JVD or BLE edema. No carotid bruits noted. Pulmonary/Chest: Normal effort and positive vesicular breath sounds. No respiratory distress. No wheezes, rales or ronchi noted.  Abdomen: Soft and nontender. Normal bowel sounds. No distention or masses noted. Liver, spleen and kidneys non palpable. Musculoskeletal: Normal range of motion. No signs of joint swelling. No difficulty  with gait.  Neurological: Alert and oriented. Cranial nerves II-XII grossly intact. Coordination normal.  Psychiatric: Mood and affect normal. Behavior is normal. Judgment and thought content normal.    BMET    Component Value Date/Time   NA 135 06/06/2021 2244   K 3.8 06/06/2021 2244   CL 104 06/06/2021 2244   CO2 24 06/06/2021 2244   GLUCOSE 101 (H) 06/06/2021 2244   BUN 12 06/06/2021 2244   CREATININE 0.77 06/06/2021 2244   CREATININE 0.90 05/30/2021 1532   CALCIUM 9.1 06/06/2021 2244   GFRNONAA >60 06/06/2021 2244   GFRAA >60 01/24/2018 1056    Lipid Panel     Component Value Date/Time   CHOL 189 03/20/2021 0934   TRIG 70 03/20/2021 0934   HDL 83 03/20/2021 0934   CHOLHDL 2.3 03/20/2021 0934   LDLCALC 90 03/20/2021 0934    CBC    Component Value Date/Time   WBC 6.5  06/06/2021 2244   RBC 4.10 06/06/2021 2244   HGB 12.7 06/06/2021 2244   HCT 37.8 06/06/2021 2244   PLT 286 06/06/2021 2244   MCV 92.2 06/06/2021 2244   MCH 31.0 06/06/2021 2244   MCHC 33.6 06/06/2021 2244   RDW 12.2 06/06/2021 2244   LYMPHSABS 2.2 01/24/2018 1056   MONOABS 0.9 01/24/2018 1056   EOSABS 0.1 01/24/2018 1056   BASOSABS 0.1 01/24/2018 1056    Hgb A1C No results found for: "HGBA1C"          Assessment & Plan:     RTC in 6 months for your annual exam Nicki Reaper, NP

## 2022-03-10 DIAGNOSIS — M810 Age-related osteoporosis without current pathological fracture: Secondary | ICD-10-CM | POA: Diagnosis not present

## 2022-03-10 LAB — HM DEXA SCAN

## 2022-03-11 DIAGNOSIS — H40053 Ocular hypertension, bilateral: Secondary | ICD-10-CM | POA: Diagnosis not present

## 2022-03-20 ENCOUNTER — Encounter: Payer: Self-pay | Admitting: Internal Medicine

## 2022-03-20 ENCOUNTER — Telehealth: Payer: Self-pay | Admitting: Internal Medicine

## 2022-03-20 ENCOUNTER — Ambulatory Visit (INDEPENDENT_AMBULATORY_CARE_PROVIDER_SITE_OTHER): Payer: Medicare Other | Admitting: Internal Medicine

## 2022-03-20 VITALS — BP 130/62 | HR 82 | Temp 96.9°F | Wt 164.0 lb

## 2022-03-20 DIAGNOSIS — M7918 Myalgia, other site: Secondary | ICD-10-CM

## 2022-03-20 DIAGNOSIS — J069 Acute upper respiratory infection, unspecified: Secondary | ICD-10-CM

## 2022-03-20 DIAGNOSIS — E782 Mixed hyperlipidemia: Secondary | ICD-10-CM

## 2022-03-20 DIAGNOSIS — M810 Age-related osteoporosis without current pathological fracture: Secondary | ICD-10-CM

## 2022-03-20 DIAGNOSIS — L4050 Arthropathic psoriasis, unspecified: Secondary | ICD-10-CM

## 2022-03-20 DIAGNOSIS — E663 Overweight: Secondary | ICD-10-CM

## 2022-03-20 DIAGNOSIS — M159 Polyosteoarthritis, unspecified: Secondary | ICD-10-CM

## 2022-03-20 DIAGNOSIS — I10 Essential (primary) hypertension: Secondary | ICD-10-CM

## 2022-03-20 MED ORDER — BENZONATATE 200 MG PO CAPS: 200.0000 mg | ORAL_CAPSULE | Freq: Three times a day (TID) | ORAL | 0 refills | Status: DC | PRN

## 2022-03-20 MED ORDER — PREDNISONE 10 MG PO TABS: ORAL_TABLET | ORAL | 0 refills | Status: DC

## 2022-03-20 MED ORDER — AMLODIPINE BESYLATE 2.5 MG PO TABS: 2.5000 mg | ORAL_TABLET | Freq: Every day | ORAL | 0 refills | Status: DC

## 2022-03-20 NOTE — Assessment & Plan Note (Signed)
Continue Cosentyx She will continue to follow with rheumatology

## 2022-03-20 NOTE — Assessment & Plan Note (Signed)
Encourage weight loss as this can help reduce joint pain 

## 2022-03-20 NOTE — Assessment & Plan Note (Signed)
Continue amlodipine, refilled today She has propanolol and hydralazine to take if needed Reinforced DASHdiet and exercise for weight loss C-Met today

## 2022-03-20 NOTE — Assessment & Plan Note (Signed)
Continue alendronate Urged weightbearing exercise daily

## 2022-03-20 NOTE — Patient Instructions (Signed)
Piriformis Syndrome  Piriformis syndrome is a condition that can cause pain and numbness in your buttocks and down the back of your leg. Piriformis syndrome happens when the small muscle that connects the base of your spine to your hip (piriformis muscle) presses on the nerve that runs down the back of your leg (sciatic nerve). The piriformis muscle helps your hip rotate and helps to bring your leg back and out. It also helps shift your weight to keep you stable while you are walking. The sciatic nerve runs under or through the piriformis muscle. Damage to the piriformis muscle can cause spasms that put pressure on the nerve below. This causes pain and discomfort while sitting and moving. The pain may feel as if it begins in the buttock and spreads (radiates) down your hip and thigh. What are the causes? This condition is caused by pressure on the sciatic nerve from the piriformis muscle. The piriformis muscle can get irritated with overuse, especially if other hip muscles are weak and the piriformis muscle has to do extra work. Piriformis syndrome can also occur after an injury, like a fall onto your buttocks. What increases the risk? You are more likely to develop this condition if you: Are a woman. Sit for long periods of time. Are a cyclist. Have weak buttocks muscles (gluteal muscles). What are the signs or symptoms? Symptoms of this condition include: Pain, tingling, or numbness that starts in the buttock and runs down the back of your leg (sciatica). Pain in the groin or thigh area. Your symptoms may get worse: The longer you sit. When you walk, run, or climb stairs. When straining to have a bowel movement. How is this diagnosed? This condition is diagnosed based on your symptoms, medical history, and physical exam. During the exam, your health care provider may: Move your leg into different positions to check for pain. Press on the muscles of your hip and buttock to see if that  increases your symptoms. You may also have tests, including: Imaging tests such as X-rays, CT, MRI, or ultrasound. Electromyogram (EMG). This test measures electrical signals sent by your nerves into the muscles. Nerve conduction study. This test measures how well electrical signals pass through your nerves. How is this treated? This condition may be treated by: Stopping all activities that cause pain or make your condition worse. Applying ice or using heat therapy. Taking medicines to reduce pain and swelling. Taking a muscle relaxer (muscle relaxant) to stop muscle spasms. Doing range-of-motion and strengthening exercises (physical therapy) as told by your health care provider. Having massage, acupuncture, or local electrical stimulation (transcutaneous electrical nerve stimulation, TENS). Getting an injection of medicine in the piriformis muscle. Your health care provider will choose the medicine based on your condition. He or she may inject: An anti-inflammatory medicine (steroid) to reduce swelling. A numbing medicine (local anesthetic) to block the pain. Botulinum toxin. The toxin blocks nerve impulses to specific muscles to reduce muscle tension. In rare cases, you may need surgery to cut the muscle and release pressure on the nerve if other treatments do not work. Follow these instructions at home: Activity Do not sit for long periods. Get up and walk around every 20 minutes or as often as told by your health care provider. When driving long distances, make sure to take frequent stops to get up and stretch. Use a cushion when you sit on hard surfaces. Do exercises as told by your health care provider. Return to your normal activities as   told by your health care provider. Ask your health care provider what activities are safe for you. Managing pain, stiffness, and swelling     If directed, apply heat to the area as often as told by your health care provider. Use the heat source  that your health care provider recommends, such as a moist heat pack or a heating pad. Place a towel between your skin and the heat source. Leave the heat on for 20-30 minutes. Remove the heat if your skin turns bright red. This is especially important if you are unable to feel pain, heat, or cold. You have a greater risk of getting burned. If directed, put ice on the injured area. To do this: Put ice in a plastic bag. Place a towel between your skin and the bag. Leave the ice on for 20 minutes, 2-3 times a day. Remove the ice if your skin turns bright red. This is very important. If you cannot feel pain, heat, or cold, you have a greater risk of damage to the area. General instructions Take over-the-counter and prescription medicines only as told by your health care provider. Ask your health care provider if the medicine prescribed to you requires you to avoid driving or using machinery. You may need to take these actions to prevent or treat constipation: Drink enough fluid to keep your urine pale yellow. Take over-the-counter or prescription medicines. Eat foods that are high in fiber, such as beans, whole grains, and fresh fruits and vegetables. Limit foods that are high in fat and processed sugars, such as fried or sweet foods. Keep all follow-up visits. This is important. How is this prevented? Do not sit for longer than 20 minutes at a time. When you sit, choose padded surfaces. Warm up and stretch before being active. Cool down and stretch after being active. Contact a health care provider if: Your pain and stiffness continue or get worse. Your leg or hip becomes weak. You have changes in your bowel function or bladder function. Summary Piriformis syndrome is a condition that can cause pain, tingling, and numbness in your buttocks and down the back of your leg. You may try applying heat or ice to relieve the pain. Do not sit for long periods. Get up and walk around every 20  minutes or as often as told by your health care provider. This information is not intended to replace advice given to you by your health care provider. Make sure you discuss any questions you have with your health care provider. Document Revised: 11/12/2020 Document Reviewed: 11/12/2020 Elsevier Patient Education  2023 [employer]  

## 2022-03-20 NOTE — Assessment & Plan Note (Signed)
C-Met and lipid profile today Encouraged her to consume low-fat diet Continue rosuvastatin 

## 2022-03-20 NOTE — Assessment & Plan Note (Signed)
Encourage diet and exercise for weight loss 

## 2022-03-20 NOTE — Telephone Encounter (Signed)
Pt stated her medication benzonatate (TESSALON) 200 MG capsule is not covered by her insurance its $47.00 and she cannot afford it. Pt is requesting an alternative.   Please advise.

## 2022-03-20 NOTE — Progress Notes (Signed)
Subjective:    Patient ID: Anita Mathews, female    DOB: 1951-03-04, 71 y.o.   MRN: [id number]  HPI  Patient presents to clinic today for follow-up of chronic conditions.  HTN: Her BP today is 130/62.  She is taking Amlodipine as prescribed, Hydralazine and Propanolol as needed.  ECG from 08/2021 reviewed.  HLD: Her last LDL was 90, triglycerides 70, 03/2021.  She denies myalgias on Rosuvastatin.  She tries to consume low-fat diet.  Psoriatic Arthritis/OA: Managed with Cosentyx.  She follows with rheumatology.  Osteoporosis: She is taking Alendronate as prescribed.  She tries to get weightbearing exercise daily.  Bone density from 04/2019 reviewed.  She also reports hoarseness and cough. She reports this started 9 days ago after getting her RSV vaccine. The cough is productive of mucous. She denies headache, runny nose, nasal congestion, ear pain, sore throat, or shortness of breath. She denies fever, chills or body aches. She has tried Mucinex with minimal relief of symptoms. She has had a negative covid test at home.  She also reports a tender spot in her right buttocks. She noticed this 1-2 weeks ago. She decribes the pain as burning. The pain is worse with sitting and laying down. She denies numbness, tingling or weakness in her right lower extremity. She has tried A&D ointment with minimal relief of symptoms.   Review of Systems     Past Medical History:  Diagnosis Date   Arthritis    Glaucoma     Current Outpatient Medications  Medication Sig Dispense Refill   alendronate (FOSAMAX) 70 MG tablet Take 1 tablet (70 mg total) by mouth once a week. Take with a full glass of water on an empty stomach. 12 tablet 1   amLODipine (NORVASC) 2.5 MG tablet Take 1 tablet (2.5 mg total) by mouth daily. 90 tablet 0   Biotin 1 MG CAPS Take 1 capsule by mouth daily.     calcium citrate-vitamin D (CITRACAL+D) 315-200 MG-UNIT tablet Take 1 tablet by mouth every morning.     cyanocobalamin  100 MCG tablet      dorzolamide-timolol (COSOPT) 22.3-6.8 MG/ML ophthalmic solution Apply to eye.     ferrous gluconate (FERGON) 324 MG tablet Take 324 mg by mouth every other day.     hydrALAZINE (APRESOLINE) 25 MG tablet TAKE 1 TABLET BY MOUTH THREE TIMES DAILY AS NEEDED FOR SYSTOLIC BLOOD PRESSURE 270 tablet 0   ketoconazole (NIZORAL) 2 % cream Apply 1 Application topically daily. 30 g 0   Omega-3 Fatty Acids (FISH OIL) 1000 MG CAPS Take 1 capsule by mouth daily.     OPTIVE 0.5-0.9 % ophthalmic solution SMARTSIG:1 Drop(s) In Eye(s) PRN     propranolol (INDERAL) 10 MG tablet TAKE 1 TABLET(10 MG) BY MOUTH UP TO THREE TIMES DAILY AS NEEDED FOR FAST HEART RATE 270 tablet 0   rosuvastatin (CRESTOR) 5 MG tablet Take 1 tablet (5 mg total) by mouth daily. 90 tablet 1   Secukinumab (COSENTYX) 150 MG/ML SOSY Inject 150 mg into the skin every 30 (thirty) days.     sulfamethoxazole-trimethoprim (BACTRIM DS) 800-160 MG tablet Take 1 tablet by mouth 2 (two) times daily.     No current facility-administered medications for this visit.    No Known Allergies  Family History  Problem Relation Age of Onset   Breast cancer Mother 16   Breast cancer Paternal Grandmother     Social History   Socioeconomic History   Marital status: Divorced  Spouse name: Not on file   Number of children: Not on file   Years of education: Not on file   Highest education level: Not on file  Occupational History   Not on file  Tobacco Use   Smoking status: Never   Smokeless tobacco: Never  Vaping Use   Vaping Use: Never used  Substance and Sexual Activity   Alcohol use: Yes    Comment: occasional   Drug use: Never   Sexual activity: Not on file  Other Topics Concern   Not on file  Social History Narrative   Not on file   Social Determinants of Health   Financial Resource Strain: Not on file  Food Insecurity: Not on file  Transportation Needs: Not on file  Physical Activity: Not on file  Stress: Not  on file  Social Connections: Not on file  Intimate Partner Violence: Not on file     Constitutional: Denies fever, malaise, fatigue, headache or abrupt weight changes.  HEENT: Patient reports hoarseness.  Denies eye pain, eye redness, ear pain, ringing in the ears, wax buildup, runny nose, nasal congestion, bloody nose, or sore throat. Respiratory: Reports cough.  Denies difficulty breathing, shortness of breath.   Cardiovascular: Denies chest pain, chest tightness, palpitations or swelling in the hands or feet.  Gastrointestinal: Denies abdominal pain, bloating, constipation, diarrhea or blood in the stool.  GU: Denies urgency, frequency, pain with urination, burning sensation, blood in urine, odor or discharge. Musculoskeletal: Patient reports pain in left buttock.  Denies decrease in range of motion, difficulty with gait, muscle pain or joint swelling.  Skin: Denies redness, rashes, lesions or ulcercations.  Neurological: Denies dizziness, difficulty with memory, difficulty with speech or problems with balance and coordination.  Psych: Denies anxiety, depression, SI/HI.  No other specific complaints in a complete review of systems (except as listed in HPI above).  Objective:    BP 130/62 (BP Location: Right Arm, Patient Position: Sitting, Cuff Size: Normal)   Pulse 82   Temp (!) 96.9 F (36.1 C) (Temporal)   Wt 164 lb (74.4 kg)   SpO2 99%   BMI 28.15 kg/m   Wt Readings from Last 3 Encounters:  12/30/21 165 lb (74.8 kg)  09/25/21 168 lb (76.2 kg)  09/05/21 169 lb (76.7 kg)    General: Appears her stated age, overweight, in NAD. Skin: Warm, dry and intact. No rashes noted. HEENT: Head: normal shape and size; Eyes: sclera white, no icterus, conjunctiva pink, PERRLA and EOMs intact;  Throat/Mouth: Teeth present, mucosa pink and moist, no exudate, lesions or ulcerations noted.  Neck: No adenopathy noted. Cardiovascular: Normal rate and rhythm. S1,S2 noted.  No murmur, rubs or  gallops noted. No JVD or BLE edema. No carotid bruits noted. Pulmonary/Chest: Normal effort and positive vesicular breath sounds. No respiratory distress. No wheezes, rales or ronchi noted.  Musculoskeletal: No pain with palpation over the ischial tuberosity of the right buttock.  Strength 5/5 BLE.  No difficulty with gait.  Neurological: Alert and oriented. Cranial nerves II-XII grossly intact. Coordination normal.    BMET    Component Value Date/Time   NA 135 06/06/2021 2244   K 3.8 06/06/2021 2244   CL 104 06/06/2021 2244   CO2 24 06/06/2021 2244   GLUCOSE 101 (H) 06/06/2021 2244   BUN 12 06/06/2021 2244   CREATININE 0.77 06/06/2021 2244   CREATININE 0.90 05/30/2021 1532   CALCIUM 9.1 06/06/2021 2244   GFRNONAA >60 06/06/2021 2244   GFRAA >60 01/24/2018  1056    Lipid Panel     Component Value Date/Time   CHOL 189 03/20/2021 0934   TRIG 70 03/20/2021 0934   HDL 83 03/20/2021 0934   CHOLHDL 2.3 03/20/2021 0934   LDLCALC 90 03/20/2021 0934    CBC    Component Value Date/Time   WBC 6.5 06/06/2021 2244   RBC 4.10 06/06/2021 2244   HGB 12.7 06/06/2021 2244   HCT 37.8 06/06/2021 2244   PLT 286 06/06/2021 2244   MCV 92.2 06/06/2021 2244   MCH 31.0 06/06/2021 2244   MCHC 33.6 06/06/2021 2244   RDW 12.2 06/06/2021 2244   LYMPHSABS 2.2 01/24/2018 1056   MONOABS 0.9 01/24/2018 1056   EOSABS 0.1 01/24/2018 1056   BASOSABS 0.1 01/24/2018 1056    Hgb A1C No results found for: "HGBA1C"          Assessment & Plan:   Pain of Right Buttock:  Could be pressure from prolonged sitting versus piriformis syndrome Encouraged her to change positions frequently or stand when able Rx for Pred taper x6 days  URI:  Likely viral No evidence of infection Rx for Pred taper x6 days Rx for Tessalon 200 mg 3 times daily as needed   RTC in 6 months, follow-up chronic conditions Nicki Reaper, NP

## 2022-03-21 LAB — COMPLETE METABOLIC PANEL WITH GFR
AG Ratio: 1.6 (calc) (ref 1.0–2.5)
ALT: 20 U/L (ref 6–29)
AST: 20 U/L (ref 10–35)
Albumin: 4.7 g/dL (ref 3.6–5.1)
Alkaline phosphatase (APISO): 101 U/L (ref 37–153)
BUN: 14 mg/dL (ref 7–25)
CO2: 27 mmol/L (ref 20–32)
Calcium: 10 mg/dL (ref 8.6–10.4)
Chloride: 104 mmol/L (ref 98–110)
Creat: 0.86 mg/dL (ref 0.60–1.00)
Globulin: 2.9 g/dL (calc) (ref 1.9–3.7)
Glucose, Bld: 119 mg/dL (ref 65–139)
Potassium: 4 mmol/L (ref 3.5–5.3)
Sodium: 140 mmol/L (ref 135–146)
Total Bilirubin: 0.4 mg/dL (ref 0.2–1.2)
Total Protein: 7.6 g/dL (ref 6.1–8.1)
eGFR: 72 mL/min/{1.73_m2} (ref >=60)

## 2022-03-21 LAB — LIPID PANEL
Cholesterol: 170 mg/dL (ref <200)
HDL: 72 mg/dL (ref >=50)
LDL Cholesterol (Calc): 75 mg/dL (calc)
Non-HDL Cholesterol (Calc): 98 mg/dL (calc) (ref <130)
Total CHOL/HDL Ratio: 2.4 (calc) (ref <5.0)
Triglycerides: 142 mg/dL (ref <150)

## 2022-03-21 LAB — CBC
HCT: 38.6 % (ref 35.0–45.0)
Hemoglobin: 13.1 g/dL (ref 11.7–15.5)
MCH: 30.2 pg (ref 27.0–33.0)
MCHC: 33.9 g/dL (ref 32.0–36.0)
MCV: 88.9 fL (ref 80.0–100.0)
MPV: 10.7 fL (ref 7.5–12.5)
Platelets: 321 10*3/uL (ref 140–400)
RBC: 4.34 10*6/uL (ref 3.80–5.10)
RDW: 12.4 % (ref 11.0–15.0)
WBC: 6.8 10*3/uL (ref 3.8–10.8)

## 2022-03-21 NOTE — Telephone Encounter (Signed)
There are no alternative cough tablets however I could send her in some cough syrup.  She could also try NyQuil or Delsym OTC.  Please let me know what she would like to do

## 2022-03-24 ENCOUNTER — Encounter: Payer: Self-pay | Admitting: Internal Medicine

## 2022-03-25 ENCOUNTER — Encounter: Payer: Self-pay | Admitting: Internal Medicine

## 2022-03-25 ENCOUNTER — Ambulatory Visit (INDEPENDENT_AMBULATORY_CARE_PROVIDER_SITE_OTHER): Payer: Medicare Other | Admitting: Internal Medicine

## 2022-03-25 VITALS — BP 138/76 | HR 57 | Temp 97.1°F | Wt 165.0 lb

## 2022-03-25 DIAGNOSIS — J00 Acute nasopharyngitis [common cold]: Secondary | ICD-10-CM | POA: Diagnosis not present

## 2022-03-25 MED ORDER — ALBUTEROL SULFATE HFA 108 (90 BASE) MCG/ACT IN AERS: 2.0000 | INHALATION_SPRAY | Freq: Four times a day (QID) | RESPIRATORY_TRACT | 0 refills | Status: DC | PRN

## 2022-03-25 MED ORDER — AZITHROMYCIN 250 MG PO TABS: ORAL_TABLET | ORAL | 0 refills | Status: DC

## 2022-03-25 MED ORDER — PROMETHAZINE-DM 6.25-15 MG/5ML PO SYRP: 5.0000 mL | ORAL_SOLUTION | Freq: Four times a day (QID) | ORAL | 0 refills | Status: DC | PRN

## 2022-03-25 NOTE — Patient Instructions (Signed)

## 2022-03-25 NOTE — Progress Notes (Signed)
Subjective:    Patient ID: Anita Mathews, female    DOB: 1950/09/25, 71 y.o.   MRN: [id number]  HPI  Patient presents to the clinic today with complaint of runny nose, scratchy throat, persistent cough and right eye pain.  She was seen 10/19 for the same, treated with Prednisone  with minimal relief of symptoms. She has tried Mucinex OTC with minimal relief of symptoms. She denies headache, nasal congestion, ear pain, shortness of breath. She denies fever, chills or body aches.  Review of Systems     Past Medical History:  Diagnosis Date   Arthritis    Glaucoma     Current Outpatient Medications  Medication Sig Dispense Refill   alendronate (FOSAMAX) 70 MG tablet Take 1 tablet (70 mg total) by mouth once a week. Take with a full glass of water on an empty stomach. 12 tablet 1   amLODipine (NORVASC) 2.5 MG tablet Take 1 tablet (2.5 mg total) by mouth daily. 90 tablet 0   benzonatate (TESSALON) 200 MG capsule Take 1 capsule (200 mg total) by mouth 3 (three) times daily as needed for cough. 30 capsule 0   Biotin 1 MG CAPS Take 1 capsule by mouth daily.     calcium citrate-vitamin D (CITRACAL+D) 315-200 MG-UNIT tablet Take 1 tablet by mouth every morning.     cyanocobalamin 100 MCG tablet      dorzolamide-timolol (COSOPT) 22.3-6.8 MG/ML ophthalmic solution Apply to eye.     ferrous gluconate (FERGON) 324 MG tablet Take 324 mg by mouth every other day.     hydrALAZINE (APRESOLINE) 25 MG tablet TAKE 1 TABLET BY MOUTH THREE TIMES DAILY AS NEEDED FOR SYSTOLIC BLOOD PRESSURE 740 tablet 0   ketoconazole (NIZORAL) 2 % cream Apply 1 Application topically daily. 30 g 0   Omega-3 Fatty Acids (FISH OIL) 1000 MG CAPS Take 1 capsule by mouth daily.     OPTIVE 0.5-0.9 % ophthalmic solution SMARTSIG:1 Drop(s) In Eye(s) PRN     predniSONE (DELTASONE) 10 MG tablet Take 6 tabs on day 1, 5 tabs on day 2, 4 tabs on day 3, 3 tabs on day 4, 2 tabs on day 5, 1 tab on day 6 21 tablet 0   propranolol  (INDERAL) 10 MG tablet TAKE 1 TABLET(10 MG) BY MOUTH UP TO THREE TIMES DAILY AS NEEDED FOR FAST HEART RATE 270 tablet 0   rosuvastatin (CRESTOR) 5 MG tablet Take 1 tablet (5 mg total) by mouth daily. 90 tablet 1   Secukinumab (COSENTYX) 150 MG/ML SOSY Inject 150 mg into the skin every 30 (thirty) days.     No current facility-administered medications for this visit.    No Known Allergies  Family History  Problem Relation Age of Onset   Breast cancer Mother 59   Breast cancer Paternal Grandmother     Social History   Socioeconomic History   Marital status: Divorced    Spouse name: Not on file   Number of children: Not on file   Years of education: Not on file   Highest education level: Not on file  Occupational History   Not on file  Tobacco Use   Smoking status: Never   Smokeless tobacco: Never  Vaping Use   Vaping Use: Never used  Substance and Sexual Activity   Alcohol use: Yes    Comment: occasional   Drug use: Never   Sexual activity: Not on file  Other Topics Concern   Not on file  Social History  Narrative   Not on file   Social Determinants of Health   Financial Resource Strain: Not on file  Food Insecurity: Not on file  Transportation Needs: Not on file  Physical Activity: Not on file  Stress: Not on file  Social Connections: Not on file  Intimate Partner Violence: Not on file     Constitutional: Denies fever, malaise, fatigue, headache or abrupt weight changes.  HEENT: Patient reports runny nose and scratchy throat.  Denies eye pain, eye redness, ear pain, ringing in the ears, wax buildup, nasal congestion, bloody nose. Respiratory: Patient reports cough.  Denies difficulty breathing, shortness of breath.   Cardiovascular: Denies chest pain, chest tightness, palpitations or swelling in the hands or feet.  Gastrointestinal: Denies abdominal pain, bloating, constipation, diarrhea or blood in the stool.   No other specific complaints in a complete review  of systems (except as listed in HPI above).  Objective:   Physical Exam BP 138/76 (BP Location: Left Arm, Patient Position: Sitting, Cuff Size: Normal)   Pulse (!) 57   Temp (!) 97.1 F (36.2 C) (Temporal)   Wt 165 lb (74.8 kg)   SpO2 100%   BMI 28.32 kg/m   Wt Readings from Last 3 Encounters:  03/20/22 164 lb (74.4 kg)  12/30/21 165 lb (74.8 kg)  09/25/21 168 lb (76.2 kg)    General: Appears her stated age, appears unwell but in NAD. Skin: Warm, dry and intact. HEENT: Head: normal shape and size, no sinus tenderness noted; Eyes: sclera white, no icterus, conjunctiva pink, PERRLA and EOMs intact; Throat/Mouth: Teeth present, mucosa pink and moist, + PND, no exudate, lesions or ulcerations noted.  Neck: No adenopathy noted. Cardiovascular: Bradycardic with normal rhythm.  Pulmonary/Chest: Normal effort and positive vesicular breath sounds. No respiratory distress. No wheezes, rales or ronchi noted.  Neurological: Alert and oriented.  Psychiatric: Mood and affect normal. Behavior is normal. Judgment and thought content normal.    BMET    Component Value Date/Time   NA 140 03/20/2022 1343   K 4.0 03/20/2022 1343   CL 104 03/20/2022 1343   CO2 27 03/20/2022 1343   GLUCOSE 119 03/20/2022 1343   BUN 14 03/20/2022 1343   CREATININE 0.86 03/20/2022 1343   CALCIUM 10.0 03/20/2022 1343   GFRNONAA >60 06/06/2021 2244   GFRAA >60 01/24/2018 1056    Lipid Panel     Component Value Date/Time   CHOL 170 03/20/2022 1343   TRIG 142 03/20/2022 1343   HDL 72 03/20/2022 1343   CHOLHDL 2.4 03/20/2022 1343   LDLCALC 75 03/20/2022 1343    CBC    Component Value Date/Time   WBC 6.8 03/20/2022 1343   RBC 4.34 03/20/2022 1343   HGB 13.1 03/20/2022 1343   HCT 38.6 03/20/2022 1343   PLT 321 03/20/2022 1343   MCV 88.9 03/20/2022 1343   MCH 30.2 03/20/2022 1343   MCHC 33.9 03/20/2022 1343   RDW 12.4 03/20/2022 1343   LYMPHSABS 2.2 01/24/2018 1056   MONOABS 0.9 01/24/2018 1056    EOSABS 0.1 01/24/2018 1056   BASOSABS 0.1 01/24/2018 1056    Hgb A1C No results found for: "HGBA1C"          Assessment & Plan:   Upper Respiratory Infection:  Encourage rest and fluids Rx for azithromycin 250 milligrams daily for 5 days RX for Promethazine DM cough syrup-sedation caution given  RTC in 6 months for your annual exam Nicki Reaper, NP

## 2022-03-26 ENCOUNTER — Telehealth: Payer: Self-pay | Admitting: Internal Medicine

## 2022-03-26 NOTE — Telephone Encounter (Signed)
Left message for patient to call back and schedule the Medicare Annual Wellness Visit (AWV) virtually or by telephone.  Last AWV 03/20/21  Please schedule at anytime with [place].

## 2022-03-26 NOTE — Telephone Encounter (Signed)
Pt is returning missed call to schedule AWV.  Pt is requesting a call back.

## 2022-03-27 ENCOUNTER — Ambulatory Visit: Payer: Medicare Other | Admitting: Internal Medicine

## 2022-03-27 DIAGNOSIS — M816 Localized osteoporosis [Lequesne]: Secondary | ICD-10-CM | POA: Diagnosis not present

## 2022-03-31 ENCOUNTER — Encounter (INDEPENDENT_AMBULATORY_CARE_PROVIDER_SITE_OTHER): Payer: Self-pay

## 2022-04-03 DIAGNOSIS — H409 Unspecified glaucoma: Secondary | ICD-10-CM | POA: Diagnosis not present

## 2022-04-03 DIAGNOSIS — M1990 Unspecified osteoarthritis, unspecified site: Secondary | ICD-10-CM | POA: Diagnosis not present

## 2022-04-03 DIAGNOSIS — L409 Psoriasis, unspecified: Secondary | ICD-10-CM | POA: Insufficient documentation

## 2022-04-03 DIAGNOSIS — I10 Essential (primary) hypertension: Secondary | ICD-10-CM | POA: Diagnosis not present

## 2022-04-03 DIAGNOSIS — B977 Papillomavirus as the cause of diseases classified elsewhere: Secondary | ICD-10-CM | POA: Insufficient documentation

## 2022-04-03 DIAGNOSIS — E785 Hyperlipidemia, unspecified: Secondary | ICD-10-CM | POA: Diagnosis not present

## 2022-04-03 DIAGNOSIS — Z7960 Long term (current) use of unspecified immunomodulators and immunosuppressants: Secondary | ICD-10-CM | POA: Diagnosis not present

## 2022-04-03 DIAGNOSIS — M810 Age-related osteoporosis without current pathological fracture: Secondary | ICD-10-CM | POA: Diagnosis not present

## 2022-04-03 DIAGNOSIS — L4050 Arthropathic psoriasis, unspecified: Secondary | ICD-10-CM | POA: Diagnosis not present

## 2022-04-03 DIAGNOSIS — M159 Polyosteoarthritis, unspecified: Secondary | ICD-10-CM | POA: Diagnosis not present

## 2022-04-04 ENCOUNTER — Ambulatory Visit (INDEPENDENT_AMBULATORY_CARE_PROVIDER_SITE_OTHER): Payer: Medicare Other

## 2022-04-04 VITALS — BP 130/64 | Ht 63.75 in | Wt 165.2 lb

## 2022-04-04 DIAGNOSIS — Z0000 Encounter for general adult medical examination without abnormal findings: Secondary | ICD-10-CM | POA: Diagnosis not present

## 2022-04-04 NOTE — Progress Notes (Signed)
Subjective:   Anita Mathews is a 71 y.o. female who presents for Medicare Annual (Subsequent) preventive examination.  Review of Systems     Cardiac Risk Factors include: advanced age (>6men, >63 women);hypertension     Objective:    Today's Vitals   04/04/22 1539  BP: 130/64  Weight: 165 lb 3.2 oz (74.9 kg)  Height: 5' 3.75" (1.619 m)   Body mass index is 28.58 kg/m.     04/04/2022    3:47 PM 06/07/2021    7:50 PM 06/06/2021   10:41 PM 12/12/2020   11:41 AM 07/18/2020    1:53 PM 01/24/2018   10:37 AM 01/22/2018   11:25 AM  Advanced Directives  Does Patient Have a Medical Advance Directive? No No No No No No No  Would patient like information on creating a medical advance directive? No - Patient declined     No - Patient declined No - Patient declined    Current Medications (verified) Outpatient Encounter Medications as of 04/04/2022  Medication Sig   acetaminophen (TYLENOL) 500 MG tablet Take by mouth.   albuterol (VENTOLIN HFA) 108 (90 Base) MCG/ACT inhaler Inhale 2 puffs into the lungs every 6 (six) hours as needed for wheezing or shortness of breath.   alendronate (FOSAMAX) 70 MG tablet Take 1 tablet (70 mg total) by mouth once a week. Take with a full glass of water on an empty stomach.   amLODipine (NORVASC) 2.5 MG tablet Take 1 tablet (2.5 mg total) by mouth daily.   Biotin 1 MG CAPS Take 1 capsule by mouth daily.   calcium citrate-vitamin D (CITRACAL+D) 315-200 MG-UNIT tablet Take 1 tablet by mouth every morning.   Cholecalciferol (VITAMIN D-1000 MAX ST) 25 MCG (1000 UT) tablet Take by mouth.   cyanocobalamin 100 MCG tablet    dorzolamide-timolol (COSOPT) 22.3-6.8 MG/ML ophthalmic solution Apply to eye.   ferrous sulfate 325 (65 FE) MG tablet Take by mouth.   ibuprofen (ADVIL) 200 MG tablet Take by mouth.   Omega-3 Fatty Acids (FISH OIL) 1000 MG CAPS Take 1 capsule by mouth daily.   OPTIVE 0.5-0.9 % ophthalmic solution SMARTSIG:1 Drop(s) In Eye(s) PRN    rosuvastatin (CRESTOR) 5 MG tablet Take 1 tablet (5 mg total) by mouth daily.   Secukinumab (COSENTYX) 150 MG/ML SOSY Inject 150 mg into the skin every 30 (thirty) days.   [DISCONTINUED] Ca Phosphate-Cholecalciferol 250-10 MG-MCG CHEW Chew by mouth.   azithromycin (ZITHROMAX) 250 MG tablet Take 2 tabs today, then 1 tab daily x 4 days (Patient not taking: Reported on 04/04/2022)   hydrALAZINE (APRESOLINE) 25 MG tablet TAKE 1 TABLET BY MOUTH THREE TIMES DAILY AS NEEDED FOR SYSTOLIC BLOOD PRESSURE (Patient not taking: Reported on 04/04/2022)   hydrocortisone 2.5 % cream Apply topically. (Patient not taking: Reported on 04/04/2022)   ketoconazole (NIZORAL) 2 % cream Apply 1 Application topically daily. (Patient not taking: Reported on 04/04/2022)   promethazine-dextromethorphan (PROMETHAZINE-DM) 6.25-15 MG/5ML syrup Take 5 mLs by mouth 4 (four) times daily as needed for cough. (Patient not taking: Reported on 04/04/2022)   propranolol (INDERAL) 10 MG tablet TAKE 1 TABLET(10 MG) BY MOUTH UP TO THREE TIMES DAILY AS NEEDED FOR FAST HEART RATE (Patient not taking: Reported on 04/04/2022)   [DISCONTINUED] ferrous gluconate (FERGON) 324 MG tablet Take 324 mg by mouth every other day. (Patient not taking: Reported on 04/04/2022)   No facility-administered encounter medications on file as of 04/04/2022.    Allergies (verified) Patient has no known allergies.  History: Past Medical History:  Diagnosis Date   Arthritis    Glaucoma    Past Surgical History:  Procedure Laterality Date   CESAREAN SECTION     EYE SURGERY     SHOULDER SURGERY Left    Family History  Problem Relation Age of Onset   Breast cancer Mother 55   Breast cancer Paternal Grandmother    Social History   Socioeconomic History   Marital status: Divorced    Spouse name: Not on file   Number of children: Not on file   Years of education: Not on file   Highest education level: Not on file  Occupational History   Not on file   Tobacco Use   Smoking status: Never   Smokeless tobacco: Never  Vaping Use   Vaping Use: Never used  Substance and Sexual Activity   Alcohol use: Yes    Comment: occasional   Drug use: Never   Sexual activity: Not on file  Other Topics Concern   Not on file  Social History Narrative   Not on file   Social Determinants of Health   Financial Resource Strain: Low Risk  (04/04/2022)   Overall Financial Resource Strain (CARDIA)    Difficulty of Paying Living Expenses: Not hard at all  Food Insecurity: No Food Insecurity (04/04/2022)   Hunger Vital Sign    Worried About Running Out of Food in the Last Year: Never true    Ran Out of Food in the Last Year: Never true  Transportation Needs: No Transportation Needs (04/04/2022)   PRAPARE - Hydrologist (Medical): No    Lack of Transportation (Non-Medical): No  Physical Activity: Insufficiently Active (04/04/2022)   Exercise Vital Sign    Days of Exercise per Week: 3 days    Minutes of Exercise per Session: 30 min  Stress: No Stress Concern Present (04/04/2022)   [place]    Feeling of Stress : Only a little  Social Connections: Moderately Isolated (04/04/2022)   Social Connection and Isolation Panel [NHANES]    Frequency of Communication with Friends and Family: Twice a week    Frequency of Social Gatherings with Friends and Family: More than three times a week    Attends Religious Services: More than 4 times per year    Active Member of [employer] or Organizations: No    Attends Music therapist: Never    Marital Status: Divorced    Tobacco Counseling Counseling given: Not Answered   Clinical Intake:  Pre-visit preparation completed: Yes  Pain : No/denies pain     Nutritional Risks: None Diabetes: No  How often do you need to have someone help you when you read instructions, pamphlets, or other written materials from  your doctor or pharmacy?: 1 - Never  Diabetic?no  Interpreter Needed?: No  Information entered by :: Kirke Shaggy, LPN   Activities of Daily Living    04/04/2022    3:48 PM 03/31/2022   10:05 AM  In your present state of health, do you have any difficulty performing the following activities:  Hearing? 0 0  Vision? 0 0  Difficulty concentrating or making decisions? 0 0  Walking or climbing stairs? 0 0  Dressing or bathing? 0 0  Doing errands, shopping? 0 0  Preparing Food and eating ? N N  Using the Toilet? N N  In the past six months, have you accidently leaked urine? Darreld Mclean  Y  Do you have problems with loss of bowel control? N N  Managing your Medications? N   Managing your Finances? N N  Housekeeping or managing your Housekeeping? N N    Patient Care Team: Jearld Fenton, NP as PCP - General (Internal Medicine)  Indicate any recent Medical Services you may have received from other than [facility] providers in the past year (date may be approximate).     Assessment:   This is a routine wellness examination for Dawana.  Hearing/Vision screen Hearing Screening - Comments:: Wears aids Vision Screening - Comments:: Wears eyeglasses- [facility]  Dietary issues and exercise activities discussed: Current Exercise Habits: Home exercise routine, Type of exercise: walking, Time (Minutes): 30, Frequency (Times/Week): 3, Weekly Exercise (Minutes/Week): 90, Intensity: Mild   Goals Addressed             This Visit's Progress    DIET - EAT MORE FRUITS AND VEGETABLES         Depression Screen    04/04/2022    3:45 PM 09/05/2021    2:59 PM 08/08/2021    9:27 AM 06/21/2021    2:24 PM 06/11/2021    9:49 AM 03/20/2021    9:45 AM 02/07/2021    2:34 PM  PHQ 2/9 Scores  PHQ - 2 Score 0 0 0 0 0 0 0  PHQ- 9 Score 0 1 1 1  0 0 0    Fall Risk    04/04/2022    3:48 PM 03/31/2022   10:05 AM 09/05/2021    2:59 PM 08/08/2021    9:27 AM 06/21/2021    2:24 PM  Fall Risk   Falls in  the past year? 0 0 0 0 0  Number falls in past yr: 0  0 0 0  Injury with Fall? 0  0 0 0  Risk for fall due to : No Fall Risks  No Fall Risks No Fall Risks No Fall Risks  Follow up Falls prevention discussed  Falls evaluation completed Falls evaluation completed Falls evaluation completed    [place]:  Any stairs in or around the home? Yes  If so, are there any without handrails? No  Home free of loose throw rugs in walkways, pet beds, electrical cords, etc? Yes  Adequate lighting in your home to reduce risk of falls? Yes   ASSISTIVE DEVICES UTILIZED TO PREVENT FALLS:  Life alert? No  Use of a cane, walker or w/c? No  Grab bars in the bathroom? Yes  Shower chair or bench in shower? No  Elevated toilet seat or a handicapped toilet? No   TIMED UP AND GO:  Was the test performed? Yes .  Length of time to ambulate 10 feet: 4 sec.   Gait steady and fast without use of assistive device  Cognitive Function:        04/04/2022    3:55 PM  6CIT Screen  What Year? 0 points  What month? 0 points  What time? 0 points  Count back from 20 0 points  Months in reverse 0 points  Repeat phrase 2 points  Total Score 2 points    Immunizations Immunization History  Administered Date(s) Administered   Fluad Quad(high Dose 65+) 03/20/2021   Influenza Inj Mdck Quad Pf 03/19/2018, 03/04/2019   Influenza, High Dose Seasonal PF 05/07/2016, 03/27/2017   Influenza,inj,Quad PF,6+ Mos 04/17/2014, 04/06/2015, 03/21/2020   PFIZER Comirnaty(Gray Top)Covid-19 Tri-Sucrose Vaccine 07/04/2019, 07/26/2019, 03/27/2020, 11/09/2020  PFIZER(Purple Top)SARS-COV-2 Vaccination 07/04/2019, 07/26/2019   PPD Test 08/25/2016, 08/02/2019   Pneumococcal Conjugate-13 05/29/2017   Pneumococcal Polysaccharide-23 01/20/2019   Rsv, Bivalent, Protein Subunit Rsvpref,pf Evans Lance) 03/11/2022   Tdap 12/27/2012    TDAP status: Up to date  Flu Vaccine status: Due, Education has been  provided regarding the importance of this vaccine. Advised may receive this vaccine at local pharmacy or Health Dept. Aware to provide a copy of the vaccination record if obtained from local pharmacy or Health Dept. Verbalized acceptance and understanding.  Pneumococcal vaccine status: Up to date  Covid-19 vaccine status: Completed vaccines  Qualifies for Shingles Vaccine? Yes   Zostavax completed No   Shingrix Completed?: No.    Education has been provided regarding the importance of this vaccine. Patient has been advised to call insurance company to determine out of pocket expense if they have not yet received this vaccine. Advised may also receive vaccine at local pharmacy or Health Dept. Verbalized acceptance and understanding.  Screening Tests Health Maintenance  Topic Date Due   Zoster Vaccines- Shingrix (1 of 2) Never done   COVID-19 Vaccine (7 - Pfizer risk series) 04/05/2022 (Originally 01/04/2021)   INFLUENZA VACCINE  08/31/2022 (Originally 12/31/2021)   MAMMOGRAM  08/20/2022   TETANUS/TDAP  12/28/2022   Medicare Annual Wellness (AWV)  04/05/2023   COLONOSCOPY (Pts 45-18yrs Insurance coverage will need to be confirmed)  04/07/2029   Pneumonia Vaccine 15+ Years old  Completed   DEXA SCAN  Completed   Hepatitis C Screening  Completed   HPV VACCINES  Aged Out    Health Maintenance  Health Maintenance Due  Topic Date Due   Zoster Vaccines- Shingrix (1 of 2) Never done    Colorectal cancer screening: Type of screening: Colonoscopy. Completed 04/08/19. Repeat every 5 years  Mammogram status: Completed 08/19/21. Repeat every year  Bone Density status: Completed 04/09/20. Results reflect: Bone density results: OSTEOPOROSIS. Repeat every 2 years.- states had one at [place]: (Low Dose CT Chest recommended if Age 8-80 years, 30 pack-year currently smoking OR have quit w/in 15years.) does not qualify.   Additional Screening:  Hepatitis C  Screening: does qualify; Completed 03/20/21  Vision Screening: Recommended annual ophthalmology exams for early detection of glaucoma and other disorders of the eye. Is the patient up to date with their annual eye exam?  Yes  Who is the provider or what is the name of the office in which the patient attends annual eye exams? [place] If pt is not established with a provider, would they like to be referred to a provider to establish care? No .   Dental Screening: Recommended annual dental exams for proper oral hygiene  Community Resource Referral / Chronic Care Management: CRR required this visit?  No   CCM required this visit?  No      Plan:     I have personally reviewed and noted the following in the patient's chart:   Medical and social history Use of alcohol, tobacco or illicit drugs  Current medications and supplements including opioid prescriptions. Patient is not currently taking opioid prescriptions. Functional ability and status Nutritional status Physical activity Advanced directives List of other physicians Hospitalizations, surgeries, and ER visits in previous 12 months Vitals Screenings to include cognitive, depression, and falls Referrals and appointments  In addition, I have reviewed and discussed with patient certain preventive protocols, quality metrics, and best practice recommendations. A written personalized care plan for preventive services as well  as general preventive health recommendations were provided to patient.     Dionisio David, LPN   [id number]   Nurse Notes: none

## 2022-04-04 NOTE — Patient Instructions (Signed)
Anita Mathews , Thank you for taking time to come for your Medicare Wellness Visit. I appreciate your ongoing commitment to your health goals. Please review the following plan we discussed and let me know if I can assist you in the future.   Screening recommendations/referrals: Colonoscopy: 04/08/19 Mammogram: 08/19/21 Bone Density: states had this year Recommended yearly ophthalmology/optometry visit for glaucoma screening and checkup Recommended yearly dental visit for hygiene and checkup  Vaccinations: Influenza vaccine: 03/20/21 Pneumococcal vaccine: 01/20/19 Tdap vaccine: 12/27/12 Shingles vaccine: states had one shingrix shot in 2022 @ [facility]   Covid-19:07/04/19, 07/26/19, 03/27/20, 11/09/20  Advanced directives: no  Conditions/risks identified: none  Next appointment: Follow up in one year for your annual wellness visit    Preventive Care 42 Years and Older, Female Preventive care refers to lifestyle choices and visits with your health care provider that can promote health and wellness. What does preventive care include? A yearly physical exam. This is also called an annual well check. Dental exams once or twice a year. Routine eye exams. Ask your health care provider how often you should have your eyes checked. Personal lifestyle choices, including: Daily care of your teeth and gums. Regular physical activity. Eating a healthy diet. Avoiding tobacco and drug use. Limiting alcohol use. Practicing safe sex. Taking low-dose aspirin every day. Taking vitamin and mineral supplements as recommended by your health care provider. What happens during an annual well check? The services and screenings done by your health care provider during your annual well check will depend on your age, overall health, lifestyle risk factors, and family history of disease. Counseling  Your health care provider may ask you questions about your: Alcohol use. Tobacco use. Drug use. Emotional  well-being. Home and relationship well-being. Sexual activity. Eating habits. History of falls. Memory and ability to understand (cognition). Work and work Statistician. Reproductive health. Screening  You may have the following tests or measurements: Height, weight, and BMI. Blood pressure. Lipid and cholesterol levels. These may be checked every 5 years, or more frequently if you are over 45 years old. Skin check. Lung cancer screening. You may have this screening every year starting at age 74 if you have a 30-pack-year history of smoking and currently smoke or have quit within the past 15 years. Fecal occult blood test (FOBT) of the stool. You may have this test every year starting at age 75. Flexible sigmoidoscopy or colonoscopy. You may have a sigmoidoscopy every 5 years or a colonoscopy every 10 years starting at age 2. Hepatitis C blood test. Hepatitis B blood test. Sexually transmitted disease (STD) testing. Diabetes screening. This is done by checking your blood sugar (glucose) after you have not eaten for a while (fasting). You may have this done every 1-3 years. Bone density scan. This is done to screen for osteoporosis. You may have this done starting at age 35. Mammogram. This may be done every 1-2 years. Talk to your health care provider about how often you should have regular mammograms. Talk with your health care provider about your test results, treatment options, and if necessary, the need for more tests. Vaccines  Your health care provider may recommend certain vaccines, such as: Influenza vaccine. This is recommended every year. Tetanus, diphtheria, and acellular pertussis (Tdap, Td) vaccine. You may need a Td booster every 10 years. Zoster vaccine. You may need this after age 32. Pneumococcal 13-valent conjugate (PCV13) vaccine. One dose is recommended after age 15. Pneumococcal polysaccharide (PPSV23) vaccine. One dose is recommended after  age 2. Talk to your  health care provider about which screenings and vaccines you need and how often you need them. This information is not intended to replace advice given to you by your health care provider. Make sure you discuss any questions you have with your health care provider. Document Released: 06/15/2015 Document Revised: 02/06/2016 Document Reviewed: 03/20/2015 Elsevier Interactive Patient Education  2017 [place] Prevention in the Home Falls can cause injuries. They can happen to people of all ages. There are many things you can do to make your home safe and to help prevent falls. What can I do on the outside of my home? Regularly fix the edges of walkways and driveways and fix any cracks. Remove anything that might make you trip as you walk through a door, such as a raised step or threshold. Trim any bushes or trees on the path to your home. Use bright outdoor lighting. Clear any walking paths of anything that might make someone trip, such as rocks or tools. Regularly check to see if handrails are loose or broken. Make sure that both sides of any steps have handrails. Any raised decks and porches should have guardrails on the edges. Have any leaves, snow, or ice cleared regularly. Use sand or salt on walking paths during winter. Clean up any spills in your garage right away. This includes oil or grease spills. What can I do in the bathroom? Use night lights. Install grab bars by the toilet and in the tub and shower. Do not use towel bars as grab bars. Use non-skid mats or decals in the tub or shower. If you need to sit down in the shower, use a plastic, non-slip stool. Keep the floor dry. Clean up any water that spills on the floor as soon as it happens. Remove soap buildup in the tub or shower regularly. Attach bath mats securely with double-sided non-slip rug tape. Do not have throw rugs and other things on the floor that can make you trip. What can I do in the bedroom? Use night  lights. Make sure that you have a light by your bed that is easy to reach. Do not use any sheets or blankets that are too big for your bed. They should not hang down onto the floor. Have a firm chair that has side arms. You can use this for support while you get dressed. Do not have throw rugs and other things on the floor that can make you trip. What can I do in the kitchen? Clean up any spills right away. Avoid walking on wet floors. Keep items that you use a lot in easy-to-reach places. If you need to reach something above you, use a strong step stool that has a grab bar. Keep electrical cords out of the way. Do not use floor polish or wax that makes floors slippery. If you must use wax, use non-skid floor wax. Do not have throw rugs and other things on the floor that can make you trip. What can I do with my stairs? Do not leave any items on the stairs. Make sure that there are handrails on both sides of the stairs and use them. Fix handrails that are broken or loose. Make sure that handrails are as long as the stairways. Check any carpeting to make sure that it is firmly attached to the stairs. Fix any carpet that is loose or worn. Avoid having throw rugs at the top or bottom of the stairs. If you do  have throw rugs, attach them to the floor with carpet tape. Make sure that you have a light switch at the top of the stairs and the bottom of the stairs. If you do not have them, ask someone to add them for you. What else can I do to help prevent falls? Wear shoes that: Do not have high heels. Have rubber bottoms. Are comfortable and fit you well. Are closed at the toe. Do not wear sandals. If you use a stepladder: Make sure that it is fully opened. Do not climb a closed stepladder. Make sure that both sides of the stepladder are locked into place. Ask someone to hold it for you, if possible. Clearly mark and make sure that you can see: Any grab bars or handrails. First and last  steps. Where the edge of each step is. Use tools that help you move around (mobility aids) if they are needed. These include: Canes. Walkers. Scooters. Crutches. Turn on the lights when you go into a dark area. Replace any light bulbs as soon as they burn out. Set up your furniture so you have a clear path. Avoid moving your furniture around. If any of your floors are uneven, fix them. If there are any pets around you, be aware of where they are. Review your medicines with your doctor. Some medicines can make you feel dizzy. This can increase your chance of falling. Ask your doctor what other things that you can do to help prevent falls. This information is not intended to replace advice given to you by your health care provider. Make sure you discuss any questions you have with your health care provider. Document Released: 03/15/2009 Document Revised: 10/25/2015 Document Reviewed: 06/23/2014 Elsevier Interactive Patient Education  2017 [employer].

## 2022-04-11 ENCOUNTER — Ambulatory Visit: Payer: Medicare Other

## 2022-04-18 ENCOUNTER — Ambulatory Visit (INDEPENDENT_AMBULATORY_CARE_PROVIDER_SITE_OTHER): Payer: Medicare Other

## 2022-04-18 ENCOUNTER — Ambulatory Visit: Payer: Medicare Other

## 2022-04-18 DIAGNOSIS — Z23 Encounter for immunization: Secondary | ICD-10-CM | POA: Diagnosis not present

## 2022-04-23 ENCOUNTER — Encounter: Payer: Self-pay | Admitting: Internal Medicine

## 2022-04-25 ENCOUNTER — Ambulatory Visit: Payer: Medicare Other

## 2022-04-29 ENCOUNTER — Other Ambulatory Visit: Payer: Self-pay | Admitting: Internal Medicine

## 2022-04-29 ENCOUNTER — Encounter: Payer: Self-pay | Admitting: Internal Medicine

## 2022-04-29 MED ORDER — ALENDRONATE SODIUM 70 MG PO TABS: 70.0000 mg | ORAL_TABLET | ORAL | 1 refills | Status: DC

## 2022-04-29 MED ORDER — ROSUVASTATIN CALCIUM 5 MG PO TABS: 5.0000 mg | ORAL_TABLET | Freq: Every day | ORAL | 1 refills | Status: DC

## 2022-04-30 NOTE — Telephone Encounter (Signed)
Requested Prescriptions  Pending Prescriptions Disp Refills   rosuvastatin (CRESTOR) 5 MG tablet [Pharmacy Med Name: ROSUVASTATIN 5MG  TABLETS] 90 tablet 1    Sig: TAKE 1 TABLET(5 MG) BY MOUTH DAILY     Cardiovascular:  Antilipid - Statins 2 Failed - 04/29/2022  3:50 PM      Failed - Lipid Panel in normal range within the last 12 months    Cholesterol  Date Value Ref Range Status  03/20/2022 170 <200 mg/dL Final   LDL Cholesterol (Calc)  Date Value Ref Range Status  03/20/2022 75 mg/dL (calc) Final    Comment:    Reference range: <100 . Desirable range <100 mg/dL for primary prevention;   <70 mg/dL for patients with CHD or diabetic patients  with > or = 2 CHD risk factors. 03/22/2022 LDL-C is now calculated using the Martin-Hopkins  calculation, which is a validated novel method providing  better accuracy than the Friedewald equation in the  estimation of LDL-C.  Marland Kitchen et al. Horald Pollen. Lenox Ahr): 2061-2068  (http://education.[facility].com/faq/FAQ164)    HDL  Date Value Ref Range Status  03/20/2022 72 > OR = 50 mg/dL Final   Triglycerides  Date Value Ref Range Status  03/20/2022 142 <150 mg/dL Final         Passed - Cr in normal range and within 360 days    Creat  Date Value Ref Range Status  03/20/2022 0.86 0.60 - 1.00 mg/dL Final         Passed - Patient is not pregnant      Passed - Valid encounter within last 12 months    Recent Outpatient Visits           1 month ago Acute nasopharyngitis   [facility] [place], Mullins, NP   1 month ago Mixed hyperlipidemia   [facility] [place], Mullins W, NP   4 months ago Acute intractable headache, unspecified headache type   [facility] [place], Mullins, NP   5 months ago Viral sinusitis   [facility] [place], Mullins, NP   7 months ago Encounter for general adult medical examination with abnormal findings   [facility], [facility], NP        Future Appointments             In 2 days Salvadore Oxford, Sampson Si, NP [facility], [facility]

## 2022-05-02 ENCOUNTER — Ambulatory Visit: Payer: Medicare Other | Admitting: Internal Medicine

## 2022-05-07 DIAGNOSIS — H40001 Preglaucoma, unspecified, right eye: Secondary | ICD-10-CM | POA: Diagnosis not present

## 2022-05-07 DIAGNOSIS — Z961 Presence of intraocular lens: Secondary | ICD-10-CM | POA: Diagnosis not present

## 2022-05-07 DIAGNOSIS — H401122 Primary open-angle glaucoma, left eye, moderate stage: Secondary | ICD-10-CM | POA: Diagnosis not present

## 2022-05-07 DIAGNOSIS — H04129 Dry eye syndrome of unspecified lacrimal gland: Secondary | ICD-10-CM | POA: Diagnosis not present

## 2022-05-07 DIAGNOSIS — Z8669 Personal history of other diseases of the nervous system and sense organs: Secondary | ICD-10-CM | POA: Diagnosis not present

## 2022-06-14 ENCOUNTER — Other Ambulatory Visit: Payer: Self-pay | Admitting: Internal Medicine

## 2022-06-16 ENCOUNTER — Telehealth (INDEPENDENT_AMBULATORY_CARE_PROVIDER_SITE_OTHER): Payer: Medicare HMO | Admitting: Internal Medicine

## 2022-06-16 ENCOUNTER — Encounter: Payer: Self-pay | Admitting: Internal Medicine

## 2022-06-16 DIAGNOSIS — L659 Nonscarring hair loss, unspecified: Secondary | ICD-10-CM

## 2022-06-16 DIAGNOSIS — M2550 Pain in unspecified joint: Secondary | ICD-10-CM | POA: Diagnosis not present

## 2022-06-16 DIAGNOSIS — M159 Polyosteoarthritis, unspecified: Secondary | ICD-10-CM

## 2022-06-16 NOTE — Patient Instructions (Signed)
Alopecia Areata, Adult  Alopecia areata is a condition that causes hair loss. A person with this condition may lose hair on the scalp in patches. In some cases, a person may lose all the hair on the scalp or all the hair from the face and body. Having this condition can be emotionally difficult, but it is not dangerous. Alopecia areata is an autoimmune disease. This means that your body's defense system (immune system) mistakes normal parts of the body for germs or other things that can make you sick. When you have alopecia areata, the immune system attacks the hair follicles. What are the causes? The cause of this condition is not known. What increases the risk? You are more likely to develop this condition if you have: A family history of alopecia. A family history of another autoimmune disease, including type 1 diabetes and thyroid autoimmune disease. Eczema, asthma, and allergies. Down syndrome. What are the signs or symptoms? The main symptom of this condition is round spots of patchy hair loss on the scalp. The spots may be mildly itchy. Other symptoms include: Short dark hairs in the bald patches that are wider at the top (exclamation point hairs). Dents, white spots, or lines in the fingernails or toenails. Balding and body hair loss. This is rare. Alopecia areata usually develops in childhood, but it can develop at any age. For some people, their hair grows back on its own and hair loss does not happen again. For others, their hair may fall out and grow back in cycles. The hair loss may last many years. How is this diagnosed? This condition is diagnosed based on your symptoms and family history. Your health care provider will also check your scalp skin, teeth, and nails. Your health care provider may refer you to a specialist in hair and skin disorders (dermatologist). You may also have tests, including: A hair pull test. Blood tests or other screening tests to check for autoimmune  diseases, such as thyroid disease or diabetes. Skin biopsy to confirm the diagnosis. A procedure to examine the skin with a lighted magnifying instrument (dermoscopy). How is this treated? There is no cure for alopecia areata. The goals of treatment are to promote the regrowth of hair and prevent the immune system from overreacting. No single treatment is right for all people with alopecia areata. It depends on the type of hair loss you have and how severe it is. Work with your health care provider to find the best treatment for you. Treatment may include: Regular checkups to make sure the condition is not getting worse . This is called watchful waiting. Using steroid creams or pills for 6-8 weeks to stop the immune reaction and help hair to regrow more quickly. Using other medicines on your skin (topical medicines) to change the immune system response and support the hair growth cycle. Steroid injections. Therapy and counseling with a support group or therapist if you are having trouble coping with hair loss. Follow these instructions at home: Medicines Apply topical creams only as told by your health care provider. Take over-the-counter and prescription medicines only as told by your health care provider. General instructions Learn as much as you can about your condition. Consider getting a wig or products to make hair look fuller or to cover bald spots, if you feel uncomfortable with your appearance. Get therapy or counseling if you are having a hard time coping with hair loss. Ask your health care provider to recommend a counselor or support group. Keep   all follow-up visits as told by your health care provider. This is important. Where to find more information National Alopecia Areata Foundation: naaf.org Contact a health care provider if: Your hair loss gets worse, even with treatment. You have new symptoms. You are struggling emotionally. Get help right away if: You have a sudden  worsening of the hair loss. Summary Alopecia areata is an autoimmune condition that makes your body's defense system (immune system) attack the hair follicles. This causes you to lose hair. Having this condition can be emotionally difficult, but it is not dangerous. Treatments may include regular checkups to make sure that the condition is not getting worse, medicines, and steroid injections. This information is not intended to replace advice given to you by your health care provider. Make sure you discuss any questions you have with your health care provider. Document Revised: 08/02/2019 Document Reviewed: 08/02/2019 Elsevier Patient Education  2023 Elsevier Inc.  

## 2022-06-16 NOTE — Progress Notes (Signed)
Virtual Visit via Video Note  I connected with Anita Mathews on 06/16/22 at 11:20 AM EST by a video enabled telemedicine application and verified that I am speaking with the correct person using two identifiers.  Location: Patient: Home Provider: Office  Persons participating in this video call: Nicki Reaper, NP and Redge Gainer   I discussed the limitations of evaluation and management by telemedicine and the availability of in person appointments. The patient expressed understanding and agreed to proceed.  History of Present Illness:  Patient wanted to discuss changing her supplements. She would like to stop taking Vit B12 and Biotin separately and would like to start taking a combined B complex. She would also like to start Vit C and would like to know the specific dose for this. She also has questions about Horsetail supplement for thinning hair and inflammation.   Past Medical History:  Diagnosis Date   Arthritis    Glaucoma     Current Outpatient Medications  Medication Sig Dispense Refill   acetaminophen (TYLENOL) 500 MG tablet Take by mouth.     albuterol (VENTOLIN HFA) 108 (90 Base) MCG/ACT inhaler Inhale 2 puffs into the lungs every 6 (six) hours as needed for wheezing or shortness of breath. 8 g 0   alendronate (FOSAMAX) 70 MG tablet Take 1 tablet (70 mg total) by mouth once a week. Take with a full glass of water on an empty stomach. 12 tablet 1   amLODipine (NORVASC) 2.5 MG tablet Take 1 tablet (2.5 mg total) by mouth daily. 90 tablet 0   azithromycin (ZITHROMAX) 250 MG tablet Take 2 tabs today, then 1 tab daily x 4 days (Patient not taking: Reported on 04/04/2022) 6 tablet 0   Biotin 1 MG CAPS Take 1 capsule by mouth daily.     calcium citrate-vitamin D (CITRACAL+D) 315-200 MG-UNIT tablet Take 1 tablet by mouth every morning.     Cholecalciferol (VITAMIN D-1000 MAX ST) 25 MCG (1000 UT) tablet Take by mouth.     cyanocobalamin 100 MCG tablet      dorzolamide-timolol  (COSOPT) 22.3-6.8 MG/ML ophthalmic solution Apply to eye.     ferrous sulfate 325 (65 FE) MG tablet Take by mouth.     hydrALAZINE (APRESOLINE) 25 MG tablet TAKE 1 TABLET BY MOUTH THREE TIMES DAILY AS NEEDED FOR SYSTOLIC BLOOD PRESSURE (Patient not taking: Reported on 04/04/2022) 270 tablet 0   hydrocortisone 2.5 % cream Apply topically. (Patient not taking: Reported on 04/04/2022)     ibuprofen (ADVIL) 200 MG tablet Take by mouth.     ketoconazole (NIZORAL) 2 % cream Apply 1 Application topically daily. (Patient not taking: Reported on 04/04/2022) 30 g 0   Omega-3 Fatty Acids (FISH OIL) 1000 MG CAPS Take 1 capsule by mouth daily.     OPTIVE 0.5-0.9 % ophthalmic solution SMARTSIG:1 Drop(s) In Eye(s) PRN     promethazine-dextromethorphan (PROMETHAZINE-DM) 6.25-15 MG/5ML syrup Take 5 mLs by mouth 4 (four) times daily as needed for cough. (Patient not taking: Reported on 04/04/2022) 118 mL 0   propranolol (INDERAL) 10 MG tablet TAKE 1 TABLET(10 MG) BY MOUTH UP TO THREE TIMES DAILY AS NEEDED FOR FAST HEART RATE (Patient not taking: Reported on 04/04/2022) 270 tablet 0   rosuvastatin (CRESTOR) 5 MG tablet Take 1 tablet (5 mg total) by mouth daily. 90 tablet 1   Secukinumab (COSENTYX) 150 MG/ML SOSY Inject 150 mg into the skin every 30 (thirty) days.     No current facility-administered medications for this  visit.    No Known Allergies  Family History  Problem Relation Age of Onset   Breast cancer Mother 67   Breast cancer Paternal Grandmother     Social History   Socioeconomic History   Marital status: Divorced    Spouse name: Not on file   Number of children: Not on file   Years of education: Not on file   Highest education level: Not on file  Occupational History   Not on file  Tobacco Use   Smoking status: Never   Smokeless tobacco: Never  Vaping Use   Vaping Use: Never used  Substance and Sexual Activity   Alcohol use: Yes    Comment: occasional   Drug use: Never   Sexual  activity: Not on file  Other Topics Concern   Not on file  Social History Narrative   Not on file   Social Determinants of Health   Financial Resource Strain: Low Risk  (04/04/2022)   Overall Financial Resource Strain (CARDIA)    Difficulty of Paying Living Expenses: Not hard at all  Food Insecurity: No Food Insecurity (04/04/2022)   Hunger Vital Sign    Worried About Running Out of Food in the Last Year: Never true    [place] in the Last Year: Never true  Transportation Needs: No Transportation Needs (04/04/2022)   PRAPARE - Hydrologist (Medical): No    Lack of Transportation (Non-Medical): No  Physical Activity: Insufficiently Active (04/04/2022)   Exercise Vital Sign    Days of Exercise per Week: 3 days    Minutes of Exercise per Session: 30 min  Stress: No Stress Concern Present (04/04/2022)   [place]    Feeling of Stress : Only a little  Social Connections: Moderately Isolated (04/04/2022)   Social Connection and Isolation Panel [NHANES]    Frequency of Communication with Friends and Family: Twice a week    Frequency of Social Gatherings with Friends and Family: More than three times a week    Attends Religious Services: More than 4 times per year    Active Member of [employer] or Organizations: No    Attends Archivist Meetings: Never    Marital Status: Divorced  Human resources officer Violence: Not At Risk (04/04/2022)   Humiliation, Afraid, Rape, and Kick questionnaire    Fear of Current or Ex-Partner: No    Emotionally Abused: No    Physically Abused: No    Sexually Abused: No     Constitutional: Denies fever, malaise, fatigue, headache or abrupt weight changes.  HEENT: Denies eye pain, eye redness, ear pain, ringing in the ears, wax buildup, runny nose, nasal congestion, bloody nose, or sore throat. Respiratory: Denies difficulty breathing, shortness of breath,  cough or sputum production.   Cardiovascular: Denies chest pain, chest tightness, palpitations or swelling in the hands or feet.  Gastrointestinal: Denies abdominal pain, bloating, constipation, diarrhea or blood in the stool.  GU: Denies urgency, frequency, pain with urination, burning sensation, blood in urine, odor or discharge. Musculoskeletal: Pt reports joint pain. Denies decrease in range of motion, difficulty with gait, muscle pain or joint swelling.  Skin: Patient reports thinning hair.  Denies redness, rashes, lesions or ulcercations.  Neurological: Denies dizziness, difficulty with memory, difficulty with speech or problems with balance and coordination.  Psych: Denies anxiety, depression, SI/HI.  No other specific complaints in a complete review of systems (except as listed in  HPI above).    Observations/Objective:   Wt Readings from Last 3 Encounters:  04/04/22 165 lb 3.2 oz (74.9 kg)  03/25/22 165 lb (74.8 kg)  03/20/22 164 lb (74.4 kg)    General: Appears her stated age, well developed, well nourished in NAD. Pulmonary/Chest: Normal effort No respiratory distress.   Neurological: Alert and oriented.  Psychiatric: Mood and affect normal. Behavior is normal. Judgment and thought content normal.     BMET    Component Value Date/Time   NA 140 03/20/2022 1343   K 4.0 03/20/2022 1343   CL 104 03/20/2022 1343   CO2 27 03/20/2022 1343   GLUCOSE 119 03/20/2022 1343   BUN 14 03/20/2022 1343   CREATININE 0.86 03/20/2022 1343   CALCIUM 10.0 03/20/2022 1343   GFRNONAA >60 06/06/2021 2244   GFRAA >60 01/24/2018 1056    Lipid Panel     Component Value Date/Time   CHOL 170 03/20/2022 1343   TRIG 142 03/20/2022 1343   HDL 72 03/20/2022 1343   CHOLHDL 2.4 03/20/2022 1343   LDLCALC 75 03/20/2022 1343    CBC    Component Value Date/Time   WBC 6.8 03/20/2022 1343   RBC 4.34 03/20/2022 1343   HGB 13.1 03/20/2022 1343   HCT 38.6 03/20/2022 1343   PLT 321 03/20/2022  1343   MCV 88.9 03/20/2022 1343   MCH 30.2 03/20/2022 1343   MCHC 33.9 03/20/2022 1343   RDW 12.4 03/20/2022 1343   LYMPHSABS 2.2 01/24/2018 1056   MONOABS 0.9 01/24/2018 1056   EOSABS 0.1 01/24/2018 1056   BASOSABS 0.1 01/24/2018 1056    Hgb A1C No results found for: "HGBA1C"     Assessment and Plan:  Thinning Hair, Joint Pain:  Ok to take B complex instead of B12 and Biotion Ok to take up to 1500 mg of Vitamin C Ask pharmacist about Horsetail Extract- I could not locate this in our medical database.  RTC in 3 months for annual exam  Follow Up Instructions:    I discussed the assessment and treatment plan with the patient. The patient was provided an opportunity to ask questions and all were answered. The patient agreed with the plan and demonstrated an understanding of the instructions.   The patient was advised to call back or seek an in-person evaluation if the symptoms worsen or if the condition fails to improve as anticipated.   Webb Silversmith, NP

## 2022-06-16 NOTE — Telephone Encounter (Signed)
Requested Prescriptions  Pending Prescriptions Disp Refills   amLODipine (NORVASC) 2.5 MG tablet [Pharmacy Med Name: AMLODIPINE BESYLATE 2.5MG  TABLETS] 90 tablet 0    Sig: TAKE 1 TABLET(2.5 MG) BY MOUTH DAILY     Cardiovascular: Calcium Channel Blockers 2 Passed - 06/14/2022  5:29 PM      Passed - Last BP in normal range    BP Readings from Last 1 Encounters:  04/04/22 130/64         Passed - Last Heart Rate in normal range    Pulse Readings from Last 1 Encounters:  03/25/22 (!) 57         Passed - Valid encounter within last 6 months    Recent Outpatient Visits           Today Thinning hair   [facility] Moore, Coralie Keens, NP   2 months ago Acute nasopharyngitis   [facility] [place], Coralie Keens, NP   2 months ago Mixed hyperlipidemia   [facility] [place], [place] W, NP   5 months ago Acute intractable headache, unspecified headache type   [facility] [place], Coralie Keens, NP   6 months ago Viral sinusitis   [facility] [place], Coralie Keens, NP       Future Appointments             In 3 months Baity, Coralie Keens, NP [facility], [facility]

## 2022-06-20 ENCOUNTER — Telehealth: Payer: Medicare HMO | Admitting: Family Medicine

## 2022-06-20 ENCOUNTER — Encounter: Payer: Self-pay | Admitting: Internal Medicine

## 2022-06-20 ENCOUNTER — Encounter: Payer: Self-pay | Admitting: Family Medicine

## 2022-06-20 DIAGNOSIS — U071 COVID-19: Secondary | ICD-10-CM

## 2022-06-20 MED ORDER — NIRMATRELVIR/RITONAVIR (PAXLOVID)TABLET: 3.0000 | ORAL_TABLET | Freq: Two times a day (BID) | ORAL | 0 refills | Status: AC

## 2022-06-20 NOTE — Progress Notes (Signed)
Virtual Visit Consent   Anita Mathews, you are scheduled for a virtual visit with a [facility] provider today. Just as with appointments in the office, your consent must be obtained to participate. Your consent will be active for this visit and any virtual visit you may have with one of our providers in the next 365 days. If you have a MyChart account, a copy of this consent can be sent to you electronically.  As this is a virtual visit, video technology does not allow for your provider to perform a traditional examination. This may limit your provider's ability to fully assess your condition. If your provider identifies any concerns that need to be evaluated in person or the need to arrange testing (such as labs, EKG, etc.), we will make arrangements to do so. Although advances in technology are sophisticated, we cannot ensure that it will always work on either your end or our end. If the connection with a video visit is poor, the visit may have to be switched to a telephone visit. With either a video or telephone visit, we are not always able to ensure that we have a secure connection.  By engaging in this virtual visit, you consent to the provision of healthcare and authorize for your insurance to be billed (if applicable) for the services provided during this visit. Depending on your insurance coverage, you may receive a charge related to this service.  I need to obtain your verbal consent now. Are you willing to proceed with your visit today? Anita Mathews has provided verbal consent on 06/20/2022 for a virtual visit (video or telephone). Perlie Mayo, NP  Date: 06/20/2022 3:12 PM  Virtual Visit via Video Note   I, Perlie Mayo, connected with  Anita Mathews  ([id number], [id number]) on 06/20/22 at  3:30 PM EST by a video-enabled telemedicine application and verified that I am speaking with the correct person using two identifiers.  Location: Patient: Virtual Visit Location Patient:  Home Provider: Virtual Visit Location Provider: Home Office   I discussed the limitations of evaluation and management by telemedicine and the availability of in person appointments. The patient expressed understanding and agreed to proceed.    History of Present Illness: Anita Mathews who was assigned Mathews at birth, and is being seen today for COVID + today. Symptoms started with coughing on Tuesday- and thought it was just dry air- waxing and waning with her symptoms. Associated symptoms- nasal congestion, coughing.  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   Psoriasis 04/03/2022   Osteoporosis, post-menopausal 06/27/2021   Primary hypertension 06/11/2021   Psoriatic arthritis ([place]) 02/07/2021   Osteoarthritis 02/07/2021   HLD (hyperlipidemia) 02/07/2021   Varicose veins of both lower extremities with pain 02/07/2021   Overweight with body mass index (BMI) of 28 to 28.9 in adult 02/07/2021    Allergies: No Known Allergies Medications:  Current Outpatient Medications:    acetaminophen (TYLENOL) 500 MG tablet, Take by mouth., Disp: , Rfl:    albuterol (VENTOLIN HFA) 108 (90 Base) MCG/ACT inhaler, Inhale 2 puffs into the lungs every 6 (six) hours as needed for wheezing or shortness of breath., Disp: 8 g, Rfl: 0   alendronate (FOSAMAX) 70 MG tablet, Take 1 tablet (70 mg total) by mouth once  a week. Take with a full glass of water on an empty stomach., Disp: 12 tablet, Rfl: 1   amLODipine (NORVASC) 2.5 MG tablet, TAKE 1 TABLET(2.5 MG) BY MOUTH DAILY, Disp: 90 tablet, Rfl: 0   Biotin 1 MG CAPS, Take 1 capsule by mouth daily., Disp: , Rfl:    calcium citrate-vitamin D (CITRACAL+D) 315-200 MG-UNIT tablet, Take 1 tablet by mouth every morning., Disp: , Rfl:    Cholecalciferol (VITAMIN D-1000 MAX ST) 25 MCG  (1000 UT) tablet, Take by mouth., Disp: , Rfl:    cyanocobalamin 100 MCG tablet, , Disp: , Rfl:    dorzolamide-timolol (COSOPT) 22.3-6.8 MG/ML ophthalmic solution, Apply to eye., Disp: , Rfl:    ferrous sulfate 325 (65 FE) MG tablet, Take by mouth., Disp: , Rfl:    hydrALAZINE (APRESOLINE) 25 MG tablet, TAKE 1 TABLET BY MOUTH THREE TIMES DAILY AS NEEDED FOR SYSTOLIC BLOOD PRESSURE, Disp: 270 tablet, Rfl: 0   hydrocortisone 2.5 % cream, Apply topically., Disp: , Rfl:    ibuprofen (ADVIL) 200 MG tablet, Take by mouth., Disp: , Rfl:    ketoconazole (NIZORAL) 2 % cream, Apply 1 Application topically daily., Disp: 30 g, Rfl: 0   Omega-3 Fatty Acids (FISH OIL) 1000 MG CAPS, Take 1 capsule by mouth daily., Disp: , Rfl:    OPTIVE 0.5-0.9 % ophthalmic solution, SMARTSIG:1 Drop(s) In Eye(s) PRN, Disp: , Rfl:    promethazine-dextromethorphan (PROMETHAZINE-DM) 6.25-15 MG/5ML syrup, Take 5 mLs by mouth 4 (four) times daily as needed for cough., Disp: 118 mL, Rfl: 0   rosuvastatin (CRESTOR) 5 MG tablet, Take 1 tablet (5 mg total) by mouth daily., Disp: 90 tablet, Rfl: 1   Secukinumab (COSENTYX) 150 MG/ML SOSY, Inject 150 mg into the skin every 30 (thirty) days., Disp: , Rfl:   Observations/Objective: Patient is well-developed, well-nourished in no acute distress.  Resting comfortably  at home.  Head is normocephalic, atraumatic.  No labored breathing.  Speech is clear and coherent with logical content.  Patient is alert and oriented at baseline.    Assessment and Plan:   1. COVID-19  - nirmatrelvir/ritonavir (PAXLOVID) 20 x 150 MG & 10 x 100MG  TABS; Take 3 tablets by mouth 2 (two) times daily for 5 days. (Take nirmatrelvir 150 mg two tablets twice daily for 5 days and ritonavir 100 mg one tablet twice daily for 5 days) Patient GFR is 72 10/23  Dispense: 30 tablet; Refill: 0  - Continue OTC symptomatic management of choice - Will send OTC vitamins and supplement information through AVS - Take as  prescribed - Patient enrolled in MyChart symptom monitoring - Push fluids - Rest as needed - Discussed return precautions and when to seek in-person evaluation, sent via AVS as well   Follow Up Instructions: I discussed the assessment and treatment plan with the patient. The patient was provided an opportunity to ask questions and all were answered. The patient agreed with the plan and demonstrated an understanding of the instructions.  A copy of instructions were sent to the patient via MyChart unless otherwise noted below.    The patient was advised to call back or seek an in-person evaluation if the symptoms worsen or if the condition fails to improve as anticipated.  Time:  I spent 10 minutes with the patient via telehealth technology discussing the above problems/concerns.    Perlie Mayo, NP

## 2022-06-20 NOTE — Patient Instructions (Addendum)
Anita Mathews, thank you for joining Anita Mayo, Anita Mathews for today's virtual visit.  While this provider is not your primary care provider (PCP), if your PCP is located in our provider database this encounter information will be shared with them immediately following your visit.   [facility] account gives you access to today's visit and all your visits, tests, and labs performed at [facility] " click here if you don't have a [facility] account or go to [email or web address]  Consent: (Patient) Anita Mathews provided verbal consent for this virtual visit at the beginning of the encounter.  Current Medications:  Current Outpatient Medications:    nirmatrelvir/ritonavir (PAXLOVID) 20 x 150 MG & 10 x 100MG  TABS, Take 3 tablets by mouth 2 (two) times daily for 5 days. (Take nirmatrelvir 150 mg two tablets twice daily for 5 days and ritonavir 100 mg one tablet twice daily for 5 days) Patient GFR is 72 10/23, Disp: 30 tablet, Rfl: 0   acetaminophen (TYLENOL) 500 MG tablet, Take by mouth., Disp: , Rfl:    albuterol (VENTOLIN HFA) 108 (90 Base) MCG/ACT inhaler, Inhale 2 puffs into the lungs every 6 (six) hours as needed for wheezing or shortness of breath., Disp: 8 g, Rfl: 0   alendronate (FOSAMAX) 70 MG tablet, Take 1 tablet (70 mg total) by mouth once a week. Take with a full glass of water on an empty stomach., Disp: 12 tablet, Rfl: 1   amLODipine (NORVASC) 2.5 MG tablet, TAKE 1 TABLET(2.5 MG) BY MOUTH DAILY, Disp: 90 tablet, Rfl: 0   Biotin 1 MG CAPS, Take 1 capsule by mouth daily., Disp: , Rfl:    calcium citrate-vitamin D (CITRACAL+D) 315-200 MG-UNIT tablet, Take 1 tablet by mouth every morning., Disp: , Rfl:    Cholecalciferol (VITAMIN D-1000 MAX ST) 25 MCG (1000 UT) tablet, Take by mouth., Disp: , Rfl:    cyanocobalamin 100 MCG tablet, , Disp: , Rfl:    dorzolamide-timolol (COSOPT) 22.3-6.8 MG/ML ophthalmic solution, Apply to eye., Disp: , Rfl:     ferrous sulfate 325 (65 FE) MG tablet, Take by mouth., Disp: , Rfl:    hydrALAZINE (APRESOLINE) 25 MG tablet, TAKE 1 TABLET BY MOUTH THREE TIMES DAILY AS NEEDED FOR SYSTOLIC BLOOD PRESSURE, Disp: 270 tablet, Rfl: 0   hydrocortisone 2.5 % cream, Apply topically., Disp: , Rfl:    ibuprofen (ADVIL) 200 MG tablet, Take by mouth., Disp: , Rfl:    ketoconazole (NIZORAL) 2 % cream, Apply 1 Application topically daily., Disp: 30 g, Rfl: 0   Omega-3 Fatty Acids (FISH OIL) 1000 MG CAPS, Take 1 capsule by mouth daily., Disp: , Rfl:    OPTIVE 0.5-0.9 % ophthalmic solution, SMARTSIG:1 Drop(s) In Eye(s) PRN, Disp: , Rfl:    promethazine-dextromethorphan (PROMETHAZINE-DM) 6.25-15 MG/5ML syrup, Take 5 mLs by mouth 4 (four) times daily as needed for cough., Disp: 118 mL, Rfl: 0   rosuvastatin (CRESTOR) 5 MG tablet, Take 1 tablet (5 mg total) by mouth daily., Disp: 90 tablet, Rfl: 1   Secukinumab (COSENTYX) 150 MG/ML SOSY, Inject 150 mg into the skin every 30 (thirty) days., Disp: , Rfl:    Medications ordered in this encounter:  Meds ordered this encounter  Medications   nirmatrelvir/ritonavir (PAXLOVID) 20 x 150 MG & 10 x 100MG  TABS    Sig: Take 3 tablets by mouth 2 (two) times daily for 5 days. (Take nirmatrelvir 150 mg two tablets twice daily for 5 days and ritonavir 100 mg one  tablet twice daily for 5 days) Patient GFR is 72 10/23    Dispense:  30 tablet    Refill:  0    Order Specific Question:   Supervising Provider    Answer:   Merrilee Jansky [[id number]]     *If you need refills on other medications prior to your next appointment, please contact your pharmacy*  Follow-Up: Call back or seek an in-person evaluation if the symptoms worsen or if the condition fails to improve as anticipated.   Virtual Care [phone]  Other Instructions  Please keep well-hydrated and get plenty of rest. Start a saline nasal rinse to flush out your nasal passages. You can use plain Mucinex to help  thin congestion. If you have a humidifier, running in the bedroom at night. I want you to start OTC vitamin D3 1000 units daily, vitamin C 1000 mg daily, and a zinc supplement. Please take prescribed medications as directed.  You have been enrolled in a MyChart symptom monitoring program. Please answer these questions daily so we can keep track of how you are doing.  You were to quarantine for 5 days from onset of your symptoms.  After day 5, if you have had no fever and you are feeling better, you can end quarantine but need to mask for an additional 5 days. After day 5 if you have a fever or are having significant symptoms, please quarantine for full 10 days.  If you note any worsening of symptoms, any significant shortness of breath or any chest pain, please seek ER evaluation ASAP.  Please do not delay care!  COVID-19: What to Do if You Are Sick If you test positive and are an older adult or someone who is at high risk of getting very sick from COVID-19, treatment may be available. Contact a healthcare provider right away after a positive test to determine if you are eligible, even if your symptoms are mild right now. You can also visit a Test to Treat location and, if eligible, receive a prescription from a provider. Don't delay: Treatment must be started within the first few days to be effective. If you have a fever, cough, or other symptoms, you might have COVID-19. Most people have mild illness and are able to recover at home. If you are sick: Keep track of your symptoms. If you have an emergency warning sign (including trouble breathing), call 911. Steps to help prevent the spread of COVID-19 if you are sick If you are sick with COVID-19 or think you might have COVID-19, follow the steps below to care for yourself and to help protect other people in your home and community. Stay home except to get medical care Stay home. Most people with COVID-19 have mild illness and can recover at  home without medical care. Do not leave your home, except to get medical care. Do not visit public areas and do not go to places where you are unable to wear a mask. Take care of yourself. Get rest and stay hydrated. Take over-the-counter medicines, such as acetaminophen, to help you feel better. Stay in touch with your doctor. Call before you get medical care. Be sure to get care if you have trouble breathing, or have any other emergency warning signs, or if you think it is an emergency. Avoid public transportation, ride-sharing, or taxis if possible. Get tested If you have symptoms of COVID-19, get tested. While waiting for test results, stay away from others, including staying apart from those  living in your household. Get tested as soon as possible after your symptoms start. Treatments may be available for people with COVID-19 who are at risk for becoming very sick. Don't delay: Treatment must be started early to be effective--some treatments must begin within 5 days of your first symptoms. Contact your healthcare provider right away if your test result is positive to determine if you are eligible. Self-tests are one of several options for testing for the virus that causes COVID-19 and may be more convenient than laboratory-based tests and point-of-care tests. Ask your healthcare provider or your local health department if you need help interpreting your test results. You can visit your state, tribal, local, and territorial health department's website to look for the latest local information on testing sites. Separate yourself from other people As much as possible, stay in a specific room and away from other people and pets in your home. If possible, you should use a separate bathroom. If you need to be around other people or animals in or outside of the home, wear a well-fitting mask. Tell your close contacts that they may have been exposed to COVID-19. An infected person can spread COVID-19 starting  48 hours (or 2 days) before the person has any symptoms or tests positive. By letting your close contacts know they may have been exposed to COVID-19, you are helping to protect everyone. See COVID-19 and Animals if you have questions about pets. If you are diagnosed with COVID-19, someone from the health department may call you. Answer the call to slow the spread. Monitor your symptoms Symptoms of COVID-19 include fever, cough, or other symptoms. Follow care instructions from your healthcare provider and local health department. Your local health authorities may give instructions on checking your symptoms and reporting information. When to seek emergency medical attention Look for emergency warning signs* for COVID-19. If someone is showing any of these signs, seek emergency medical care immediately: Trouble breathing Persistent pain or pressure in the chest New confusion Inability to wake or stay awake Pale, gray, or blue-colored skin, lips, or nail beds, depending on skin tone *This list is not all possible symptoms. Please call your medical provider for any other symptoms that are severe or concerning to you. Call 911 or call ahead to your local emergency facility: Notify the operator that you are seeking care for someone who has or may have COVID-19. Call ahead before visiting your doctor Call ahead. Many medical visits for routine care are being postponed or done by phone or telemedicine. If you have a medical appointment that cannot be postponed, call your doctor's office, and tell them you have or may have COVID-19. This will help the office protect themselves and other patients. If you are sick, wear a well-fitting mask You should wear a mask if you must be around other people or animals, including pets (even at home). Wear a mask with the best fit, protection, and comfort for you. You don't need to wear the mask if you are alone. If you can't put on a mask (because of trouble  breathing, for example), cover your coughs and sneezes in some other way. Try to stay at least 6 feet away from other people. This will help protect the people around you. Masks should not be placed on young children under age 11 years, anyone who has trouble breathing, or anyone who is not able to remove the mask without help. Cover your coughs and sneezes Cover your mouth and nose with a tissue  when you cough or sneeze. Throw away used tissues in a lined trash can. Immediately wash your hands with soap and water for at least 20 seconds. If soap and water are not available, clean your hands with an alcohol-based hand sanitizer that contains at least 60% alcohol. Clean your hands often Wash your hands often with soap and water for at least 20 seconds. This is especially important after blowing your nose, coughing, or sneezing; going to the bathroom; and before eating or preparing food. Use hand sanitizer if soap and water are not available. Use an alcohol-based hand sanitizer with at least 60% alcohol, covering all surfaces of your hands and rubbing them together until they feel dry. Soap and water are the best option, especially if hands are visibly dirty. Avoid touching your eyes, nose, and mouth with unwashed hands. Handwashing Tips Avoid sharing personal household items Do not share dishes, drinking glasses, cups, eating utensils, towels, or bedding with other people in your home. Wash these items thoroughly after using them with soap and water or put in the dishwasher. Clean surfaces in your home regularly Clean and disinfect high-touch surfaces (for example, doorknobs, tables, handles, light switches, and countertops) in your "sick room" and bathroom. In shared spaces, you should clean and disinfect surfaces and items after each use by the person who is ill. If you are sick and cannot clean, a caregiver or other person should only clean and disinfect the area around you (such as your bedroom  and bathroom) on an as needed basis. Your caregiver/other person should wait as long as possible (at least several hours) and wear a mask before entering, cleaning, and disinfecting shared spaces that you use. Clean and disinfect areas that may have blood, stool, or body fluids on them. Use household cleaners and disinfectants. Clean visible dirty surfaces with household cleaners containing soap or detergent. Then, use a household disinfectant. Use a product from [employer] List N: Disinfectants for Coronavirus (EXBMW-41). Be sure to follow the instructions on the label to ensure safe and effective use of the product. Many products recommend keeping the surface wet with a disinfectant for a certain period of time (look at "contact time" on the product label). You may also need to wear personal protective equipment, such as gloves, depending on the directions on the product label. Immediately after disinfecting, wash your hands with soap and water for 20 seconds. For completed guidance on cleaning and disinfecting your home, visit Complete Disinfection Guidance. Take steps to improve ventilation at home Improve ventilation (air flow) at home to help prevent from spreading COVID-19 to other people in your household. Clear out COVID-19 virus particles in the air by opening windows, using air filters, and turning on fans in your home. Use this interactive tool to learn how to improve air flow in your home. When you can be around others after being sick with COVID-19 Deciding when you can be around others is different for different situations. Find out when you can safely end home isolation. For any additional questions about your care, contact your healthcare provider or state or local health department. 08/21/2020 Content source: [facility] for Immunization and Respiratory Diseases ([facility]), Division of Viral Diseases This information is not intended to replace advice given to you by your health care  provider. Make sure you discuss any questions you have with your health care provider. Document Revised: 10/04/2020 Document Reviewed: 10/04/2020 Elsevier Patient Education  2022 [employer].      If you have been  instructed to have an in-person evaluation today at a local Urgent Care facility, please use the link below. It will take you to a list of all of our available [facility], including address, phone number and hours of operation. Please do not delay care.  [facility]  If you or a family member do not have a primary care provider, use the link below to schedule a visit and establish care. When you choose a [facility] primary care physician or advanced practice provider, you gain a long-term partner in health. Find a Primary Care Provider  Learn more about [facility]'s in-office and virtual care options: [facility]

## 2022-06-24 ENCOUNTER — Ambulatory Visit (INDEPENDENT_AMBULATORY_CARE_PROVIDER_SITE_OTHER): Payer: Medicare Other | Admitting: Nurse Practitioner

## 2022-06-27 ENCOUNTER — Ambulatory Visit (INDEPENDENT_AMBULATORY_CARE_PROVIDER_SITE_OTHER): Payer: Medicare Other | Admitting: Nurse Practitioner

## 2022-07-01 ENCOUNTER — Ambulatory Visit (INDEPENDENT_AMBULATORY_CARE_PROVIDER_SITE_OTHER): Payer: Medicare Other | Admitting: Nurse Practitioner

## 2022-07-07 ENCOUNTER — Encounter: Payer: Self-pay | Admitting: Internal Medicine

## 2022-07-07 ENCOUNTER — Ambulatory Visit (INDEPENDENT_AMBULATORY_CARE_PROVIDER_SITE_OTHER): Payer: Medicare HMO | Admitting: Internal Medicine

## 2022-07-07 VITALS — BP 124/62 | HR 69 | Temp 96.9°F | Wt 167.0 lb

## 2022-07-07 DIAGNOSIS — W010XXA Fall on same level from slipping, tripping and stumbling without subsequent striking against object, initial encounter: Secondary | ICD-10-CM

## 2022-07-07 DIAGNOSIS — S300XXA Contusion of lower back and pelvis, initial encounter: Secondary | ICD-10-CM | POA: Diagnosis not present

## 2022-07-07 NOTE — Progress Notes (Signed)
Subjective:    Patient ID: Anita Mathews, female    DOB: [birth year], 72 y.o.   MRN: [id number]  HPI  Patient presents to clinic today with complaint of a fall.  This occurred 10 days ago. She reports she was squatting down to pick something up out of her gravel driveway when she lost her balance and fell backwards. She reports a bruise to the left buttocks.  She denies any overt tailbone pain.  She has not had any difficulty ambulating.  Review of Systems     Past Medical History:  Diagnosis Date   Arthritis    Glaucoma     Current Outpatient Medications  Medication Sig Dispense Refill   acetaminophen (TYLENOL) 500 MG tablet Take by mouth.     albuterol (VENTOLIN HFA) 108 (90 Base) MCG/ACT inhaler Inhale 2 puffs into the lungs every 6 (six) hours as needed for wheezing or shortness of breath. 8 g 0   alendronate (FOSAMAX) 70 MG tablet Take 1 tablet (70 mg total) by mouth once a week. Take with a full glass of water on an empty stomach. 12 tablet 1   amLODipine (NORVASC) 2.5 MG tablet TAKE 1 TABLET(2.5 MG) BY MOUTH DAILY 90 tablet 0   Biotin 1 MG CAPS Take 1 capsule by mouth daily.     calcium citrate-vitamin D (CITRACAL+D) 315-200 MG-UNIT tablet Take 1 tablet by mouth every morning.     Cholecalciferol (VITAMIN D-1000 MAX ST) 25 MCG (1000 UT) tablet Take by mouth.     cyanocobalamin 100 MCG tablet      dorzolamide-timolol (COSOPT) 22.3-6.8 MG/ML ophthalmic solution Apply to eye.     ferrous sulfate 325 (65 FE) MG tablet Take by mouth.     hydrALAZINE (APRESOLINE) 25 MG tablet TAKE 1 TABLET BY MOUTH THREE TIMES DAILY AS NEEDED FOR SYSTOLIC BLOOD PRESSURE 628 tablet 0   hydrocortisone 2.5 % cream Apply topically.     ibuprofen (ADVIL) 200 MG tablet Take by mouth.     ketoconazole (NIZORAL) 2 % cream Apply 1 Application topically daily. 30 g 0   Omega-3 Fatty Acids (FISH OIL) 1000 MG CAPS Take 1 capsule by mouth daily.     OPTIVE 0.5-0.9 % ophthalmic solution SMARTSIG:1 Drop(s) In  Eye(s) PRN     promethazine-dextromethorphan (PROMETHAZINE-DM) 6.25-15 MG/5ML syrup Take 5 mLs by mouth 4 (four) times daily as needed for cough. 118 mL 0   rosuvastatin (CRESTOR) 5 MG tablet Take 1 tablet (5 mg total) by mouth daily. 90 tablet 1   Secukinumab (COSENTYX) 150 MG/ML SOSY Inject 150 mg into the skin every 30 (thirty) days.     No current facility-administered medications for this visit.    No Known Allergies  Family History  Problem Relation Age of Onset   Breast cancer Mother 25   Breast cancer Paternal Grandmother     Social History   Socioeconomic History   Marital status: Divorced    Spouse name: Not on file   Number of children: Not on file   Years of education: Not on file   Highest education level: Not on file  Occupational History   Not on file  Tobacco Use   Smoking status: Never   Smokeless tobacco: Never  Vaping Use   Vaping Use: Never used  Substance and Sexual Activity   Alcohol use: Yes    Comment: occasional   Drug use: Never   Sexual activity: Not on file  Other Topics Concern   Not on  file  Social History Narrative   Not on file   Social Determinants of Health   Financial Resource Strain: Low Risk  (04/04/2022)   Overall Financial Resource Strain (CARDIA)    Difficulty of Paying Living Expenses: Not hard at all  Food Insecurity: No Food Insecurity (04/04/2022)   Hunger Vital Sign    Worried About Running Out of Food in the Last Year: Never true    Ran Out of Food in the Last Year: Never true  Transportation Needs: No Transportation Needs (04/04/2022)   PRAPARE - Hydrologist (Medical): No    Lack of Transportation (Non-Medical): No  Physical Activity: Insufficiently Active (04/04/2022)   Exercise Vital Sign    Days of Exercise per Week: 3 days    Minutes of Exercise per Session: 30 min  Stress: No Stress Concern Present (04/04/2022)   [place]    Feeling of Stress : Only a little  Social Connections: Moderately Isolated (04/04/2022)   Social Connection and Isolation Panel [NHANES]    Frequency of Communication with Friends and Family: Twice a week    Frequency of Social Gatherings with Friends and Family: More than three times a week    Attends Religious Services: More than 4 times per year    Active Member of [employer] or Organizations: No    Attends Archivist Meetings: Never    Marital Status: Divorced  Human resources officer Violence: Not At Risk (04/04/2022)   Humiliation, Afraid, Rape, and Kick questionnaire    Fear of Current or Ex-Partner: No    Emotionally Abused: No    Physically Abused: No    Sexually Abused: No     Constitutional: Denies fever, malaise, fatigue, headache or abrupt weight changes.  Respiratory: Denies difficulty breathing, shortness of breath, cough or sputum production.   Cardiovascular: Denies chest pain, chest tightness, palpitations or swelling in the hands or feet.  Musculoskeletal: Denies decrease in range of motion, difficulty with gait, muscle pain or joint pain and swelling.  Skin: Patient reports bruise of buttocks.  Denies redness, rashes, lesions or ulcercations.    No other specific complaints in a complete review of systems (except as listed in HPI above).  Objective:   Physical Exam   BP 124/62 (BP Location: Right Arm, Patient Position: Sitting, Cuff Size: Normal)   Pulse 69   Temp (!) 96.9 F (36.1 C) (Temporal)   Wt 167 lb (75.8 kg)   SpO2 100%   BMI 28.89 kg/m   Wt Readings from Last 3 Encounters:  04/04/22 165 lb 3.2 oz (74.9 kg)  03/25/22 165 lb (74.8 kg)  03/20/22 164 lb (74.4 kg)    General: Appears her stated age, overweight, in NAD. Skin: Multiple healing bruises noted over the left buttock. Cardiovascular: Normal rate. Pulmonary/Chest: Normal effort. Musculoskeletal: No bony tenderness noted over the lumbar spine, sacrum or coccyx.  No  difficulty with gait.  Neurological: Alert and oriented.     BMET    Component Value Date/Time   NA 140 03/20/2022 1343   K 4.0 03/20/2022 1343   CL 104 03/20/2022 1343   CO2 27 03/20/2022 1343   GLUCOSE 119 03/20/2022 1343   BUN 14 03/20/2022 1343   CREATININE 0.86 03/20/2022 1343   CALCIUM 10.0 03/20/2022 1343   GFRNONAA >60 06/06/2021 2244   GFRAA >60 01/24/2018 1056    Lipid Panel     Component Value Date/Time  CHOL 170 03/20/2022 1343   TRIG 142 03/20/2022 1343   HDL 72 03/20/2022 1343   CHOLHDL 2.4 03/20/2022 1343   LDLCALC 75 03/20/2022 1343    CBC    Component Value Date/Time   WBC 6.8 03/20/2022 1343   RBC 4.34 03/20/2022 1343   HGB 13.1 03/20/2022 1343   HCT 38.6 03/20/2022 1343   PLT 321 03/20/2022 1343   MCV 88.9 03/20/2022 1343   MCH 30.2 03/20/2022 1343   MCHC 33.9 03/20/2022 1343   RDW 12.4 03/20/2022 1343   LYMPHSABS 2.2 01/24/2018 1056   MONOABS 0.9 01/24/2018 1056   EOSABS 0.1 01/24/2018 1056   BASOSABS 0.1 01/24/2018 1056    Hgb A1C No results found for: "HGBA1C"         Assessment & Plan:  Contusion of Buttock status post Fall:  No concern for tailbone fracture based on exam Reassured her that the bruises were healing and did not appear to be hematomas Will monitor for now  RTC in 2 months for annual exam Webb Silversmith, NP

## 2022-07-07 NOTE — Patient Instructions (Signed)
Contusion A contusion is a deep bruise. Contusions are the result of a blunt injury to tissues and muscle fibers under the skin. The injury causes bleeding under the skin. The skin over the contusion may turn blue, purple, or yellow. Minor injuries will give you a painless contusion, but more severe injuries cause contusions that can stay painful and swollen for a few weeks. Follow these instructions at home: Pay attention to any changes in your symptoms. Let your health care provider know about them. Take these actions to relieve your pain. Managing pain, stiffness, and swelling  Use resting, icing, applying pressure (compression), and raising (elevating) the injured area. This is often called the RICE method. Rest the injured area. Return to your normal activities as told by your health care provider. Ask your health care provider what activities are safe for you. If directed, put ice on the injured area. To do this: Put ice in a plastic bag. Place a towel between your skin and the bag. Leave the ice on for 20 minutes, 2-3 times a day. If your skin turns bright red, remove the ice right away to prevent skin damage. The risk of skin damage is higher if you cannot feel pain, heat, or cold. If directed, apply light compression to the injured area using an elastic bandage. Make sure the bandage is not wrapped too tightly. Remove and reapply the bandage as directed by your health care provider. If possible, elevate the injured area above the level of your heart while you are sitting or lying down. General instructions Take over-the-counter and prescription medicines only as told by your health care provider. Keep all follow-up visits. Your health care provider may want to see how your contusion is healing with treatment. Contact a health care provider if: Your symptoms do not improve after several days of treatment. Your symptoms get worse. You have difficulty moving the injured area. Get help  right away if: You have severe pain. You have numbness in a hand or foot. Your hand or foot turns pale or cold. This information is not intended to replace advice given to you by your health care provider. Make sure you discuss any questions you have with your health care provider. Document Revised: 11/04/2021 Document Reviewed: 11/04/2021 Elsevier Patient Education  2023 Elsevier Inc.  

## 2022-07-10 ENCOUNTER — Other Ambulatory Visit: Payer: Self-pay | Admitting: Internal Medicine

## 2022-07-10 DIAGNOSIS — Z1231 Encounter for screening mammogram for malignant neoplasm of breast: Secondary | ICD-10-CM

## 2022-07-15 ENCOUNTER — Ambulatory Visit (INDEPENDENT_AMBULATORY_CARE_PROVIDER_SITE_OTHER): Payer: Medicare HMO | Admitting: Nurse Practitioner

## 2022-07-15 ENCOUNTER — Encounter (INDEPENDENT_AMBULATORY_CARE_PROVIDER_SITE_OTHER): Payer: Self-pay | Admitting: Nurse Practitioner

## 2022-07-15 ENCOUNTER — Ambulatory Visit (INDEPENDENT_AMBULATORY_CARE_PROVIDER_SITE_OTHER): Payer: Medicare Other | Admitting: Nurse Practitioner

## 2022-07-15 VITALS — BP 130/72 | HR 50 | Ht 63.75 in | Wt 165.0 lb

## 2022-07-15 DIAGNOSIS — I10 Essential (primary) hypertension: Secondary | ICD-10-CM | POA: Diagnosis not present

## 2022-07-15 DIAGNOSIS — E782 Mixed hyperlipidemia: Secondary | ICD-10-CM

## 2022-07-15 DIAGNOSIS — I83813 Varicose veins of bilateral lower extremities with pain: Secondary | ICD-10-CM

## 2022-07-15 NOTE — Progress Notes (Signed)
Subjective:    Patient ID: Anita Mathews, female    DOB: 03/29/1951, 72 y.o.   MRN: [id number] No chief complaint on file.   The patient returns to the office for followup evaluation regarding leg swelling and varicosities.  The swelling has improved somewhat a bit and the pain associated with swelling has decreased substantially. There have not been any interval development of a ulcerations or wounds.   Since the previous visit the patient has been wearing graduated compression stockings and notes some tenderness under the areas of some spider veins but typically this is not intolerable for her.  She notes that her swelling is much better controlled when she uses her knee-high compression socks.   The patient also states elevation during the day and exercise is being done too.      Review of Systems  Cardiovascular:  Positive for leg swelling.  All other systems reviewed and are negative.      Objective:   Physical Exam Vitals reviewed.  HENT:     Head: Normocephalic.  Cardiovascular:     Rate and Rhythm: Normal rate.     Pulses: Normal pulses.  Pulmonary:     Effort: Pulmonary effort is normal.  Skin:    General: Skin is warm and dry.  Neurological:     Mental Status: She is alert and oriented to person, place, and time.  Psychiatric:        Mood and Affect: Mood normal.        Behavior: Behavior normal.        Thought Content: Thought content normal.        Judgment: Judgment normal.     There were no vitals taken for this visit.  Past Medical History:  Diagnosis Date   Arthritis    Glaucoma     Social History   Socioeconomic History   Marital status: Divorced    Spouse name: Not on file   Number of children: Not on file   Years of education: Not on file   Highest education level: Not on file  Occupational History   Not on file  Tobacco Use   Smoking status: Never   Smokeless tobacco: Never  Vaping Use   Vaping Use: Never used  Substance and  Sexual Activity   Alcohol use: Yes    Comment: occasional   Drug use: Never   Sexual activity: Not on file  Other Topics Concern   Not on file  Social History Narrative   Not on file   Social Determinants of Health   Financial Resource Strain: Low Risk  (04/04/2022)   Overall Financial Resource Strain (CARDIA)    Difficulty of Paying Living Expenses: Not hard at all  Food Insecurity: No Food Insecurity (04/04/2022)   Hunger Vital Sign    Worried About Running Out of Food in the Last Year: Never true    [place] in the Last Year: Never true  Transportation Needs: No Transportation Needs (04/04/2022)   PRAPARE - Hydrologist (Medical): No    Lack of Transportation (Non-Medical): No  Physical Activity: Insufficiently Active (04/04/2022)   Exercise Vital Sign    Days of Exercise per Week: 3 days    Minutes of Exercise per Session: 30 min  Stress: No Stress Concern Present (04/04/2022)   [place]    Feeling of Stress : Only a little  Social Connections: Moderately Isolated (  04/04/2022)   Social Connection and Isolation Panel [NHANES]    Frequency of Communication with Friends and Family: Twice a week    Frequency of Social Gatherings with Friends and Family: More than three times a week    Attends Religious Services: More than 4 times per year    Active Member of Clubs or Organizations: No    Attends Archivist Meetings: Never    Marital Status: Divorced  Human resources officer Violence: Not At Risk (04/04/2022)   Humiliation, Afraid, Rape, and Kick questionnaire    Fear of Current or Ex-Partner: No    Emotionally Abused: No    Physically Abused: No    Sexually Abused: No    Past Surgical History:  Procedure Laterality Date   CESAREAN SECTION     EYE SURGERY     SHOULDER SURGERY Left     Family History  Problem Relation Age of Onset   Breast cancer Mother 61   Breast  cancer Paternal Grandmother     No Known Allergies     Latest Ref Rng & Units 03/20/2022    1:43 PM 06/06/2021   10:44 PM 03/20/2021    9:34 AM  CBC  WBC 3.8 - 10.8 Thousand/uL 6.8  6.5  5.5   Hemoglobin 11.7 - 15.5 g/dL 13.1  12.7  13.3   Hematocrit 35.0 - 45.0 % 38.6  37.8  40.0   Platelets 140 - 400 Thousand/uL 321  286  304       CMP     Component Value Date/Time   NA 140 03/20/2022 1343   K 4.0 03/20/2022 1343   CL 104 03/20/2022 1343   CO2 27 03/20/2022 1343   GLUCOSE 119 03/20/2022 1343   BUN 14 03/20/2022 1343   CREATININE 0.86 03/20/2022 1343   CALCIUM 10.0 03/20/2022 1343   PROT 7.6 03/20/2022 1343   ALBUMIN 3.9 01/24/2018 1056   AST 20 03/20/2022 1343   ALT 20 03/20/2022 1343   ALKPHOS 56 01/24/2018 1056   BILITOT 0.4 03/20/2022 1343   GFRNONAA >60 06/06/2021 2244   GFRAA >60 01/24/2018 1056     No results found.     Assessment & Plan:   1. Varicose veins of both lower extremities with pain She continues to have some superficial varicosities more in the popliteal space bilaterally.  However at this time the issues are not significant enough to proceed with intervention.  Patient is advised to continue with use of conservative therapy including medical grade compression, elevation and activity.  Will see the patient in 1 year or sooner if she begins to have worsening issues.  2. Primary hypertension Continue antihypertensive medications as already ordered, these medications have been reviewed and there are no changes at this time.  3. Mixed hyperlipidemia Continue statin as ordered and reviewed, no changes at this time   Current Outpatient Medications on File Prior to Visit  Medication Sig Dispense Refill   acetaminophen (TYLENOL) 500 MG tablet Take by mouth.     albuterol (VENTOLIN HFA) 108 (90 Base) MCG/ACT inhaler Inhale 2 puffs into the lungs every 6 (six) hours as needed for wheezing or shortness of breath. 8 g 0   alendronate (FOSAMAX) 70 MG  tablet Take 1 tablet (70 mg total) by mouth once a week. Take with a full glass of water on an empty stomach. 12 tablet 1   amLODipine (NORVASC) 2.5 MG tablet TAKE 1 TABLET(2.5 MG) BY MOUTH DAILY 90 tablet 0   Biotin  1 MG CAPS Take 1 capsule by mouth daily.     calcium citrate-vitamin D (CITRACAL+D) 315-200 MG-UNIT tablet Take 1 tablet by mouth every morning.     Cholecalciferol (VITAMIN D-1000 MAX ST) 25 MCG (1000 UT) tablet Take by mouth.     cyanocobalamin 100 MCG tablet      dorzolamide-timolol (COSOPT) 22.3-6.8 MG/ML ophthalmic solution Apply to eye.     ferrous sulfate 325 (65 FE) MG tablet Take by mouth.     hydrALAZINE (APRESOLINE) 25 MG tablet TAKE 1 TABLET BY MOUTH THREE TIMES DAILY AS NEEDED FOR SYSTOLIC BLOOD PRESSURE [id number] tablet 0   hydrocortisone 2.5 % cream Apply topically.     ibuprofen (ADVIL) 200 MG tablet Take by mouth.     ketoconazole (NIZORAL) 2 % cream Apply 1 Application topically daily. 30 g 0   Omega-3 Fatty Acids (FISH OIL) 1000 MG CAPS Take 1 capsule by mouth daily.     OPTIVE 0.5-0.9 % ophthalmic solution SMARTSIG:1 Drop(s) In Eye(s) PRN     rosuvastatin (CRESTOR) 5 MG tablet Take 1 tablet (5 mg total) by mouth daily. 90 tablet 1   Secukinumab (COSENTYX) 150 MG/ML SOSY Inject 150 mg into the skin every 30 (thirty) days.     No current facility-administered medications on file prior to visit.    There are no Patient Instructions on file for this visit. No follow-ups on file.   Kris Hartmann, NP

## 2022-07-21 ENCOUNTER — Telehealth: Payer: Medicare HMO | Admitting: Physician Assistant

## 2022-07-21 ENCOUNTER — Telehealth: Payer: Medicare HMO

## 2022-07-21 ENCOUNTER — Ambulatory Visit: Payer: Self-pay

## 2022-07-21 ENCOUNTER — Encounter: Payer: Self-pay | Admitting: Family Medicine

## 2022-07-21 ENCOUNTER — Telehealth (INDEPENDENT_AMBULATORY_CARE_PROVIDER_SITE_OTHER): Payer: Medicare HMO | Admitting: Family Medicine

## 2022-07-21 VITALS — Ht 63.75 in | Wt 165.0 lb

## 2022-07-21 DIAGNOSIS — J069 Acute upper respiratory infection, unspecified: Secondary | ICD-10-CM

## 2022-07-21 DIAGNOSIS — J111 Influenza due to unidentified influenza virus with other respiratory manifestations: Secondary | ICD-10-CM

## 2022-07-21 DIAGNOSIS — R509 Fever, unspecified: Secondary | ICD-10-CM

## 2022-07-21 MED ORDER — OSELTAMIVIR PHOSPHATE 75 MG PO CAPS: 75.0000 mg | ORAL_CAPSULE | Freq: Two times a day (BID) | ORAL | 0 refills | Status: DC

## 2022-07-21 MED ORDER — PROMETHAZINE-DM 6.25-15 MG/5ML PO SYRP: 5.0000 mL | ORAL_SOLUTION | Freq: Four times a day (QID) | ORAL | 0 refills | Status: DC | PRN

## 2022-07-21 MED ORDER — FLUTICASONE PROPIONATE 50 MCG/ACT NA SUSP: 2.0000 | Freq: Every day | NASAL | 2 refills | Status: DC

## 2022-07-21 NOTE — Progress Notes (Signed)
Subjective:    Patient ID: Anita Mathews, female    DOB: 01-08-1951, 72 y.o.   MRN: [id number]  Anita Mathews is a 72 y.o. female presenting on 07/21/2022 for Fever and Nasal Congestion  Patient presents for a same day appointment.  PCP. Engineer, manufacturing / Telehealth Encounter - Video Visit via MyChart The purpose of this virtual visit is to provide medical care while limiting exposure to the novel coronavirus (COVID19) for both patient and office staff.  Consent was obtained for remote visit:  Yes.   Answered questions that patient had about telehealth interaction:  Yes.   I discussed the limitations, risks, security and privacy concerns of performing an evaluation and management service by video/telephone. I also discussed with the patient that there may be a patient responsible charge related to this service. The patient expressed understanding and agreed to proceed.  Patient Location: Home Provider Location: [facility] (Office)  Participants in virtual visit: - Patient: Anita Mathews - CMA: Orinda Kenner, [place] - Provider: Dr Parks Ranger   HPI  Suspected Influenza Viral Syndrome She went to work and felt fatigued and achy, congestion, aching Fever up to 101.4 up to 103 recently COVID test 07/16/22, negative Taking OTC Mucinex generic Daughter had similar symptoms, ran 5 days for URI similar symptoms Denies any nausea vomiting diarrhea   Health Maintenance: Flu Vaccine updated COVID-19 vaccine updated     07/07/2022    3:25 PM 04/04/2022    3:45 PM 09/05/2021    2:59 PM  Depression screen PHQ 2/9  Decreased Interest 0 0 0  Down, Depressed, Hopeless 0 0 0  PHQ - 2 Score 0 0 0  Altered sleeping  0 1  Tired, decreased energy  0 0  Change in appetite  0 0  Feeling bad or failure about yourself   0 0  Trouble concentrating  0 0  Moving slowly or fidgety/restless  0 0  Suicidal thoughts  0 0  PHQ-9 Score  0 1  Difficult doing work/chores   Not difficult at all Not difficult at all    Social History   Tobacco Use   Smoking status: Never   Smokeless tobacco: Never  Vaping Use   Vaping Use: Never used  Substance Use Topics   Alcohol use: Yes    Comment: occasional   Drug use: Never    Review of Systems Per HPI unless specifically indicated above     Objective:    Ht 5' 3.75" (1.619 m)   Wt 165 lb (74.8 kg)   BMI 28.54 kg/m   Wt Readings from Last 3 Encounters:  07/21/22 165 lb (74.8 kg)  07/15/22 165 lb (74.8 kg)  07/07/22 167 lb (75.8 kg)    Physical Exam  Note examination was completely remotely via video observation objective data only  Gen - well-appearing, no acute distress or apparent pain, comfortable HEENT - eyes appear clear without discharge or redness Heart/Lungs - cannot examine virtually - observed no evidence of coughing or labored breathing. Abd - cannot examine virtually  Skin - face visible today- no rash Neuro - awake, alert, oriented Psych - not anxious appearing   Results for orders placed or performed in visit on 03/20/22  CBC  Result Value Ref Range   WBC 6.8 3.8 - 10.8 Thousand/uL   RBC 4.34 3.80 - 5.10 Million/uL   Hemoglobin 13.1 11.7 - 15.5 g/dL   HCT 38.6 35.0 - 45.0 %  MCV 88.9 80.0 - 100.0 fL   MCH 30.2 27.0 - 33.0 pg   MCHC 33.9 32.0 - 36.0 g/dL   RDW 12.4 11.0 - 15.0 %   Platelets 321 140 - 400 Thousand/uL   MPV 10.7 7.5 - 12.5 fL  Lipid panel  Result Value Ref Range   Cholesterol 170 <200 mg/dL   HDL 72 > OR = 50 mg/dL   Triglycerides 142 <150 mg/dL   LDL Cholesterol (Calc) 75 mg/dL (calc)   Total CHOL/HDL Ratio 2.4 <5.0 (calc)   Non-HDL Cholesterol (Calc) 98 <130 mg/dL (calc)  COMPLETE METABOLIC PANEL WITH GFR  Result Value Ref Range   Glucose, Bld 119 65 - 139 mg/dL   BUN 14 7 - 25 mg/dL   Creat 0.86 0.60 - 1.00 mg/dL   eGFR 72 > OR = 60 mL/min/1.87m   BUN/Creatinine Ratio SEE NOTE: 6 - 22 (calc)   Sodium 140 135 - 146 mmol/L   Potassium 4.0 3.5 -  5.3 mmol/L   Chloride 104 98 - 110 mmol/L   CO2 27 20 - 32 mmol/L   Calcium 10.0 8.6 - 10.4 mg/dL   Total Protein 7.6 6.1 - 8.1 g/dL   Albumin 4.7 3.6 - 5.1 g/dL   Globulin 2.9 1.9 - 3.7 g/dL (calc)   AG Ratio 1.6 1.0 - 2.5 (calc)   Total Bilirubin 0.4 0.2 - 1.2 mg/dL   Alkaline phosphatase (APISO) 101 37 - 153 U/L   AST 20 10 - 35 U/L   ALT 20 6 - 29 U/L      Assessment & Plan:   Problem List Items Addressed This Visit   None Visit Diagnoses     Influenza    -  Primary   Relevant Medications   oseltamivir (TAMIFLU) 75 MG capsule   promethazine-dextromethorphan (PROMETHAZINE-DM) 6.25-15 MG/5ML syrup   fluticasone (FLONASE) 50 MCG/ACT nasal spray       Clinically diagnosed influenza despite no test performed today w virtual visit COVID test was negative recently 2/14 Sick contact but not confirmed flu Tolerating PO - No other focal findings of infection today - S/p influenza vaccine this season  Plan: 1. Start Tamiflu 772mcapsules BID x 5 days 2. Supportive care as advised with NSAID / Tylenol PRN fever/myalgias, improve hydration, may take OTC Cold/Flu meds 3. Cough Syrup, Flonase course. 4. If not improving can [place] 3 days approx can order antibiotic / prednisone course as previously treated Return criteria given if significant worsening, consider post-influenza complications, otherwise follow-up if needed   Meds ordered this encounter  Medications   oseltamivir (TAMIFLU) 75 MG capsule    Sig: Take 1 capsule (75 mg total) by mouth 2 (two) times daily. For 5 days    Dispense:  10 capsule    Refill:  0   promethazine-dextromethorphan (PROMETHAZINE-DM) 6.25-15 MG/5ML syrup    Sig: Take 5 mLs by mouth 4 (four) times daily as needed for cough.    Dispense:  118 mL    Refill:  0   fluticasone (FLONASE) 50 MCG/ACT nasal spray    Sig: Place 2 sprays into both nostrils daily. Use for 4-6 weeks then stop and use seasonally or as needed.    Dispense:  16 g     Refill:  2      Follow up plan: Return if symptoms worsen or fail to improve.   Patient verbalizes understanding with the above medical recommendations including the limitation of remote medical advice.  Specific  follow-up and call-back criteria were given for patient to follow-up or seek medical care more urgently if needed.  Total duration of direct patient care provided via video conference: 10 minutes    Nobie Putnam, [facility] 07/21/2022, 4:29 PM

## 2022-07-21 NOTE — Patient Instructions (Addendum)
  Start Flu medication Tamiflu   - For symptom control (these are optional OTC medicines)      - Take Ibuprofen / Advil 400-624m every 6-8 hours as needed for fever / muscle aches, and may also take Tylenol 500-10047mper dose every 6-8 hours or 3 times a day, can alternate dosing     - May try OTC Mucinex up to 7-10 days then stop  If prescribed for you Start Cough Syrup Start nasal steroid Flonase 2 sprays in each nostril daily for 4-6 weeks, may repeat course seasonally or as needed  - Wash hands and cover cough very well to avoid spread of infection - Improve hydration with plenty of clear fluids  Call or message back 2-3 days we can order Antibiotic / Prednisone if not improving.    If significant worsening with poor fluid intake, worsening fever, difficulty breathing due to coughing, worsening body aches, weakness, or other more concerning symptoms difficulty breathing you can seek treatment at Emergency Department. Also if improved flu symptoms and then worsening days to week later with concerns for bronchitis, productive cough fever chills again we may need to check for possible pneumonia that can occur after the flu    Please schedule a Follow-up Appointment to: Return if symptoms worsen or fail to improve.  If you have any other questions or concerns, please feel free to call the office or send a message through [facility] may also schedule an earlier appointment if necessary.  Additionally, you may be receiving a survey about your experience at our office within a few days to 1 week by e-mail or mail. We value your feedback.  AlNobie PutnamDO [place]

## 2022-07-21 NOTE — Telephone Encounter (Signed)
Message from Anita Mathews sent at 07/21/2022  2:55 PM EST  Summary: Fever   Patient has a fever of 101.4 and also has the shakes. Patient says she feels cold and is wearing multiple layers of clothing and cannot keep warm. Please follow up with patient.         Chief Complaint: feveer Symptoms: sinus congestion, productive cough with wheezing Frequency: Today was sick on Tuesday but sx went away woke up feeling ill today Pertinent Negatives: Patient denies SOB, headache Disposition: []$ ED /[]$ Urgent Care (no appt availability in office) / [x]$ Appointment(In office/virtual)/ []$  [place]/ []$ Home Care/ []$ Refused Recommended Disposition /[]$ [facility] [place] [facility]/ []$  Follow-up with PCP Additional Notes: VV no openings with PCP made VV appt with Dr. Raliegh Ip  Reason for Disposition  Wheezing is present  Answer Assessment - Initial Assessment Questions 1. TEMPERATURE: "What is the most recent temperature?"  "How was it measured?"      101.4 recheck:  103.5 2. ONSET: "When did the fever start?"      today 3. CHILLS: "Do you have chills?" If yes: "How bad are they?"  (e.g., none, mild, moderate, severe)   - NONE: no chills   - MILD: feeling cold   - MODERATE: feeling very cold, some shivering (feels better under a thick blanket)   - SEVERE: feeling extremely cold with shaking chills (general body shaking, rigors; even under a thick blanket)      moderate 4. OTHER SYMPTOMS: "Do you have any other symptoms besides the fever?"  (e.g., abdomen pain, cough, diarrhea, earache, headache, sore throat, urination pain)     Fatigue, sinus pressure , cough with yellow to clear  5. CAUSE: If there are no symptoms, ask: "What do you think is causing the fever?"       6. CONTACTS: "Does anyone else in the family have an infection?"     With daughter last Sunday  7. TREATMENT: "What have you done so far to treat this fever?" (e.g., medications)     Tylenol this am and at 1200 8.  IMMUNOCOMPROMISE: "Do you have of the following: diabetes, HIV positive, splenectomy, cancer chemotherapy, chronic steroid treatment, transplant patient, etc."     *No Answer* 9. PREGNANCY: "Is there any chance you are pregnant?" "When was your last menstrual period?"     *No Answer* 10. TRAVEL: "Have you traveled out of the country in the last month?" (e.g., travel history, exposures)       N/a  Answer Assessment - Initial Assessment Questions 1. ONSET: "When did the cough begin?"      Tuesday  2. SEVERITY: "How bad is the cough today?"      Occasional cough 3. SPUTUM: "Describe the color of your sputum" (none, dry cough; clear, white, yellow, green)     yellow 4. HEMOPTYSIS: "Are you coughing up any blood?" If so ask: "How much?" (flecks, streaks, tablespoons, etc.)     no 5. DIFFICULTY BREATHING: "Are you having difficulty breathing?" If Yes, ask: "How bad is it?" (e.g., mild, moderate, severe)    - MILD: No SOB at rest, mild SOB with walking, speaks normally in sentences, can lie down, no retractions, pulse < 100.    - MODERATE: SOB at rest, SOB with minimal exertion and prefers to sit, cannot lie down flat, speaks in phrases, mild retractions, audible wheezing, pulse 100-120.    - SEVERE: Very SOB at rest, speaks in single words, struggling to breathe, sitting hunched forward, retractions, pulse >  120      no 6. FEVER: "Do you have a fever?" If Yes, ask: "What is your temperature, how was it measured, and when did it start?"     yes- 103.5 10. OTHER SYMPTOMS: "Do you have any other symptoms?" (e.g., runny nose, wheezing, chest pain)       Sinus pressure with yellow drainage and cough with yellow phlegm  Protocols used: Fever-A-AH, Cough - Acute Productive-A-AH

## 2022-07-21 NOTE — Progress Notes (Signed)
Because of high elevation in fever and substantial symptoms, I feel your condition warrants further evaluation and I recommend that you be seen in a face to face visit. You need a lung exam and possible x-ray to rule out a pneumonia, as well as COVID/RSV/influenza testing.  I see you scheduled a video visit as well for this evening. While we are happy to talk with you -- we will likely still have to send you in for evaluation giving such a high fever.    NOTE: There will be NO CHARGE for this eVisit   If you are having a true medical emergency please call 911.      For an urgent face to face visit, [place] has eight urgent care centers for your convenience:   NEW!! [facility] [place] [facility] [place] Get Driving Directions [id number] [address] [place], [place] [facility] [place] [facility] [place] Get Driving Directions [id number] [place], [place] [address]   [facility] [place] [facility]) Get Driving Directions [id number] [address], [place] [address]  [facility] [place] ([place]) Get Driving Directions [id number] [address] [place] [place],  [place]  [address]  [facility] [place] [facility] - at [place] Get Driving Directions  [phone] [address] [place],  [place] [address]   [facility] Get Driving Directions [id number] [place], [place], [place] [address]   [facility] Get Driving Directions  [id number] [address] [place], [place] [address]   [facility] [place] Get Driving Directions [id number] [address] [place], [place] [address]  Your MyChart E-visit questionnaire answers were reviewed by a board certified advanced clinical  practitioner to complete your personal care plan based on your specific symptoms.  Thank you for using e-Visits.

## 2022-07-25 ENCOUNTER — Telehealth (INDEPENDENT_AMBULATORY_CARE_PROVIDER_SITE_OTHER): Payer: Medicare HMO | Admitting: Internal Medicine

## 2022-07-25 ENCOUNTER — Encounter: Payer: Self-pay | Admitting: Internal Medicine

## 2022-07-25 DIAGNOSIS — R052 Subacute cough: Secondary | ICD-10-CM

## 2022-07-25 DIAGNOSIS — J111 Influenza due to unidentified influenza virus with other respiratory manifestations: Secondary | ICD-10-CM

## 2022-07-25 NOTE — Patient Instructions (Signed)

## 2022-07-25 NOTE — Progress Notes (Signed)
Virtual Visit via Video Note  I connected with Anita Mathews on 07/25/22 at 11:20 AM EST by a video enabled telemedicine application and verified that I am speaking with the correct person using two identifiers.  Location: Patient: Home Provider: Office  Persons participating in this video call: Webb Silversmith, NP and Sherlyn Lees   I discussed the limitations of evaluation and management by telemedicine and the availability of in person appointments. The patient expressed understanding and agreed to proceed.  History of Present Illness:  Patient reports persistent runny nose and cough. She is blowing clear, yellow mucous out of nose. The cough is mostly non productive. She currently denies headache, nasal congestion, ear pain, sore throat, shortness of breath, nausea, vomiting or diarrhea. She denies fever, chills or body ache. She was diagnosed with the flu on 2/19, treated with Tamiflu, Promethazine DM and Fluticasone nasal spray. She does feel like she is slowly getting better.    Past Medical History:  Diagnosis Date   Arthritis    Glaucoma     Current Outpatient Medications  Medication Sig Dispense Refill   acetaminophen (TYLENOL) 500 MG tablet Take by mouth.     alendronate (FOSAMAX) 70 MG tablet Take 1 tablet (70 mg total) by mouth once a week. Take with a full glass of water on an empty stomach. 12 tablet 1   amLODipine (NORVASC) 2.5 MG tablet TAKE 1 TABLET(2.5 MG) BY MOUTH DAILY 90 tablet 0   Biotin 1 MG CAPS Take 1 capsule by mouth daily.     calcium citrate-vitamin D (CITRACAL+D) 315-200 MG-UNIT tablet Take 1 tablet by mouth every morning.     Cholecalciferol (VITAMIN D-1000 MAX ST) 25 MCG (1000 UT) tablet Take by mouth.     cyanocobalamin 100 MCG tablet      dorzolamide-timolol (COSOPT) 22.3-6.8 MG/ML ophthalmic solution Apply to eye.     ferrous sulfate 325 (65 FE) MG tablet Take by mouth.     fluticasone (FLONASE) 50 MCG/ACT nasal spray Place 2 sprays into both  nostrils daily. Use for 4-6 weeks then stop and use seasonally or as needed. 16 g 2   hydrALAZINE (APRESOLINE) 25 MG tablet TAKE 1 TABLET BY MOUTH THREE TIMES DAILY AS NEEDED FOR SYSTOLIC BLOOD PRESSURE [id number] tablet 0   hydrocortisone 2.5 % cream Apply topically.     ibuprofen (ADVIL) 200 MG tablet Take by mouth.     ketoconazole (NIZORAL) 2 % cream Apply 1 Application topically daily. 30 g 0   Omega-3 Fatty Acids (FISH OIL) 1000 MG CAPS Take 1 capsule by mouth daily.     OPTIVE 0.5-0.9 % ophthalmic solution SMARTSIG:1 Drop(s) In Eye(s) PRN     oseltamivir (TAMIFLU) 75 MG capsule Take 1 capsule (75 mg total) by mouth 2 (two) times daily. For 5 days 10 capsule 0   promethazine-dextromethorphan (PROMETHAZINE-DM) 6.25-15 MG/5ML syrup Take 5 mLs by mouth 4 (four) times daily as needed for cough. 118 mL 0   rosuvastatin (CRESTOR) 5 MG tablet Take 1 tablet (5 mg total) by mouth daily. 90 tablet 1   Secukinumab (COSENTYX) 150 MG/ML SOSY Inject 150 mg into the skin every 30 (thirty) days.     No current facility-administered medications for this visit.    No Known Allergies  Family History  Problem Relation Age of Onset   Breast cancer Mother 40   Breast cancer Paternal Grandmother     Social History   Socioeconomic History   Marital status: Divorced    Spouse  name: Not on file   Number of children: Not on file   Years of education: Not on file   Highest education level: Not on file  Occupational History   Not on file  Tobacco Use   Smoking status: Never   Smokeless tobacco: Never  Vaping Use   Vaping Use: Never used  Substance and Sexual Activity   Alcohol use: Yes    Comment: occasional   Drug use: Never   Sexual activity: Not on file  Other Topics Concern   Not on file  Social History Narrative   Not on file   Social Determinants of Health   Financial Resource Strain: Low Risk  (04/04/2022)   Overall Financial Resource Strain (CARDIA)    Difficulty of Paying Living  Expenses: Not hard at all  Food Insecurity: No Food Insecurity (04/04/2022)   Hunger Vital Sign    Worried About Running Out of Food in the Last Year: Never true    [place] in the Last Year: Never true  Transportation Needs: No Transportation Needs (04/04/2022)   PRAPARE - Hydrologist (Medical): No    Lack of Transportation (Non-Medical): No  Physical Activity: Insufficiently Active (04/04/2022)   Exercise Vital Sign    Days of Exercise per Week: 3 days    Minutes of Exercise per Session: 30 min  Stress: No Stress Concern Present (04/04/2022)   [place]    Feeling of Stress : Only a little  Social Connections: Moderately Isolated (04/04/2022)   Social Connection and Isolation Panel [NHANES]    Frequency of Communication with Friends and Family: Twice a week    Frequency of Social Gatherings with Friends and Family: More than three times a week    Attends Religious Services: More than 4 times per year    Active Member of [employer] or Organizations: No    Attends Archivist Meetings: Never    Marital Status: Divorced  Human resources officer Violence: Not At Risk (04/04/2022)   Humiliation, Afraid, Rape, and Kick questionnaire    Fear of Current or Ex-Partner: No    Emotionally Abused: No    Physically Abused: No    Sexually Abused: No     Constitutional: Denies fever, malaise, fatigue, headache or abrupt weight changes.  HEENT: Pt reports runny nose. Denies eye pain, eye redness, ear pain, ringing in the ears, wax buildup, nasal congestion, bloody nose, or sore throat. Respiratory: Patient reports cough.  Denies difficulty breathing, shortness of breath, or sputum production.   Cardiovascular: Denies chest pain, chest tightness, palpitations or swelling in the hands or feet.  Gastrointestinal: Denies abdominal pain, bloating, constipation, diarrhea or blood in the stool.   No other  specific complaints in a complete review of systems (except as listed in HPI above).  Observations/Objective:   Wt Readings from Last 3 Encounters:  07/21/22 165 lb (74.8 kg)  07/15/22 165 lb (74.8 kg)  07/07/22 167 lb (75.8 kg)    General: Appears her stated age, well developed, well nourished in NAD. HEENT: Head: normal shape and size;  Nose: no congestion noted; Throat/Mouth: slight hoarseness noted Pulmonary/Chest: Normal effort. Dry cough noted. No respiratory distress.  Neurological: Alert and oriented.   BMET    Component Value Date/Time   NA 140 03/20/2022 1343   K 4.0 03/20/2022 1343   CL 104 03/20/2022 1343   CO2 27 03/20/2022 1343   GLUCOSE 119 03/20/2022  1343   BUN 14 03/20/2022 1343   CREATININE 0.86 03/20/2022 1343   CALCIUM 10.0 03/20/2022 1343   GFRNONAA >60 06/06/2021 2244   GFRAA >60 01/24/2018 1056    Lipid Panel     Component Value Date/Time   CHOL 170 03/20/2022 1343   TRIG 142 03/20/2022 1343   HDL 72 03/20/2022 1343   CHOLHDL 2.4 03/20/2022 1343   LDLCALC 75 03/20/2022 1343    CBC    Component Value Date/Time   WBC 6.8 03/20/2022 1343   RBC 4.34 03/20/2022 1343   HGB 13.1 03/20/2022 1343   HCT 38.6 03/20/2022 1343   PLT 321 03/20/2022 1343   MCV 88.9 03/20/2022 1343   MCH 30.2 03/20/2022 1343   MCHC 33.9 03/20/2022 1343   RDW 12.4 03/20/2022 1343   LYMPHSABS 2.2 01/24/2018 1056   MONOABS 0.9 01/24/2018 1056   EOSABS 0.1 01/24/2018 1056   BASOSABS 0.1 01/24/2018 1056    Hgb A1C No results found for: "HGBA1C"      Assessment and Plan:  Cough, Influenza:  Clinically improving Continue Flonase Add Zyrtec daily Continue Promethazine DM as needed  RTC in 2 months for annual exam  Follow Up Instructions:    I discussed the assessment and treatment plan with the patient. The patient was provided an opportunity to ask questions and all were answered. The patient agreed with the plan and demonstrated an understanding of the  instructions.   The patient was advised to call back or seek an in-person evaluation if the symptoms worsen or if the condition fails to improve as anticipated.   Webb Silversmith, NP

## 2022-08-06 ENCOUNTER — Ambulatory Visit (INDEPENDENT_AMBULATORY_CARE_PROVIDER_SITE_OTHER): Payer: Medicare HMO | Admitting: Internal Medicine

## 2022-08-06 ENCOUNTER — Encounter: Payer: Self-pay | Admitting: Internal Medicine

## 2022-08-06 VITALS — BP 116/66 | HR 60 | Temp 96.8°F | Wt 160.0 lb

## 2022-08-06 DIAGNOSIS — J301 Allergic rhinitis due to pollen: Secondary | ICD-10-CM | POA: Diagnosis not present

## 2022-08-06 DIAGNOSIS — R058 Other specified cough: Secondary | ICD-10-CM

## 2022-08-06 LAB — POC COVID19 BINAXNOW: SARS Coronavirus 2 Ag: NEGATIVE

## 2022-08-06 MED ORDER — PREDNISONE 10 MG PO TABS: ORAL_TABLET | ORAL | 0 refills | Status: DC

## 2022-08-06 MED ORDER — PROMETHAZINE-DM 6.25-15 MG/5ML PO SYRP: 5.0000 mL | ORAL_SOLUTION | Freq: Four times a day (QID) | ORAL | 0 refills | Status: DC | PRN

## 2022-08-06 NOTE — Patient Instructions (Signed)

## 2022-08-06 NOTE — Progress Notes (Signed)
Subjective:    Patient ID: Anita Mathews, female    DOB: Aug 18, 1950, 72 y.o.   MRN: [id number]  HPI  Patient presents to clinic today with complaint of sinus pressure, runny nose, post nasal drip, cough.  This started 3 weeks ago. She is blowing clear/white mucous out of her nose. The cough is mostly nonproductive. She denies headache, nasal congestion, ear pain, sore throat, shortness of breath, nausea. Vomiting or diarrhea. She denies fever, chills or body aches.  She was seen 2/19 for similar symptoms, diagnosed with influenza.  She was treated with Fluticasone nasal spray, Tamiflu and Promethazine DM.  She was subsequently seen 2/23 with lingering symptoms.  Clinically she did feel like she was improving.  Zyrtec was added to her current regimen.  Since that time, she reports she got better for a few days but worsened again in the last 2 days. She has tried Mucinex OTC with minimal relief of symptoms.  Review of Systems     Past Medical History:  Diagnosis Date   Arthritis    Glaucoma     Current Outpatient Medications  Medication Sig Dispense Refill   acetaminophen (TYLENOL) 500 MG tablet Take by mouth.     alendronate (FOSAMAX) 70 MG tablet Take 1 tablet (70 mg total) by mouth once a week. Take with a full glass of water on an empty stomach. 12 tablet 1   amLODipine (NORVASC) 2.5 MG tablet TAKE 1 TABLET(2.5 MG) BY MOUTH DAILY 90 tablet 0   Biotin 1 MG CAPS Take 1 capsule by mouth daily.     calcium citrate-vitamin D (CITRACAL+D) 315-200 MG-UNIT tablet Take 1 tablet by mouth every morning.     Cholecalciferol (VITAMIN D-1000 MAX ST) 25 MCG (1000 UT) tablet Take by mouth.     cyanocobalamin 100 MCG tablet      dorzolamide-timolol (COSOPT) 22.3-6.8 MG/ML ophthalmic solution Apply to eye.     ferrous sulfate 325 (65 FE) MG tablet Take by mouth.     fluticasone (FLONASE) 50 MCG/ACT nasal spray Place 2 sprays into both nostrils daily. Use for 4-6 weeks then stop and use seasonally or  as needed. 16 g 2   hydrALAZINE (APRESOLINE) 25 MG tablet TAKE 1 TABLET BY MOUTH THREE TIMES DAILY AS NEEDED FOR SYSTOLIC BLOOD PRESSURE [id number] tablet 0   hydrocortisone 2.5 % cream Apply topically.     ibuprofen (ADVIL) 200 MG tablet Take by mouth.     ketoconazole (NIZORAL) 2 % cream Apply 1 Application topically daily. 30 g 0   Omega-3 Fatty Acids (FISH OIL) 1000 MG CAPS Take 1 capsule by mouth daily.     OPTIVE 0.5-0.9 % ophthalmic solution SMARTSIG:1 Drop(s) In Eye(s) PRN     oseltamivir (TAMIFLU) 75 MG capsule Take 1 capsule (75 mg total) by mouth 2 (two) times daily. For 5 days 10 capsule 0   promethazine-dextromethorphan (PROMETHAZINE-DM) 6.25-15 MG/5ML syrup Take 5 mLs by mouth 4 (four) times daily as needed for cough. 118 mL 0   rosuvastatin (CRESTOR) 5 MG tablet Take 1 tablet (5 mg total) by mouth daily. 90 tablet 1   Secukinumab (COSENTYX) 150 MG/ML SOSY Inject 150 mg into the skin every 30 (thirty) days.     No current facility-administered medications for this visit.    No Known Allergies  Family History  Problem Relation Age of Onset   Breast cancer Mother 72   Breast cancer Paternal Grandmother     Social History   Socioeconomic History  Marital status: Divorced    Spouse name: Not on file   Number of children: Not on file   Years of education: Not on file   Highest education level: Not on file  Occupational History   Not on file  Tobacco Use   Smoking status: Never   Smokeless tobacco: Never  Vaping Use   Vaping Use: Never used  Substance and Sexual Activity   Alcohol use: Yes    Comment: occasional   Drug use: Never   Sexual activity: Not on file  Other Topics Concern   Not on file  Social History Narrative   Not on file   Social Determinants of Health   Financial Resource Strain: Low Risk  (04/04/2022)   Overall Financial Resource Strain (CARDIA)    Difficulty of Paying Living Expenses: Not hard at all  Food Insecurity: No Food Insecurity  (04/04/2022)   Hunger Vital Sign    Worried About Running Out of Food in the Last Year: Never true    [place] in the Last Year: Never true  Transportation Needs: No Transportation Needs (04/04/2022)   PRAPARE - Hydrologist (Medical): No    Lack of Transportation (Non-Medical): No  Physical Activity: Insufficiently Active (04/04/2022)   Exercise Vital Sign    Days of Exercise per Week: 3 days    Minutes of Exercise per Session: 30 min  Stress: No Stress Concern Present (04/04/2022)   Short    Feeling of Stress : Only a little  Social Connections: Moderately Isolated (04/04/2022)   Social Connection and Isolation Panel [NHANES]    Frequency of Communication with Friends and Family: Twice a week    Frequency of Social Gatherings with Friends and Family: More than three times a week    Attends Religious Services: More than 4 times per year    Active Member of [employer] or Organizations: No    Attends Archivist Meetings: Never    Marital Status: Divorced  Human resources officer Violence: Not At Risk (04/04/2022)   Humiliation, Afraid, Rape, and Kick questionnaire    Fear of Current or Ex-Partner: No    Emotionally Abused: No    Physically Abused: No    Sexually Abused: No     Constitutional: Denies fever, malaise, fatigue, headache or abrupt weight changes.  HEENT: Patient reports runny nose and postnasal drip.  Denies eye pain, eye redness, ear pain, ringing in the ears, wax buildup, nasal congestion, bloody nose, or sore throat. Respiratory: Patient reports cough.  Denies difficulty breathing, shortness of breath, or sputum production.   Cardiovascular: Denies chest pain, chest tightness, palpitations or swelling in the hands or feet.  Gastrointestinal: Denies abdominal pain, bloating, constipation, diarrhea or blood in the stool.   No other specific complaints in a complete  review of systems (except as listed in HPI above).  Objective:   Physical Exam   BP 116/66 (BP Location: Left Arm, Patient Position: Sitting, Cuff Size: Normal)   Pulse 60   Temp (!) 96.8 F (36 C) (Temporal)   Wt 160 lb (72.6 kg)   SpO2 100%   BMI 27.68 kg/m   Wt Readings from Last 3 Encounters:  07/21/22 165 lb (74.8 kg)  07/15/22 165 lb (74.8 kg)  07/07/22 167 lb (75.8 kg)    General: Appears her stated age, overweight, in NAD. Skin: Warm, dry and intact.  HEENT: Head: normal shape and size,  no sinus tenderness noted; Eyes: sclera white, no icterus, conjunctiva pink, PERRLA and EOMs intact; Nose: mucosa pink and moist, septum midline; Throat/Mouth: Teeth present, mucosa pink and moist, + PND, no exudate, lesions or ulcerations noted.  Neck: No adenopathy noted. Cardiovascular: Normal rate and rhythm. S1,S2 noted.  No murmur, rubs or gallops noted.  Pulmonary/Chest: Normal effort and positive vesicular breath sounds. No respiratory distress. No wheezes, rales or ronchi noted.  Neurological: Alert and oriented.     BMET    Component Value Date/Time   NA 140 03/20/2022 1343   K 4.0 03/20/2022 1343   CL 104 03/20/2022 1343   CO2 27 03/20/2022 1343   GLUCOSE 119 03/20/2022 1343   BUN 14 03/20/2022 1343   CREATININE 0.86 03/20/2022 1343   CALCIUM 10.0 03/20/2022 1343   GFRNONAA >60 06/06/2021 2244   GFRAA >60 01/24/2018 1056    Lipid Panel     Component Value Date/Time   CHOL 170 03/20/2022 1343   TRIG 142 03/20/2022 1343   HDL 72 03/20/2022 1343   CHOLHDL 2.4 03/20/2022 1343   LDLCALC 75 03/20/2022 1343    CBC    Component Value Date/Time   WBC 6.8 03/20/2022 1343   RBC 4.34 03/20/2022 1343   HGB 13.1 03/20/2022 1343   HCT 38.6 03/20/2022 1343   PLT 321 03/20/2022 1343   MCV 88.9 03/20/2022 1343   MCH 30.2 03/20/2022 1343   MCHC 33.9 03/20/2022 1343   RDW 12.4 03/20/2022 1343   LYMPHSABS 2.2 01/24/2018 1056   MONOABS 0.9 01/24/2018 1056   EOSABS 0.1  01/24/2018 1056   BASOSABS 0.1 01/24/2018 1056    Hgb A1C No results found for: "HGBA1C"         Assessment & Plan:   Postviral Cough Syndrome, Allergic Rhinitis:  Continue Zyrtec and Flonase Rapid COVID-negative Rx for Pred taper x 6 days for symptom management Promethazine DM refilled No indication for antibiotics at this time  RTC in 1 month for annual exam Webb Silversmith, NP

## 2022-08-12 ENCOUNTER — Encounter: Payer: Self-pay | Admitting: Internal Medicine

## 2022-08-12 ENCOUNTER — Telehealth: Payer: Self-pay | Admitting: Internal Medicine

## 2022-08-12 ENCOUNTER — Ambulatory Visit (INDEPENDENT_AMBULATORY_CARE_PROVIDER_SITE_OTHER): Payer: Medicare HMO | Admitting: Internal Medicine

## 2022-08-12 VITALS — BP 134/60 | HR 52 | Temp 96.6°F | Wt 162.0 lb

## 2022-08-12 DIAGNOSIS — R058 Other specified cough: Secondary | ICD-10-CM

## 2022-08-12 MED ORDER — HYDROCODONE BIT-HOMATROP MBR 5-1.5 MG/5ML PO SOLN: 5.0000 mL | Freq: Three times a day (TID) | ORAL | 0 refills | Status: DC | PRN

## 2022-08-12 NOTE — Patient Instructions (Signed)
Cough, Adult A cough helps to clear your throat and lungs. It may be a sign of an illness or another condition. A short-term (acute) cough may last 2-3 weeks. A long-term (chronic) cough may last 8 or more weeks. Many things can cause a cough. They include: Illnesses such as: An infection in your throat or lungs. Asthma or other heart or lung problems. Gastroesophageal reflux. This is when acid comes back up from your stomach. Breathing in things that bother (irritate) your lungs. Allergies. Postnasal drip. This is when mucus runs down the back of your throat. Smoking. Some medicines. Follow these instructions at home: Medicines Take over-the-counter and prescription medicines only as told by your doctor. Talk with your doctor before you take cough medicine (cough suppressants). Eating and drinking Do not drink alcohol. Do not drink caffeine. Drink enough fluid to keep your pee (urine) pale yellow. Lifestyle Stay away from cigarette smoke. Do not smoke or use any products that contain nicotine or tobacco. If you need help quitting, ask your doctor. Stay away from things that make you cough. These may include perfume, candles, cleaning products, or campfire smoke. General instructions  Watch for any changes to your cough. Tell your doctor about them. Always cover your mouth when you cough. If the air is dry in your home, use a cool mist vaporizer or humidifier. If your cough is worse at night, try using extra pillows to raise your head up higher while you sleep. Rest as needed. Contact a doctor if: You have new symptoms. Your symptoms get worse. You cough up pus. You have a fever that does not go away. Your cough does not get better after 2-3 weeks. Cough medicine does not help, and you are not sleeping well. You have pain that gets worse or is not helped with medicine. You are losing weight and do not know why. You have night sweats. Get help right away if: You cough up  blood. You have trouble breathing. Your heart is beating very fast. These symptoms may be an emergency. Get help right away. Call 911. Do not wait to see if the symptoms will go away. Do not drive yourself to the hospital. This information is not intended to replace advice given to you by your health care provider. Make sure you discuss any questions you have with your health care provider. Document Revised: 01/17/2022 Document Reviewed: 01/17/2022 Elsevier Patient Education  2023 Elsevier Inc.  

## 2022-08-12 NOTE — Telephone Encounter (Signed)
The patient called in stating her pharmacy is completley out of stock on the HYDROcodone bit-homatropine (HYCODAN) 5-1.5 MG/5ML syrup can this please be called into one of the other pharmacies on file. Please assist patient further

## 2022-08-12 NOTE — Progress Notes (Signed)
Subjective:    Patient ID: Anita Mathews, female    DOB: 07-Sep-1950, 72 y.o.   MRN: [id number]  HPI  Patient presents to clinic today with complaint of persistent cough, hoarseness and chest congestion. The cough can be productive at times. The cough is keeping her up at night. She was initially seen 2/19, diagnosed with influenza and treated with Tamiflu, Fluticasone nasal spray and Promethazine DM.  She was seen 2/23 and follow-up, advised to add Zyrtec daily.  She was seen 3/6 for persistent symptoms.  She was started on a Prednisone taper and her Promethazine DM was refilled.  Review of Systems     Past Medical History:  Diagnosis Date   Arthritis    Glaucoma     Current Outpatient Medications  Medication Sig Dispense Refill   acetaminophen (TYLENOL) 500 MG tablet Take by mouth.     alendronate (FOSAMAX) 70 MG tablet Take 1 tablet (70 mg total) by mouth once a week. Take with a full glass of water on an empty stomach. 12 tablet 1   amLODipine (NORVASC) 2.5 MG tablet TAKE 1 TABLET(2.5 MG) BY MOUTH DAILY 90 tablet 0   Biotin 1 MG CAPS Take 1 capsule by mouth daily.     Cholecalciferol (VITAMIN D-1000 MAX ST) 25 MCG (1000 UT) tablet Take by mouth.     cyanocobalamin 100 MCG tablet      dorzolamide-timolol (COSOPT) 22.3-6.8 MG/ML ophthalmic solution Apply to eye.     ferrous sulfate 325 (65 FE) MG tablet Take by mouth.     fluticasone (FLONASE) 50 MCG/ACT nasal spray Place 2 sprays into both nostrils daily. Use for 4-6 weeks then stop and use seasonally or as needed. 16 g 2   hydrALAZINE (APRESOLINE) 25 MG tablet TAKE 1 TABLET BY MOUTH THREE TIMES DAILY AS NEEDED FOR SYSTOLIC BLOOD PRESSURE [id number] tablet 0   hydrocortisone 2.5 % cream Apply topically.     ibuprofen (ADVIL) 200 MG tablet Take by mouth.     ketoconazole (NIZORAL) 2 % cream Apply 1 Application topically daily. 30 g 0   Omega-3 Fatty Acids (FISH OIL) 1000 MG CAPS Take 1 capsule by mouth daily.     OPTIVE 0.5-0.9 %  ophthalmic solution SMARTSIG:1 Drop(s) In Eye(s) PRN     predniSONE (DELTASONE) 10 MG tablet Take 6 tabs on day 1, 5 tabs on day 2, 4 tabs on day 3, 3 tabs on day 4, 2 tabs on day 5, 1 tab on day 6 21 tablet 0   promethazine-dextromethorphan (PROMETHAZINE-DM) 6.25-15 MG/5ML syrup Take 5 mLs by mouth 4 (four) times daily as needed for cough. 118 mL 0   rosuvastatin (CRESTOR) 5 MG tablet Take 1 tablet (5 mg total) by mouth daily. 90 tablet 1   Secukinumab (COSENTYX) 150 MG/ML SOSY Inject 150 mg into the skin every 30 (thirty) days.     No current facility-administered medications for this visit.    No Known Allergies  Family History  Problem Relation Age of Onset   Breast cancer Mother 20   Breast cancer Paternal Grandmother     Social History   Socioeconomic History   Marital status: Divorced    Spouse name: Not on file   Number of children: Not on file   Years of education: Not on file   Highest education level: Not on file  Occupational History   Not on file  Tobacco Use   Smoking status: Never   Smokeless tobacco: Never  Vaping Use  Vaping Use: Never used  Substance and Sexual Activity   Alcohol use: Yes    Comment: occasional   Drug use: Never   Sexual activity: Not on file  Other Topics Concern   Not on file  Social History Narrative   Not on file   Social Determinants of Health   Financial Resource Strain: Low Risk  (04/04/2022)   Overall Financial Resource Strain (CARDIA)    Difficulty of Paying Living Expenses: Not hard at all  Food Insecurity: No Food Insecurity (04/04/2022)   Hunger Vital Sign    Worried About Running Out of Food in the Last Year: Never true    Ran Out of Food in the Last Year: Never true  Transportation Needs: No Transportation Needs (04/04/2022)   PRAPARE - Hydrologist (Medical): No    Lack of Transportation (Non-Medical): No  Physical Activity: Insufficiently Active (04/04/2022)   Exercise Vital Sign     Days of Exercise per Week: 3 days    Minutes of Exercise per Session: 30 min  Stress: No Stress Concern Present (04/04/2022)   [place]    Feeling of Stress : Only a little  Social Connections: Moderately Isolated (04/04/2022)   Social Connection and Isolation Panel [NHANES]    Frequency of Communication with Friends and Family: Twice a week    Frequency of Social Gatherings with Friends and Family: More than three times a week    Attends Religious Services: More than 4 times per year    Active Member of [employer] or Organizations: No    Attends Archivist Meetings: Never    Marital Status: Divorced  Human resources officer Violence: Not At Risk (04/04/2022)   Humiliation, Afraid, Rape, and Kick questionnaire    Fear of Current or Ex-Partner: No    Emotionally Abused: No    Physically Abused: No    Sexually Abused: No     Constitutional: Denies fever, malaise, fatigue, headache or abrupt weight changes.  HEENT: Denies eye pain, eye redness, ear pain, ringing in the ears, wax buildup, runny nose, nasal congestion, bloody nose, or sore throat. Respiratory: Patient reports cough.  Denies difficulty breathing, shortness of breath, or sputum production.   Cardiovascular: Denies chest pain, chest tightness, palpitations or swelling in the hands or feet.  Gastrointestinal: Denies abdominal pain, bloating, constipation, diarrhea or blood in the stool.   No other specific complaints in a complete review of systems (except as listed in HPI above).  Objective:   Physical Exam   BP 134/60 (BP Location: Left Arm, Patient Position: Sitting, Cuff Size: Normal)   Pulse (!) 52   Temp (!) 96.6 F (35.9 C) (Temporal)   Wt 162 lb (73.5 kg)   SpO2 100%   BMI 28.03 kg/m   Wt Readings from Last 3 Encounters:  08/06/22 160 lb (72.6 kg)  07/21/22 165 lb (74.8 kg)  07/15/22 165 lb (74.8 kg)    General: Appears her stated age,  overweight n NAD. Skin: Warm, dry and intact.  HEENT: Head: normal shape and size; Eyes: sclera white, no icterus, conjunctiva pink, PERRLA and EOMs intact;  Neck: No adenopathy noted. Cardiovascular: Bradycardic with normal rhythm.  Pulmonary/Chest: Normal effort and positive vesicular breath sounds. No respiratory distress. No wheezes, rales or ronchi noted.  Neurological: Alert and oriented.   BMET    Component Value Date/Time   NA 140 03/20/2022 1343   K 4.0 03/20/2022 1343  CL 104 03/20/2022 1343   CO2 27 03/20/2022 1343   GLUCOSE 119 03/20/2022 1343   BUN 14 03/20/2022 1343   CREATININE 0.86 03/20/2022 1343   CALCIUM 10.0 03/20/2022 1343   GFRNONAA >60 06/06/2021 2244   GFRAA >60 01/24/2018 1056    Lipid Panel     Component Value Date/Time   CHOL 170 03/20/2022 1343   TRIG 142 03/20/2022 1343   HDL 72 03/20/2022 1343   CHOLHDL 2.4 03/20/2022 1343   LDLCALC 75 03/20/2022 1343    CBC    Component Value Date/Time   WBC 6.8 03/20/2022 1343   RBC 4.34 03/20/2022 1343   HGB 13.1 03/20/2022 1343   HCT 38.6 03/20/2022 1343   PLT 321 03/20/2022 1343   MCV 88.9 03/20/2022 1343   MCH 30.2 03/20/2022 1343   MCHC 33.9 03/20/2022 1343   RDW 12.4 03/20/2022 1343   LYMPHSABS 2.2 01/24/2018 1056   MONOABS 0.9 01/24/2018 1056   EOSABS 0.1 01/24/2018 1056   BASOSABS 0.1 01/24/2018 1056    Hgb A1C No results found for: "HGBA1C"         Assessment & Plan:   Post Viral Cough Syndrome:  Reassured her that this will improve with time but I cannot make this go away any quicker Her insurance will not cover Tessalon Perles Her insurance will not cover steroid inhaler Rx for Hycodan every 8 hours as needed-sedation caution given She has an Albuterol inhaler at home that she can use as needed, advised her to use this before bed at night Continue Zyrtec daily RTC in 1 month for annual exam Webb Silversmith, NP

## 2022-08-12 NOTE — Telephone Encounter (Signed)
Patient called in says Hydrochodone homatropine (HYCODAN) 5-1.5 MG/5ML syrup  is now available at pharmacy

## 2022-08-15 ENCOUNTER — Encounter: Payer: Self-pay | Admitting: Internal Medicine

## 2022-08-20 DIAGNOSIS — H5213 Myopia, bilateral: Secondary | ICD-10-CM | POA: Diagnosis not present

## 2022-08-27 ENCOUNTER — Ambulatory Visit
Admission: RE | Admit: 2022-08-27 | Discharge: 2022-08-27 | Disposition: A | Discharge status: Home / Self Care | Payer: Medicare HMO | Source: Ambulatory Visit | Attending: Internal Medicine | Admitting: Internal Medicine

## 2022-08-27 ENCOUNTER — Ambulatory Visit: Payer: Medicare HMO

## 2022-08-27 DIAGNOSIS — Z1231 Encounter for screening mammogram for malignant neoplasm of breast: Secondary | ICD-10-CM | POA: Insufficient documentation

## 2022-09-01 ENCOUNTER — Encounter: Payer: Self-pay | Admitting: Family Medicine

## 2022-09-01 ENCOUNTER — Telehealth (INDEPENDENT_AMBULATORY_CARE_PROVIDER_SITE_OTHER): Payer: Medicare HMO | Admitting: Family Medicine

## 2022-09-01 VITALS — Ht 63.75 in | Wt 162.0 lb

## 2022-09-01 DIAGNOSIS — L237 Allergic contact dermatitis due to plants, except food: Secondary | ICD-10-CM

## 2022-09-01 MED ORDER — HYDROXYZINE HCL 10 MG PO TABS: 10.0000 mg | ORAL_TABLET | Freq: Three times a day (TID) | ORAL | 0 refills | Status: DC | PRN

## 2022-09-01 MED ORDER — TRIAMCINOLONE ACETONIDE 0.1 % EX CREA: 1.0000 | TOPICAL_CREAM | Freq: Two times a day (BID) | CUTANEOUS | 0 refills | Status: DC | PRN

## 2022-09-01 NOTE — Progress Notes (Signed)
Subjective:    Patient ID: Anita Mathews, female    DOB: 1951-03-05, 72 y.o.   MRN: [id number]  MINSA WIEMER is a 72 y.o. female presenting on 09/01/2022 for [place] / Telehealth Encounter - Video Visit via MyChart The purpose of this virtual visit is to provide medical care while limiting exposure to the novel coronavirus (COVID19) for both patient and office staff.  Consent was obtained for remote visit:  Yes.   Answered questions that patient had about telehealth interaction:  Yes.   I discussed the limitations, risks, security and privacy concerns of performing an evaluation and management service by video/telephone. I also discussed with the patient that there may be a patient responsible charge related to this service. The patient expressed understanding and agreed to proceed.  Patient Location: Home Provider Location: [facility] (Office)  Participants in virtual visit: - Patient: Anita Mathews - CMA: Orinda Kenner, [place] - Provider: Dr Parks Ranger  PCP Webb Silversmith, FNP   HPI  Poison ivy dermatitis Reports 1 week ago she was doing yardwork and pulling out [place]. She reports may have been exposed to poison ivy during this yardwork. She usually does wear gloves, but admits some exposure without gloves. Several spots on Left lower face, bilateral forearm, and lower inner thigh near her knees. She tried topical cortisone cream with some mild anti itch relief Tried Benadryl some relief but made her sleepy groggy She has had prior poison ivy before few years ago, usually goes away within 1 week. Denies any fever chills spreading redness.  Has some Prednisone 10mg  left over 13 pills from prior coughing illness      07/07/2022    3:25 PM 04/04/2022    3:45 PM 09/05/2021    2:59 PM  Depression screen PHQ 2/9  Decreased Interest 0 0 0  Down, Depressed, Hopeless 0 0 0  PHQ - 2 Score 0 0 0  Altered sleeping  0 1  Tired, decreased  energy  0 0  Change in appetite  0 0  Feeling bad or failure about yourself   0 0  Trouble concentrating  0 0  Moving slowly or fidgety/restless  0 0  Suicidal thoughts  0 0  PHQ-9 Score  0 1  Difficult doing work/chores  Not difficult at all Not difficult at all    Social History   Tobacco Use   Smoking status: Never   Smokeless tobacco: Never  Vaping Use   Vaping Use: Never used  Substance Use Topics   Alcohol use: Yes    Comment: occasional   Drug use: Never    Review of Systems Per HPI unless specifically indicated above     Objective:    Ht 5' 3.75" (1.619 m)   Wt 162 lb (73.5 kg)   BMI 28.03 kg/m   Wt Readings from Last 3 Encounters:  09/01/22 162 lb (73.5 kg)  08/12/22 162 lb (73.5 kg)  08/06/22 160 lb (72.6 kg)    Physical Exam  Note examination was completely remotely via video observation objective data only  Gen - well-appearing, no acute distress or apparent pain, comfortable HEENT - eyes appear clear without discharge or redness Heart/Lungs - cannot examine virtually - observed no evidence of coughing or labored breathing. Abd - cannot examine virtually  Skin - rash visualized on forearms small linear excoriations, and left lower face jawline visualized consistent with poison ivy Neuro - awake, alert, oriented Psych -  not anxious appearing   Results for orders placed or performed in visit on 08/06/22  POC COVID-19  Result Value Ref Range   SARS Coronavirus 2 Ag Negative Negative      Assessment & Plan:   Problem List Items Addressed This Visit   None Visit Diagnoses     Poison ivy dermatitis    -  Primary   Relevant Medications   predniSONE (DELTASONE) 10 MG tablet   triamcinolone cream (KENALOG) 0.1 %   hydrOXYzine (ATARAX) 10 MG tablet       Poison Ivy rash 1 week duration now, improved but not resolving.  (Already has rx at home counted 13 pills) - Start Prednisone (antiinflammatory steroid) - take 4 tablets with meal today,  then take 3 tablets with meal tomorrow, then 3 next day, then 2, then 1, then done. Modified taper  Rx stronger topical Steroid Triamcinolone for spot treatment  Rx Hydroxyzine 10mg  take 1-2 as needed for itching up to max 3 times per day, instead of benadryl, do not mix.  - if develop swelling, redness, fever, and warmth, drainage of pus concern for infection please return sooner. - Try your best to avoid scratching (reduce chances of infection)  In the future, to help prevent poison ivy skin rash it is recommended to thoroughly wash potentially exposed areas with soap or even liquid dish soap (if it is hand safe) within 30 minutes of initial exposure. You can reduce risk of flare up if you wash within 1 to 3 hours, but if you wait more than 3 hours, high likelihood of developing the rash.  No orders of the defined types were placed in this encounter.    Meds ordered this encounter  Medications   triamcinolone cream (KENALOG) 0.1 %    Sig: Apply 1 Application topically 2 (two) times daily as needed (rash). For 1-2 weeks as needed    Dispense:  30 g    Refill:  0   hydrOXYzine (ATARAX) 10 MG tablet    Sig: Take 1-2 tablets (10-20 mg total) by mouth 3 (three) times daily as needed for itching.    Dispense:  30 tablet    Refill:  0      Follow up plan: Return if symptoms worsen or fail to improve.   Patient verbalizes understanding with the above medical recommendations including the limitation of remote medical advice.  Specific follow-up and call-back criteria were given for patient to follow-up or seek medical care more urgently if needed.  Total duration of direct patient care provided via video conference: 10 minutes   Nobie Putnam, [facility] 09/01/2022, 10:27 AM

## 2022-09-01 NOTE — Patient Instructions (Addendum)
It looks like you do have [place] [facility] [place] Recommend to use topical treatments with [place], (may try oatmeal bath), and may take Benadryl at night as needed for itching and sleep.  Start Prednisone (antiinflammatory steroid) - take 4 tablets with meal today, then take 3 tablets with meal tomorrow, then 3 next day, then 2, then 1, then done. Modified taper  Rx stronger topical Steroid Triamcinolone for spot treatment  Rx Hydroxyzine 10mg  take 1-2 as needed for itching up to max 3 times per day, instead of benadryl, do not mix.  - if develop swelling, redness, fever, and warmth, drainage of pus concern for infection please return sooner. - Try your best to avoid scratching (reduce chances of infection)  In the future, to help prevent poison ivy skin rash it is recommended to thoroughly wash potentially exposed areas with soap or even liquid dish soap (if it is hand safe) within 30 minutes of initial exposure. You can reduce risk of flare up if you wash within 1 to 3 hours, but if you wait more than 3 hours, high likelihood of developing the rash.   Please schedule a Follow-up Appointment to: Return if symptoms worsen or fail to improve.  If you have any other questions or concerns, please feel free to call the office or send a message through [facility]. You may also schedule an earlier appointment if necessary.  Additionally, you may be receiving a survey about your experience at our office within a few days to 1 week by e-mail or mail. We value your feedback.  Nobie Putnam, DO [place]

## 2022-09-11 DIAGNOSIS — L409 Psoriasis, unspecified: Secondary | ICD-10-CM | POA: Diagnosis not present

## 2022-09-11 DIAGNOSIS — H35343 Macular cyst, hole, or pseudohole, bilateral: Secondary | ICD-10-CM | POA: Diagnosis not present

## 2022-09-11 DIAGNOSIS — Z7960 Long term (current) use of unspecified immunomodulators and immunosuppressants: Secondary | ICD-10-CM | POA: Diagnosis not present

## 2022-09-11 DIAGNOSIS — L4050 Arthropathic psoriasis, unspecified: Secondary | ICD-10-CM | POA: Diagnosis not present

## 2022-09-11 DIAGNOSIS — H35371 Puckering of macula, right eye: Secondary | ICD-10-CM | POA: Diagnosis not present

## 2022-09-13 IMAGING — MG MM DIGITAL SCREENING BILAT W/ TOMO AND CAD
6 of 10 series · 6 of 30 positions shown · non-contrast
Comparison: Previous exam(s).

CLINICAL DATA: Screening.

EXAM:
DIGITAL SCREENING BILATERAL MAMMOGRAM WITH TOMOSYNTHESIS AND CAD
TECHNIQUE: Bilateral screening digital craniocaudal and mediolateral oblique
mammograms were obtained. Bilateral screening digital breast
tomosynthesis was performed. The images were evaluated with
computer-aided detection.

[L AT synth-2D]
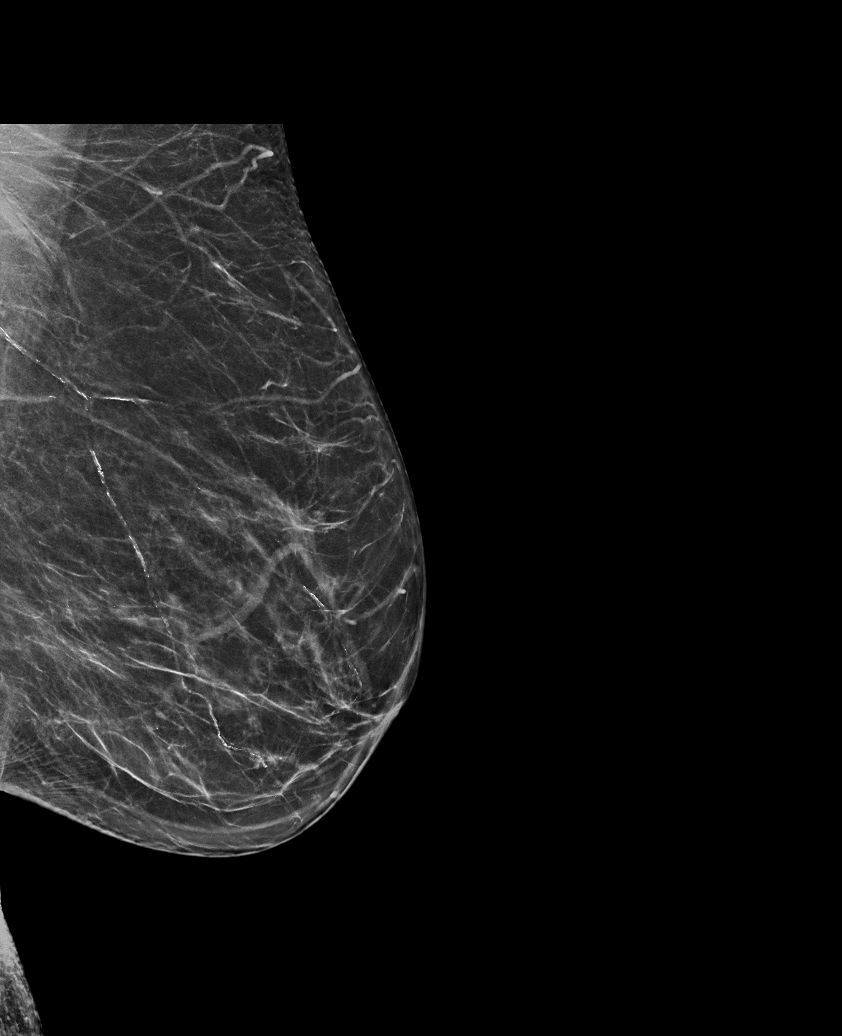

[R MLO synth-2D]
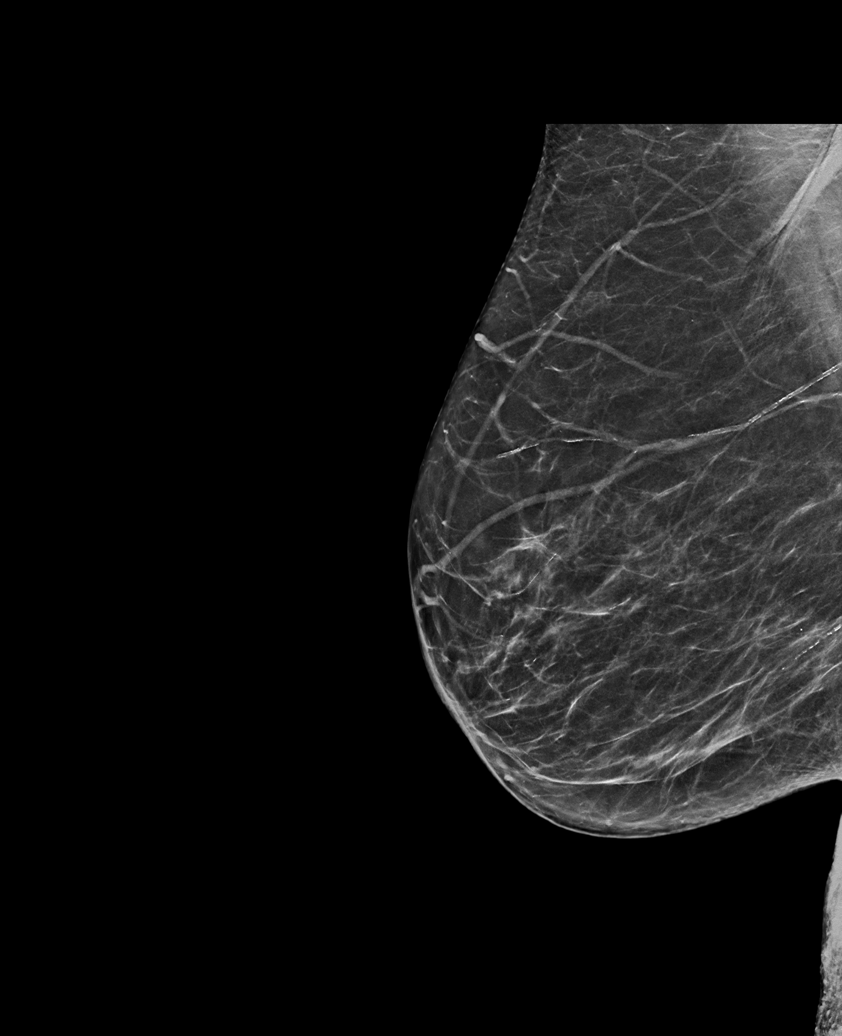

[R CC synth-2D]
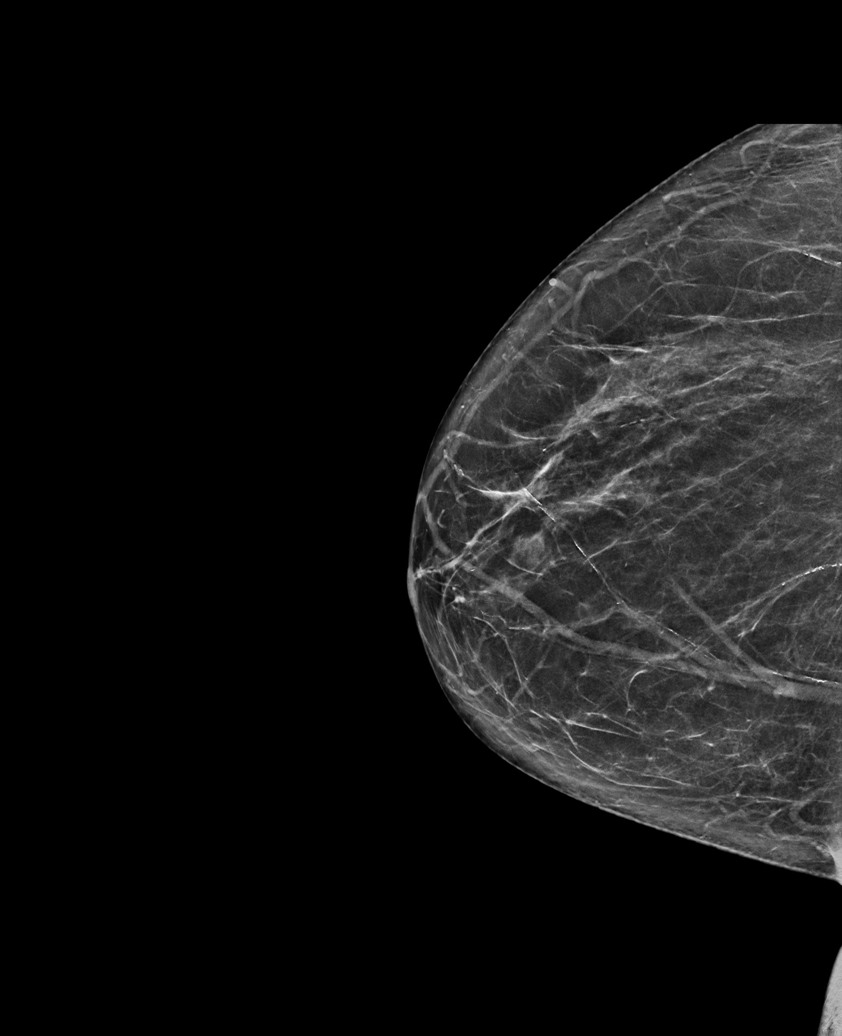

[L MLO synth-2D]
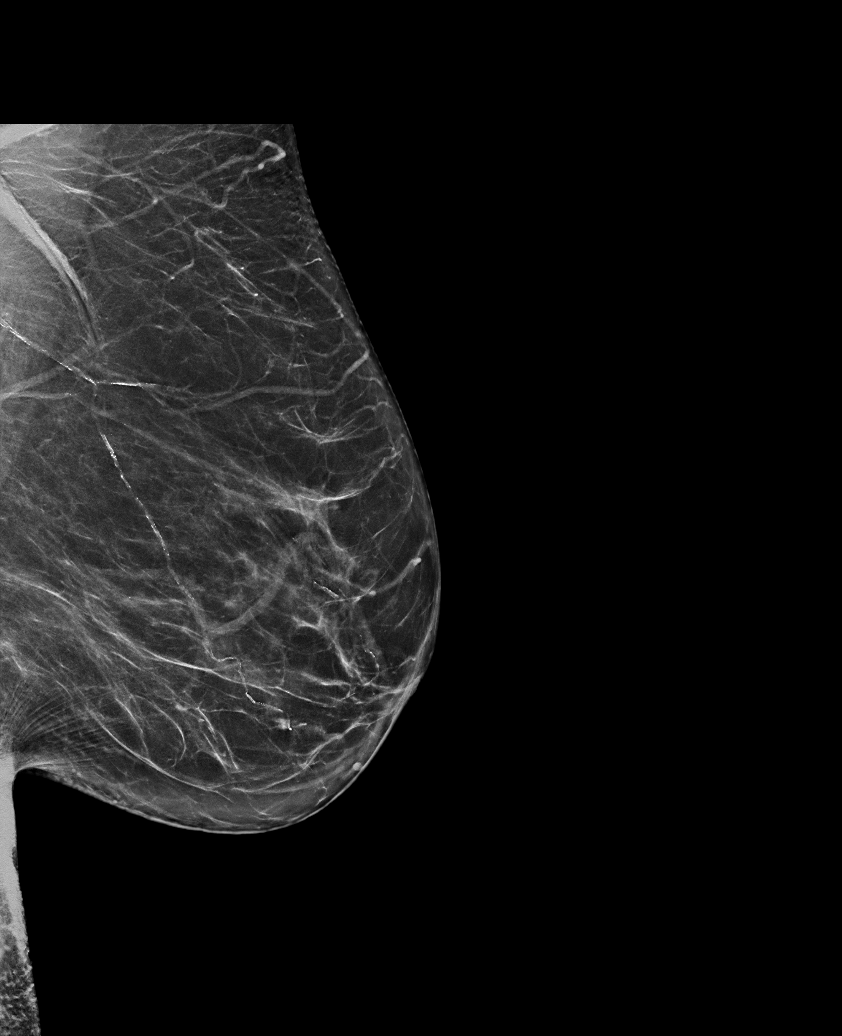

[L CC synth-2D]
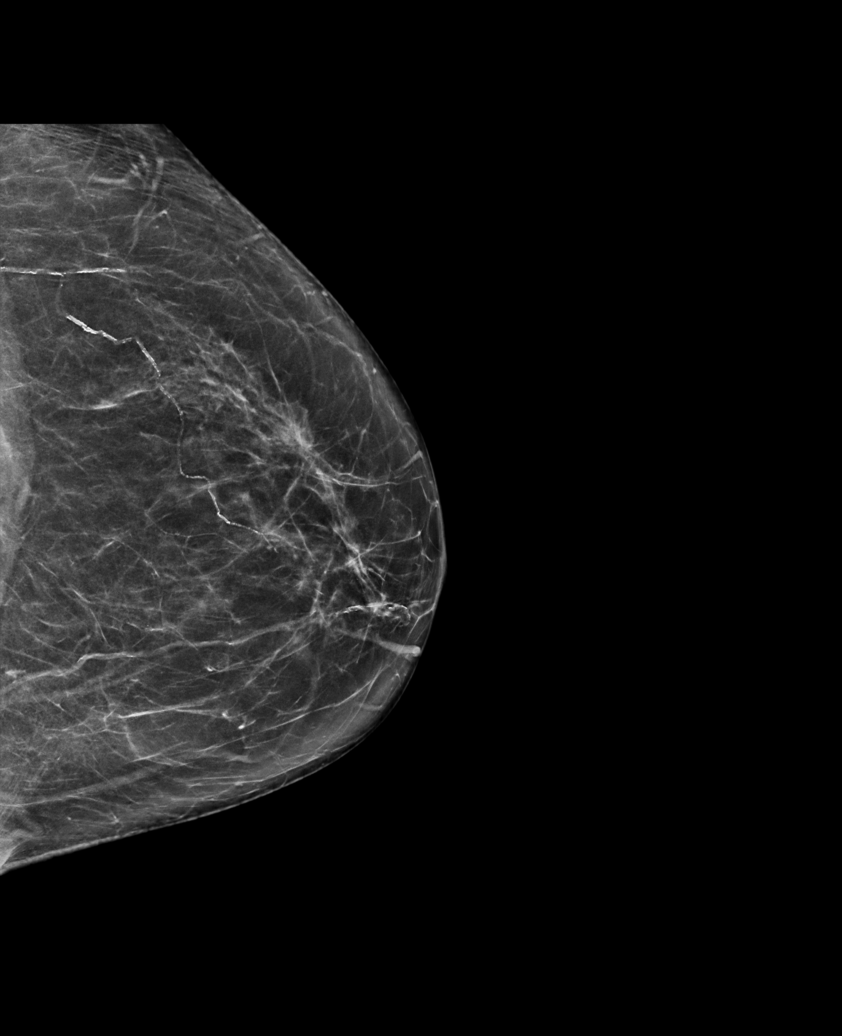

[R MLO tomo · tomo slice 35/68.0]
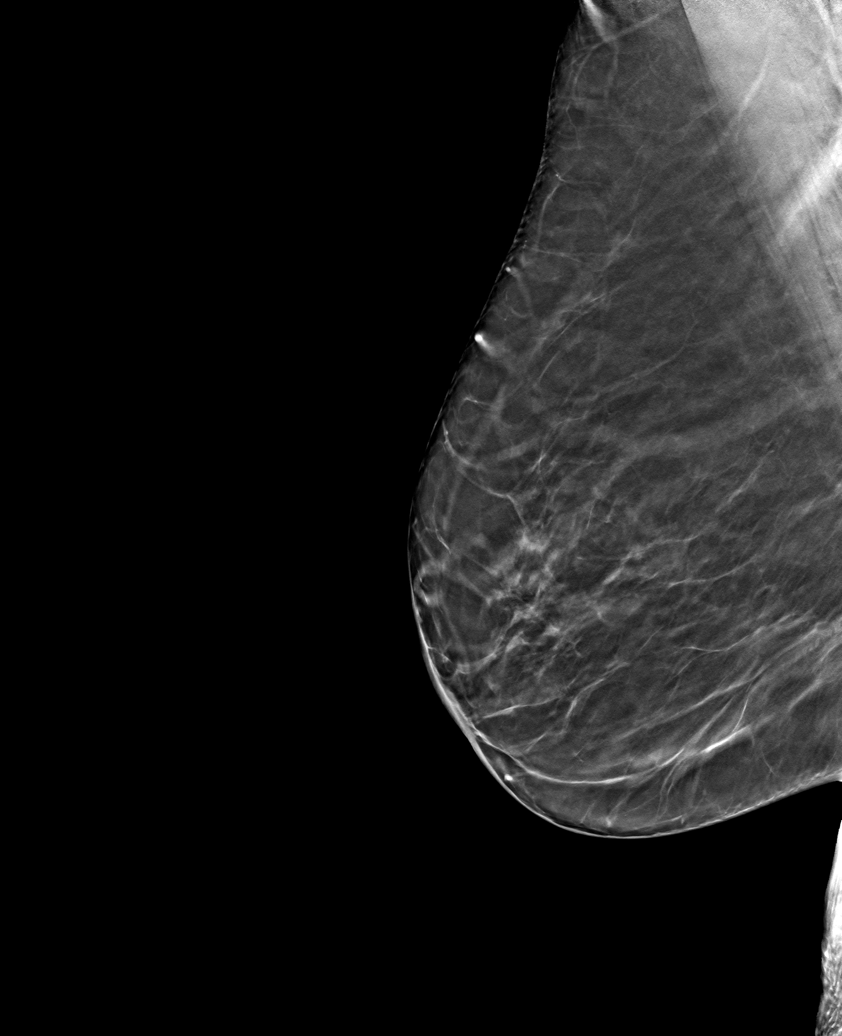

[6 of 30 positions shown; findings below may reference images not displayed]

ACR Breast Density Category b: There are scattered areas of
fibroglandular density.
FINDINGS: There are no findings suspicious for malignancy.
IMPRESSION: No mammographic evidence of malignancy. A result letter of this
screening mammogram will be mailed directly to the patient.

RECOMMENDATION:
Screening mammogram in one year. (Code:51-[id number])

BI-RADS CATEGORY  1: Negative.

## 2022-09-14 ENCOUNTER — Other Ambulatory Visit: Payer: Self-pay | Admitting: Internal Medicine

## 2022-09-15 NOTE — Telephone Encounter (Signed)
Requested Prescriptions  Pending Prescriptions Disp Refills   amLODipine (NORVASC) 2.5 MG tablet [Pharmacy Med Name: AMLODIPINE BESYLATE 2.5MG  TABLETS] 90 tablet 0    Sig: TAKE 1 TABLET(2.5 MG) BY MOUTH DAILY     Cardiovascular: Calcium Channel Blockers 2 Passed - 09/14/2022  8:40 AM      Passed - Last BP in normal range    BP Readings from Last 1 Encounters:  08/12/22 134/60         Passed - Last Heart Rate in normal range    Pulse Readings from Last 1 Encounters:  08/12/22 (!) 52         Passed - Valid encounter within last 6 months    Recent Outpatient Visits           2 weeks ago Poison ivy dermatitis   [facility] [place], [place] J, DO   1 month ago Post-viral cough syndrome   [facility] [place], [place] W, NP   1 month ago Seasonal allergic rhinitis due to pollen   [facility], Salvadore Oxford, NP   1 month ago Subacute cough   [facility] [place], Salvadore Oxford, NP   1 month ago Influenza   [facility] S[place]ty Cords, DO       Future Appointments             In 2 days Sampson Si, Salvadore Oxford, NP [facility], [facility]

## 2022-09-16 ENCOUNTER — Ambulatory Visit: Payer: Medicare HMO | Admitting: Internal Medicine

## 2022-09-17 ENCOUNTER — Ambulatory Visit: Payer: Medicare HMO | Admitting: Internal Medicine

## 2022-09-29 ENCOUNTER — Ambulatory Visit (INDEPENDENT_AMBULATORY_CARE_PROVIDER_SITE_OTHER): Payer: Medicare HMO | Admitting: Internal Medicine

## 2022-09-29 ENCOUNTER — Encounter: Payer: Self-pay | Admitting: Internal Medicine

## 2022-09-29 VITALS — BP 122/74 | HR 57 | Temp 96.2°F | Wt 158.0 lb

## 2022-09-29 DIAGNOSIS — Z6827 Body mass index (BMI) 27.0-27.9, adult: Secondary | ICD-10-CM

## 2022-09-29 DIAGNOSIS — E663 Overweight: Secondary | ICD-10-CM | POA: Diagnosis not present

## 2022-09-29 DIAGNOSIS — Z0001 Encounter for general adult medical examination with abnormal findings: Secondary | ICD-10-CM

## 2022-09-29 NOTE — Assessment & Plan Note (Signed)
Encouraged diet and exercise for weight loss ?

## 2022-09-29 NOTE — Progress Notes (Signed)
Subjective:    Patient ID: Anita Mathews, female    DOB: 05/31/1951, 72 y.o.   MRN: [id number]  HPI  Patient presents to clinic today for annual exam.  Flu: 04/2022 Tetanus: 11/2012 COVID: Pfizer x 4 Pneumovax: 01/2019 Prevnar: 05/2017 Shingrix: Never Pap smear: 07/2020 Mammogram: 08/2022 Bone density: 04/2020 Colon screening: 04/2019 Vision screening: annually Dentist: biannually  Diet: She does eat some meat. She consumes fruits and veggies. She tries to avoid fried foods. She drinks mostly water, coffee. Exercise: Yardwork   Review of Systems     Past Medical History:  Diagnosis Date   Arthritis    Glaucoma     Current Outpatient Medications  Medication Sig Dispense Refill   acetaminophen (TYLENOL) 500 MG tablet Take by mouth.     alendronate (FOSAMAX) 70 MG tablet Take 1 tablet (70 mg total) by mouth once a week. Take with a full glass of water on an empty stomach. 12 tablet 1   amLODipine (NORVASC) 2.5 MG tablet TAKE 1 TABLET(2.5 MG) BY MOUTH DAILY 90 tablet 0   Biotin 1 MG CAPS Take 1 capsule by mouth daily.     Cholecalciferol (VITAMIN D-1000 MAX ST) 25 MCG (1000 UT) tablet Take by mouth.     cyanocobalamin 100 MCG tablet      dorzolamide-timolol (COSOPT) 22.3-6.8 MG/ML ophthalmic solution Apply to eye.     ferrous sulfate 325 (65 FE) MG tablet Take by mouth.     fluticasone (FLONASE) 50 MCG/ACT nasal spray Place 2 sprays into both nostrils daily. Use for 4-6 weeks then stop and use seasonally or as needed. 16 g 2   hydrALAZINE (APRESOLINE) 25 MG tablet TAKE 1 TABLET BY MOUTH THREE TIMES DAILY AS NEEDED FOR SYSTOLIC BLOOD PRESSURE 270 tablet 0   HYDROcodone bit-homatropine (HYCODAN) 5-1.5 MG/5ML syrup Take 5 mLs by mouth every 8 (eight) hours as needed for cough. 120 mL 0   hydrocortisone 2.5 % cream Apply topically.     hydrOXYzine (ATARAX) 10 MG tablet Take 1-2 tablets (10-20 mg total) by mouth 3 (three) times daily as needed for itching. 30 tablet 0    ibuprofen (ADVIL) 200 MG tablet Take by mouth.     ketoconazole (NIZORAL) 2 % cream Apply 1 Application topically daily. 30 g 0   Omega-3 Fatty Acids (FISH OIL) 1000 MG CAPS Take 1 capsule by mouth daily.     OPTIVE 0.5-0.9 % ophthalmic solution SMARTSIG:1 Drop(s) In Eye(s) PRN     predniSONE (DELTASONE) 10 MG tablet Take 4 tabs with breakfast Day 1, 3 tabs Day 2, 3 tabs Day 3, 2 tabs Day 4, then 1 tab on Day 5 to finish 13 tablet 0   rosuvastatin (CRESTOR) 5 MG tablet Take 1 tablet (5 mg total) by mouth daily. 90 tablet 1   Secukinumab (COSENTYX) 150 MG/ML SOSY Inject 150 mg into the skin every 30 (thirty) days.     triamcinolone cream (KENALOG) 0.1 % Apply 1 Application topically 2 (two) times daily as needed (rash). For 1-2 weeks as needed 30 g 0   No current facility-administered medications for this visit.    No Known Allergies  Family History  Problem Relation Age of Onset   Breast cancer Mother 64   Breast cancer Paternal Grandmother     Social History   Socioeconomic History   Marital status: Divorced    Spouse name: Not on file   Number of children: Not on file   Years of education: Not on  file   Highest education level: Doctorate  Occupational History   Not on file  Tobacco Use   Smoking status: Never   Smokeless tobacco: Never  Vaping Use   Vaping Use: Never used  Substance and Sexual Activity   Alcohol use: Yes    Comment: occasional   Drug use: Never   Sexual activity: Not on file  Other Topics Concern   Not on file  Social History Narrative   Not on file   Social Determinants of Health   Financial Resource Strain: Low Risk  (09/25/2022)   Overall Financial Resource Strain (CARDIA)    Difficulty of Paying Living Expenses: Not very hard  Food Insecurity: No Food Insecurity (09/25/2022)   Hunger Vital Sign    Worried About Running Out of Food in the Last Year: Never true    Ran Out of Food in the Last Year: Never true  Transportation Needs: No  Transportation Needs (09/25/2022)   PRAPARE - Administrator, Civil Service (Medical): No    Lack of Transportation (Non-Medical): No  Physical Activity: Unknown (09/25/2022)   Exercise Vital Sign    Days of Exercise per Week: Patient declined    Minutes of Exercise per Session: 30 min  Stress: No Stress Concern Present (09/25/2022)   [employer] of Occupational Health - Occupational Stress Questionnaire    Feeling of Stress : Only a little  Social Connections: Unknown (09/25/2022)   Social Connection and Isolation Panel [NHANES]    Frequency of Communication with Friends and Family: More than three times a week    Frequency of Social Gatherings with Friends and Family: Three times a week    Attends Religious Services: Patient declined    Active Member of Clubs or Organizations: No    Attends Banker Meetings: Never    Marital Status: Divorced  Catering manager Violence: Not At Risk (04/04/2022)   Humiliation, Afraid, Rape, and Kick questionnaire    Fear of Current or Ex-Partner: No    Emotionally Abused: No    Physically Abused: No    Sexually Abused: No     Constitutional: Denies fever, malaise, fatigue, headache or abrupt weight changes.  HEENT: Denies eye pain, eye redness, ear pain, ringing in the ears, wax buildup, runny nose, nasal congestion, bloody nose, or sore throat. Respiratory: Denies difficulty breathing, shortness of breath, cough or sputum production.   Cardiovascular: Denies chest pain, chest tightness, palpitations or swelling in the hands or feet.  Gastrointestinal: Denies abdominal pain, bloating, constipation, diarrhea or blood in the stool.  GU: Denies urgency, frequency, pain with urination, burning sensation, blood in urine, odor or discharge. Musculoskeletal: Patient reports joint pain.  Denies decrease in range of motion, difficulty with gait, muscle pain or joint swelling.  Skin: Denies redness, rashes, lesions or ulcercations.   Neurological: Denies dizziness, difficulty with memory, difficulty with speech or problems with balance and coordination.  Psych: Denies anxiety, depression, SI/HI.  No other specific complaints in a complete review of systems (except as listed in HPI above).  Objective:   Physical Exam  BP 122/74 (BP Location: Left Arm, Patient Position: Sitting, Cuff Size: Normal)   Pulse (!) 57   Temp (!) 96.2 F (35.7 C) (Temporal)   Wt 158 lb (71.7 kg)   SpO2 100%   BMI 27.33 kg/m   Wt Readings from Last 3 Encounters:  09/01/22 162 lb (73.5 kg)  08/12/22 162 lb (73.5 kg)  08/06/22 160 lb (72.6 kg)  General: Appears her stated age, overweight, in NAD. Skin: Warm, dry and intact.  HEENT: Head: normal shape and size; Eyes: sclera white, no icterus, conjunctiva pink, PERRLA and EOMs intact;  Neck:  Neck supple, trachea midline. No masses, lumps or thyromegaly present.  Cardiovascular: Bradycardic rhythm. S1,S2 noted.  No murmur, rubs or gallops noted. No JVD or BLE edema. No carotid bruits noted. Pulmonary/Chest: Normal effort and positive vesicular breath sounds. No respiratory distress. No wheezes, rales or ronchi noted.  Abdomen: Normal bowel sounds.  Musculoskeletal: Strength 5/5 BUE/BLE. No difficulty with gait.  Neurological: Alert and oriented. Cranial nerves II-XII grossly intact. Coordination normal.  Psychiatric: Mood and affect normal. Behavior is normal. Judgment and thought content normal.    BMET    Component Value Date/Time   NA 140 03/20/2022 1343   K 4.0 03/20/2022 1343   CL 104 03/20/2022 1343   CO2 27 03/20/2022 1343   GLUCOSE 119 03/20/2022 1343   BUN 14 03/20/2022 1343   CREATININE 0.86 03/20/2022 1343   CALCIUM 10.0 03/20/2022 1343   GFRNONAA >60 06/06/2021 2244   GFRAA >60 01/24/2018 1056    Lipid Panel     Component Value Date/Time   CHOL 170 03/20/2022 1343   TRIG 142 03/20/2022 1343   HDL 72 03/20/2022 1343   CHOLHDL 2.4 03/20/2022 1343   LDLCALC  75 03/20/2022 1343    CBC    Component Value Date/Time   WBC 6.8 03/20/2022 1343   RBC 4.34 03/20/2022 1343   HGB 13.1 03/20/2022 1343   HCT 38.6 03/20/2022 1343   PLT 321 03/20/2022 1343   MCV 88.9 03/20/2022 1343   MCH 30.2 03/20/2022 1343   MCHC 33.9 03/20/2022 1343   RDW 12.4 03/20/2022 1343   LYMPHSABS 2.2 01/24/2018 1056   MONOABS 0.9 01/24/2018 1056   EOSABS 0.1 01/24/2018 1056   BASOSABS 0.1 01/24/2018 1056    Hgb A1C No results found for: "HGBA1C"         Assessment & Plan:   Preventative Health Maintenance:  Encouraged her to get a flu shot in the fall Tetanus UTD Encouraged her to get her COVID booster Pneumovax and Prevnar UTD Discussed Shingrix vaccines, she will check coverage with her insurance company schedule a nurse visit if she would like to have this done She no longer wants to screen for cervical cancer Mammogram UTD Bone density UTD Colon screening UTD Encouraged her to consume a balanced diet and exercise regimen Recent labs reviewed  RTC in 6 months, follow-up chronic conditions Nicki Reaper, NP

## 2022-09-29 NOTE — Patient Instructions (Signed)
Health Maintenance for Postmenopausal Women Menopause is a normal process in which your ability to get pregnant comes to an end. This process happens slowly over many months or years, usually between the ages of 48 and 55. Menopause is complete when you have missed your menstrual period for 12 months. It is important to talk with your health care provider about some of the most common conditions that affect women after menopause (postmenopausal women). These include heart disease, cancer, and bone loss (osteoporosis). Adopting a healthy lifestyle and getting preventive care can help to promote your health and wellness. The actions you take can also lower your chances of developing some of these common conditions. What are the signs and symptoms of menopause? During menopause, you may have the following symptoms: Hot flashes. These can be moderate or severe. Night sweats. Decrease in sex drive. Mood swings. Headaches. Tiredness (fatigue). Irritability. Memory problems. Problems falling asleep or staying asleep. Talk with your health care provider about treatment options for your symptoms. Do I need hormone replacement therapy? Hormone replacement therapy is effective in treating symptoms that are caused by menopause, such as hot flashes and night sweats. Hormone replacement carries certain risks, especially as you become older. If you are thinking about using estrogen or estrogen with progestin, discuss the benefits and risks with your health care provider. How can I reduce my risk for heart disease and stroke? The risk of heart disease, heart attack, and stroke increases as you age. One of the causes may be a change in the body's hormones during menopause. This can affect how your body uses dietary fats, triglycerides, and cholesterol. Heart attack and stroke are medical emergencies. There are many things that you can do to help prevent heart disease and stroke. Watch your blood pressure High  blood pressure causes heart disease and increases the risk of stroke. This is more likely to develop in people who have high blood pressure readings or are overweight. Have your blood pressure checked: Every 3-5 years if you are 18-39 years of age. Every year if you are 40 years old or older. Eat a healthy diet  Eat a diet that includes plenty of vegetables, fruits, low-fat dairy products, and lean protein. Do not eat a lot of foods that are high in solid fats, added sugars, or sodium. Get regular exercise Get regular exercise. This is one of the most important things you can do for your health. Most adults should: Try to exercise for at least 150 minutes each week. The exercise should increase your heart rate and make you sweat (moderate-intensity exercise). Try to do strengthening exercises at least twice each week. Do these in addition to the moderate-intensity exercise. Spend less time sitting. Even light physical activity can be beneficial. Other tips Work with your health care provider to achieve or maintain a healthy weight. Do not use any products that contain nicotine or tobacco. These products include cigarettes, chewing tobacco, and vaping devices, such as e-cigarettes. If you need help quitting, ask your health care provider. Know your numbers. Ask your health care provider to check your cholesterol and your blood sugar (glucose). Continue to have your blood tested as directed by your health care provider. Do I need screening for cancer? Depending on your health history and family history, you may need to have cancer screenings at different stages of your life. This may include screening for: Breast cancer. Cervical cancer. Lung cancer. Colorectal cancer. What is my risk for osteoporosis? After menopause, you may be   at increased risk for osteoporosis. Osteoporosis is a condition in which bone destruction happens more quickly than new bone creation. To help prevent osteoporosis or  the bone fractures that can happen because of osteoporosis, you may take the following actions: If you are 19-50 years old, get at least 1,000 mg of calcium and at least 600 international units (IU) of vitamin D per day. If you are older than age 50 but younger than age 70, get at least 1,200 mg of calcium and at least 600 international units (IU) of vitamin D per day. If you are older than age 70, get at least 1,200 mg of calcium and at least 800 international units (IU) of vitamin D per day. Smoking and drinking excessive alcohol increase the risk of osteoporosis. Eat foods that are rich in calcium and vitamin D, and do weight-bearing exercises several times each week as directed by your health care provider. How does menopause affect my mental health? Depression may occur at any age, but it is more common as you become older. Common symptoms of depression include: Feeling depressed. Changes in sleep patterns. Changes in appetite or eating patterns. Feeling an overall lack of motivation or enjoyment of activities that you previously enjoyed. Frequent crying spells. Talk with your health care provider if you think that you are experiencing any of these symptoms. General instructions See your health care provider for regular wellness exams and vaccines. This may include: Scheduling regular health, dental, and eye exams. Getting and maintaining your vaccines. These include: Influenza vaccine. Get this vaccine each year before the flu season begins. Pneumonia vaccine. Shingles vaccine. Tetanus, diphtheria, and pertussis (Tdap) booster vaccine. Your health care provider may also recommend other immunizations. Tell your health care provider if you have ever been abused or do not feel safe at home. Summary Menopause is a normal process in which your ability to get pregnant comes to an end. This condition causes hot flashes, night sweats, decreased interest in sex, mood swings, headaches, or lack  of sleep. Treatment for this condition may include hormone replacement therapy. Take actions to keep yourself healthy, including exercising regularly, eating a healthy diet, watching your weight, and checking your blood pressure and blood sugar levels. Get screened for cancer and depression. Make sure that you are up to date with all your vaccines. This information is not intended to replace advice given to you by your health care provider. Make sure you discuss any questions you have with your health care provider. Document Revised: 10/08/2020 Document Reviewed: 10/08/2020 Elsevier Patient Education  2023 Elsevier Inc.  

## 2022-10-01 DIAGNOSIS — H401122 Primary open-angle glaucoma, left eye, moderate stage: Secondary | ICD-10-CM | POA: Diagnosis not present

## 2022-10-01 DIAGNOSIS — H04123 Dry eye syndrome of bilateral lacrimal glands: Secondary | ICD-10-CM | POA: Diagnosis not present

## 2022-10-01 DIAGNOSIS — Z961 Presence of intraocular lens: Secondary | ICD-10-CM | POA: Diagnosis not present

## 2022-10-09 DIAGNOSIS — Z961 Presence of intraocular lens: Secondary | ICD-10-CM | POA: Diagnosis not present

## 2022-10-27 ENCOUNTER — Other Ambulatory Visit: Payer: Self-pay | Admitting: Internal Medicine

## 2022-10-28 NOTE — Telephone Encounter (Signed)
Requested by interface surescripts. Future visit in 5 months. Last labs 03/20/22 Requested Prescriptions  Pending Prescriptions Disp Refills   rosuvastatin (CRESTOR) 5 MG tablet [Pharmacy Med Name: ROSUVASTATIN 5MG  TABLETS] 90 tablet 1    Sig: TAKE 1 TABLET(5 MG) BY MOUTH DAILY     Cardiovascular:  Antilipid - Statins 2 Failed - 10/27/2022 10:50 AM      Failed - Lipid Panel in normal range within the last 12 months    Cholesterol  Date Value Ref Range Status  03/20/2022 170 <200 mg/dL Final   LDL Cholesterol (Calc)  Date Value Ref Range Status  03/20/2022 75 mg/dL (calc) Final    Comment:    Reference range: <100 . Desirable range <100 mg/dL for primary prevention;   <70 mg/dL for patients with CHD or diabetic patients  with > or = 2 CHD risk factors. Marland Kitchen LDL-C is now calculated using the Martin-Hopkins  calculation, which is a validated novel method providing  better accuracy than the Friedewald equation in the  estimation of LDL-C.  Horald Pollen et al. Lenox Ahr. 2130;865(78): 2061-2068  (http://education.[facility].com/faq/FAQ164)    HDL  Date Value Ref Range Status  03/20/2022 72 > OR = 50 mg/dL Final   Triglycerides  Date Value Ref Range Status  03/20/2022 142 <150 mg/dL Final         Passed - Cr in normal range and within 360 days    Creat  Date Value Ref Range Status  03/20/2022 0.86 0.60 - 1.00 mg/dL Final         Passed - Patient is not pregnant      Passed - Valid encounter within last 12 months    Recent Outpatient Visits           4 weeks ago Encounter for general adult medical examination with abnormal findings   [place] [facility] [place], Salvadore Oxford, NP   1 month ago Poison ivy dermatitis   [place] Smitty Cords, DO   2 months ago Post-viral cough syndrome   [place] [facility] [place], [place] W, NP   2 months ago Seasonal allergic rhinitis due to pollen   [facility] [place], Salvadore Oxford, NP   3 months ago Subacute cough   [facility] [place], Salvadore Oxford, NP       Future Appointments             In 5 months Baity, Salvadore Oxford, NP [place] [facility], [facility]

## 2022-11-26 ENCOUNTER — Encounter: Payer: Self-pay | Admitting: Internal Medicine

## 2022-12-06 ENCOUNTER — Other Ambulatory Visit: Payer: Self-pay | Admitting: Internal Medicine

## 2022-12-08 NOTE — Telephone Encounter (Signed)
Requested Prescriptions  Pending Prescriptions Disp Refills   alendronate (FOSAMAX) 70 MG tablet [Pharmacy Med Name: ALENDRONATE 70MG  TABLETS] 12 tablet 1    Sig: TAKE 1 TABLET(70 MG) BY MOUTH 1 TIME A WEEK WITH A FULL GLASS OF WATER AND ON AN EMPTY STOMACH     Endocrinology:  Bisphosphonates Failed - 12/06/2022  5:27 PM      Failed - Vitamin D in normal range and within 360 days    No results found for: "ZO1096EA5", "WU9811BJ4", "NW295AO1HYQ", "25OHVITD3", "25OHVITD2", "25OHVITD1", "VD25OH"       Failed - Mg Level in normal range and within 360 days    Magnesium  Date Value Ref Range Status  05/30/2021 2.2 1.5 - 2.5 mg/dL Final         Failed - Phosphate in normal range and within 360 days    No results found for: "PHOS"       Failed - Bone Mineral Density or Dexa Scan completed in the last 2 years      Passed - Ca in normal range and within 360 days    Calcium  Date Value Ref Range Status  03/20/2022 10.0 8.6 - 10.4 mg/dL Final         Passed - Cr in normal range and within 360 days    Creat  Date Value Ref Range Status  03/20/2022 0.86 0.60 - 1.00 mg/dL Final         Passed - eGFR is 30 or above and within 360 days    GFR calc Af Amer  Date Value Ref Range Status  01/24/2018 >60 >60 mL/min Final    Comment:    (NOTE) The eGFR has been calculated using the CKD EPI equation. This calculation has not been validated in all clinical situations. eGFR's persistently <60 mL/min signify possible Chronic Kidney Disease.    GFR, Estimated  Date Value Ref Range Status  06/06/2021 >60 >60 mL/min Final    Comment:    (NOTE) Calculated using the CKD-EPI Creatinine Equation (2021)    eGFR  Date Value Ref Range Status  03/20/2022 72 > OR = 60 mL/min/1.57m2 Final         Passed - Valid encounter within last 12 months    Recent Outpatient Visits           2 months ago Encounter for general adult medical examination with abnormal findings   [place] [facility] [place], Salvadore Oxford, NP   3 months ago Poison ivy dermatitis   [place] [facility] Smitty Cords, DO   3 months ago Post-viral cough syndrome   [place] [facility] [place], [place] W, NP   4 months ago Seasonal allergic rhinitis due to pollen   [facility] [employer], Salvadore Oxford, NP   4 months ago Subacute cough   [place] [facility] [place], Salvadore Oxford, NP       Future Appointments             In 3 months Baity, Salvadore Oxford, NP  [facility]             amLODipine (NORVASC) 2.5 MG tablet [Pharmacy Med Name: AMLODIPINE BESYLATE 2.5MG  TABLETS] 90 tablet 0    Sig: TAKE 1 TABLET(2.5 MG) BY MOUTH DAILY     Cardiovascular: Calcium Channel Blockers 2 Passed - 12/06/2022  5:27 PM      Passed -  Last BP in normal range    BP Readings from Last 1 Encounters:  09/29/22 122/74         Passed - Last Heart Rate in normal range    Pulse Readings from Last 1 Encounters:  09/29/22 (!) 57         Passed - Valid encounter within last 6 months    Recent Outpatient Visits           2 months ago Encounter for general adult medical examination with abnormal findings   [place] [facility] [place], Salvadore Oxford, NP   3 months ago Poison ivy dermatitis   [place] [facility] Smitty Cords, DO   3 months ago Post-viral cough syndrome   [place] [facility] [place], [place] W, NP   4 months ago Seasonal allergic rhinitis due to pollen   [facility] [place], Salvadore Oxford, NP   4 months ago Subacute cough   [place] [facility] [place], Salvadore Oxford, NP       Future Appointments             In 3 months Baity, Salvadore Oxford, NP [place] [facility], [facility]

## 2022-12-08 NOTE — Telephone Encounter (Signed)
Requested medications are due for refill today.  yes  Requested medications are on the active medications list.  yes  Last refill. 11/28/2/023 #12 1 rf  Future visit scheduled.   yes  Notes to clinic.  Missing/expired labs.    Requested Prescriptions  Pending Prescriptions Disp Refills   alendronate (FOSAMAX) 70 MG tablet [Pharmacy Med Name: ALENDRONATE 70MG  TABLETS] 12 tablet 1    Sig: TAKE 1 TABLET(70 MG) BY MOUTH 1 TIME A WEEK WITH A FULL GLASS OF WATER AND ON AN EMPTY STOMACH     Endocrinology:  Bisphosphonates Failed - 12/06/2022  5:27 PM      Failed - Vitamin D in normal range and within 360 days    No results found for: "KG4010UV2", "ZD6644IH4", "VQ259DG3OVF", "25OHVITD3", "25OHVITD2", "25OHVITD1", "VD25OH"       Failed - Mg Level in normal range and within 360 days    Magnesium  Date Value Ref Range Status  05/30/2021 2.2 1.5 - 2.5 mg/dL Final         Failed - Phosphate in normal range and within 360 days    No results found for: "PHOS"       Failed - Bone Mineral Density or Dexa Scan completed in the last 2 years      Passed - Ca in normal range and within 360 days    Calcium  Date Value Ref Range Status  03/20/2022 10.0 8.6 - 10.4 mg/dL Final         Passed - Cr in normal range and within 360 days    Creat  Date Value Ref Range Status  03/20/2022 0.86 0.60 - 1.00 mg/dL Final         Passed - eGFR is 30 or above and within 360 days    GFR calc Af Amer  Date Value Ref Range Status  01/24/2018 >60 >60 mL/min Final    Comment:    (NOTE) The eGFR has been calculated using the CKD EPI equation. This calculation has not been validated in all clinical situations. eGFR's persistently <60 mL/min signify possible Chronic Kidney Disease.    GFR, Estimated  Date Value Ref Range Status  06/06/2021 >60 >60 mL/min Final    Comment:    (NOTE) Calculated using the CKD-EPI Creatinine Equation (2021)    eGFR  Date Value Ref Range Status  03/20/2022 72 > OR = 60  mL/min/1.8m2 Final         Passed - Valid encounter within last 12 months    Recent Outpatient Visits           2 months ago Encounter for general adult medical examination with abnormal findings   [place] [facility] [place], Salvadore Oxford, NP   3 months ago Poison ivy dermatitis   [place] [facility] Smitty Cords, DO   3 months ago Post-viral cough syndrome   [place] [facility] [place], [place] W, NP   4 months ago Seasonal allergic rhinitis due to pollen   [facility] [place], Salvadore Oxford, NP   4 months ago Subacute cough   [place] [place], Salvadore Oxford, NP       Future Appointments             In 3 months Baity, Salvadore Oxford, NP [facility], [facility]            Signed Prescriptions Disp Refills  amLODipine (NORVASC) 2.5 MG tablet 90 tablet 1    Sig: TAKE 1 TABLET(2.5 MG) BY MOUTH DAILY     Cardiovascular: Calcium Channel Blockers 2 Passed - 12/06/2022  5:27 PM      Passed - Last BP in normal range    BP Readings from Last 1 Encounters:  09/29/22 122/74         Passed - Last Heart Rate in normal range    Pulse Readings from Last 1 Encounters:  09/29/22 (!) 57         Passed - Valid encounter within last 6 months    Recent Outpatient Visits           2 months ago Encounter for general adult medical examination with abnormal findings   [place] [facility] [place], Salvadore Oxford, NP   3 months ago Poison ivy dermatitis   [place] [facility] [place] Smitty Cords, DO   3 months ago Post-viral cough syndrome   [place] [facility] [place], [place], NP   4 months ago Seasonal allergic rhinitis due to pollen   [facility] [place], Salvadore Oxford, NP   4 months ago Subacute cough   [place] [facility] [place], Salvadore Oxford, NP       Future  Appointments             In 3 months Baity, Salvadore Oxford, NP [place] [facility], [facility]

## 2023-01-30 DIAGNOSIS — H401123 Primary open-angle glaucoma, left eye, severe stage: Secondary | ICD-10-CM | POA: Diagnosis not present

## 2023-01-30 DIAGNOSIS — H401111 Primary open-angle glaucoma, right eye, mild stage: Secondary | ICD-10-CM | POA: Diagnosis not present

## 2023-01-30 DIAGNOSIS — Z961 Presence of intraocular lens: Secondary | ICD-10-CM | POA: Diagnosis not present

## 2023-01-30 DIAGNOSIS — Z0100 Encounter for examination of eyes and vision without abnormal findings: Secondary | ICD-10-CM | POA: Diagnosis not present

## 2023-01-30 DIAGNOSIS — Z9889 Other specified postprocedural states: Secondary | ICD-10-CM | POA: Diagnosis not present

## 2023-01-31 ENCOUNTER — Other Ambulatory Visit: Payer: Self-pay | Admitting: Internal Medicine

## 2023-01-31 ENCOUNTER — Other Ambulatory Visit: Payer: Self-pay | Admitting: Family Medicine

## 2023-01-31 DIAGNOSIS — L237 Allergic contact dermatitis due to plants, except food: Secondary | ICD-10-CM

## 2023-01-31 DIAGNOSIS — R058 Other specified cough: Secondary | ICD-10-CM

## 2023-02-03 NOTE — Telephone Encounter (Signed)
Requested medication (s) are due for refill today: routing for review  Requested medication (s) are on the active medication list: yes  Last refill:  09/01/22  Future visit scheduled: yes  Notes to clinic:  Unable to refill per protocol, cannot delegate.      Requested Prescriptions  Pending Prescriptions Disp Refills   triamcinolone cream (KENALOG) 0.1 % [Pharmacy Med Name: TRIAMCINOLONE 0.1% CREAM   30GM] 30 g 0    Sig: APPLY TOPICALLY TO THE AFFECTED AREA TWICE DAILY FOR 1 TO 2 WEEKS AS NEEDED FOR RASH     Not Delegated - Dermatology:  Corticosteroids Failed - 01/31/2023 12:31 PM      Failed - This refill cannot be delegated      Passed - Valid encounter within last 12 months    Recent Outpatient Visits           4 months ago Encounter for general adult medical examination with abnormal findings   [place] [place], Salvadore Oxford, NP   5 months ago Poison ivy dermatitis   [place] [facility] Smitty Cords, DO   5 months ago Post-viral cough syndrome   [place] [facility] [place], [place] W, NP   6 months ago Seasonal allergic rhinitis due to pollen   [facility] [place], Salvadore Oxford, NP   6 months ago Subacute cough   [place] [facility] [place], Salvadore Oxford, NP       Future Appointments             In 1 month [place], Salvadore Oxford, NP [place] [facility], [facility]

## 2023-02-03 NOTE — Telephone Encounter (Signed)
Requested by interface surescripts. Medication discontinued 08/12/22.  Requested Prescriptions  Refused Prescriptions Disp Refills   promethazine-dextromethorphan (PROMETHAZINE-DM) 6.25-15 MG/5ML syrup [Pharmacy Med Name: PROMETHAZINE DM SYRUP] 118 mL 0    Sig: TAKE 5 ML BY MOUTH FOUR TIMES DAILY AS NEEDED FOR COUGH     Ear, Nose, and Throat:  Antitussives/Expectorants Passed - 01/31/2023 12:31 PM      Passed - Valid encounter within last 12 months    Recent Outpatient Visits           4 months ago Encounter for general adult medical examination with abnormal findings   [place] [facility] [place], Salvadore Oxford, NP   5 months ago Poison ivy dermatitis   [place] Smitty Cords, DO   5 months ago Post-viral cough syndrome   [place] [facility] [place], [place], NP   6 months ago Seasonal allergic rhinitis due to pollen   [facility] [place], Salvadore Oxford, NP   6 months ago Subacute cough   [place] [facility] [place], Salvadore Oxford, NP       Future Appointments             In 1 month [place], Salvadore Oxford, NP [place] [facility], [facility]

## 2023-02-27 ENCOUNTER — Ambulatory Visit: Payer: Medicare HMO

## 2023-03-03 ENCOUNTER — Ambulatory Visit (INDEPENDENT_AMBULATORY_CARE_PROVIDER_SITE_OTHER): Payer: Medicare HMO | Admitting: Internal Medicine

## 2023-03-03 ENCOUNTER — Encounter: Payer: Self-pay | Admitting: Internal Medicine

## 2023-03-03 VITALS — BP 132/64 | HR 53 | Temp 95.7°F | Wt 160.0 lb

## 2023-03-03 DIAGNOSIS — R195 Other fecal abnormalities: Secondary | ICD-10-CM

## 2023-03-03 DIAGNOSIS — R140 Abdominal distension (gaseous): Secondary | ICD-10-CM | POA: Diagnosis not present

## 2023-03-03 DIAGNOSIS — R194 Change in bowel habit: Secondary | ICD-10-CM | POA: Diagnosis not present

## 2023-03-03 MED ORDER — ALIGN 4 MG PO CAPS: 1.0000 | ORAL_CAPSULE | Freq: Every day | ORAL | 1 refills | Status: DC

## 2023-03-03 NOTE — Progress Notes (Signed)
Subjective:    Patient ID: Anita Mathews, female    DOB: 12/25/1950, 72 y.o.   MRN: [id number]  HPI  Discussed the use of AI scribe software for clinical note transcription with the patient, who gave verbal consent to proceed.  History of Present Illness   The patient presents with a four to five month history of altered bowel habits. They report a change from regular, formed stools to loose, frequent stools. The patient describes the stools as very loose, sometimes associated with gas, and has had an incident of fecal incontinence. The frequency of bowel movements has increased, sometimes necessitating multiple visits to the bathroom within an hour. The patient denies any associated nausea, vomiting, or cramping, but reports occasional bloating and rare episodes of sweating during bowel movements.  The patient denies any significant changes in diet or medication around the time the symptoms started, although they did stop taking fish oil. They have not noticed any significant changes in the color or odor of the stool. The patient is not currently taking any laxatives, stool softeners, or fiber supplements. They recently switched from taking lecithin and B12 separately to a B complex, which they noticed made their urine dark. They have since reduced the dose of the B complex and the dark urine seems to resolve by midday.  The patient has not traveled outside of the [place] recently and denies any other new medications or supplements. They have not tried any over-the-counter remedies for their symptoms, such as Imodium or probiotics.    Review of Systems  Past Medical History:  Diagnosis Date   Arthritis    Glaucoma     Current Outpatient Medications  Medication Sig Dispense Refill   acetaminophen (TYLENOL) 500 MG tablet Take by mouth.     alendronate (FOSAMAX) 70 MG tablet TAKE 1 TABLET(70 MG) BY MOUTH 1 TIME A WEEK WITH A FULL GLASS OF WATER AND ON AN EMPTY STOMACH 12 tablet 1    amLODipine (NORVASC) 2.5 MG tablet TAKE 1 TABLET(2.5 MG) BY MOUTH DAILY 90 tablet 1   Biotin 1 MG CAPS Take 1 capsule by mouth daily.     Cholecalciferol (VITAMIN D-1000 MAX ST) 25 MCG (1000 UT) tablet Take by mouth.     cyanocobalamin 100 MCG tablet      dorzolamide-timolol (COSOPT) 22.3-6.8 MG/ML ophthalmic solution Apply to eye.     ferrous sulfate 325 (65 FE) MG tablet Take by mouth.     hydrALAZINE (APRESOLINE) 25 MG tablet TAKE 1 TABLET BY MOUTH THREE TIMES DAILY AS NEEDED FOR SYSTOLIC BLOOD PRESSURE 270 tablet 0   hydrocortisone 2.5 % cream Apply topically.     hydrOXYzine (ATARAX) 10 MG tablet Take 1-2 tablets (10-20 mg total) by mouth 3 (three) times daily as needed for itching. 30 tablet 0   ibuprofen (ADVIL) 200 MG tablet Take by mouth.     ketoconazole (NIZORAL) 2 % cream Apply 1 Application topically daily. 30 g 0   Omega-3 Fatty Acids (FISH OIL) 1000 MG CAPS Take 1 capsule by mouth daily.     OPTIVE 0.5-0.9 % ophthalmic solution SMARTSIG:1 Drop(s) In Eye(s) PRN     rosuvastatin (CRESTOR) 5 MG tablet TAKE 1 TABLET(5 MG) BY MOUTH DAILY 90 tablet 1   Secukinumab (COSENTYX) 150 MG/ML SOSY Inject 150 mg into the skin every 30 (thirty) days.     triamcinolone cream (KENALOG) 0.1 % APPLY TOPICALLY TO THE AFFECTED AREA TWICE DAILY FOR 1 TO 2 WEEKS AS NEEDED FOR  RASH 30 g 0   No current facility-administered medications for this visit.    No Known Allergies  Family History  Problem Relation Age of Onset   Breast cancer Mother 32   Breast cancer Paternal Grandmother     Social History   Socioeconomic History   Marital status: Divorced    Spouse name: Not on file   Number of children: Not on file   Years of education: Not on file   Highest education level: Doctorate  Occupational History   Not on file  Tobacco Use   Smoking status: Never   Smokeless tobacco: Never  Vaping Use   Vaping status: Never Used  Substance and Sexual Activity   Alcohol use: Yes    Comment:  occasional   Drug use: Never   Sexual activity: Not on file  Other Topics Concern   Not on file  Social History Narrative   Not on file   Social Determinants of Health   Financial Resource Strain: Low Risk  (09/25/2022)   Overall Financial Resource Strain (CARDIA)    Difficulty of Paying Living Expenses: Not very hard  Food Insecurity: No Food Insecurity (09/25/2022)   Hunger Vital Sign    Worried About Running Out of Food in the Last Year: Never true    Ran Out of Food in the Last Year: Never true  Transportation Needs: No Transportation Needs (09/25/2022)   PRAPARE - Administrator, Civil Service (Medical): No    Lack of Transportation (Non-Medical): No  Physical Activity: Unknown (09/25/2022)   Exercise Vital Sign    Days of Exercise per Week: Patient declined    Minutes of Exercise per Session: 30 min  Stress: No Stress Concern Present (09/25/2022)   [employer] of Occupational Health - Occupational Stress Questionnaire    Feeling of Stress : Only a little  Social Connections: Unknown (09/25/2022)   Social Connection and Isolation Panel [NHANES]    Frequency of Communication with Friends and Family: More than three times a week    Frequency of Social Gatherings with Friends and Family: Three times a week    Attends Religious Services: Patient declined    Active Member of Clubs or Organizations: No    Attends Banker Meetings: Never    Marital Status: Divorced  Catering manager Violence: Not At Risk (04/04/2022)   Humiliation, Afraid, Rape, and Kick questionnaire    Fear of Current or Ex-Partner: No    Emotionally Abused: No    Physically Abused: No    Sexually Abused: No     Constitutional: Denies fever, malaise, fatigue, headache or abrupt weight changes.  HEENT: Denies eye pain, eye redness, ear pain, ringing in the ears, wax buildup, runny nose, nasal congestion, bloody nose, or sore throat. Respiratory: Denies difficulty breathing,  shortness of breath, cough or sputum production.   Cardiovascular: Denies chest pain, chest tightness, palpitations or swelling in the hands or feet.  Gastrointestinal: Pt reports abdominal bloating, gassiness and diarrhea. Denies abdominal pain, constipation, or blood in the stool.  GU: Denies urgency, frequency, pain with urination, burning sensation, blood in urine, odor or discharge.  No other specific complaints in a complete review of systems (except as listed in HPI above).     Objective:   Physical Exam  BP 132/64 (BP Location: Right Arm, Patient Position: Sitting, Cuff Size: Normal)   Pulse (!) 53   Temp (!) 95.7 F (35.4 C) (Temporal)   Wt 160 lb (72.6 kg)  SpO2 100%   BMI 27.68 kg/m   Wt Readings from Last 3 Encounters:  09/29/22 158 lb (71.7 kg)  09/01/22 162 lb (73.5 kg)  08/12/22 162 lb (73.5 kg)    General: Appears her stated age, overweight, in NAD. Skin: Warm, dry and intact.  Cardiovascular: Bradycardic with normal rhythm. S1,S2 noted.  No murmur, rubs or gallops noted. No JVD or BLE edema. No carotid bruits noted. Pulmonary/Chest: Normal effort and positive vesicular breath sounds. No respiratory distress. No wheezes, rales or ronchi noted.  Abdomen: Soft and nontender. Normal bowel sounds. No distention or masses noted. Liver, spleen and kidneys non palpable. Musculoskeletal:  No difficulty with gait.  Neurological: Alert and oriented.   BMET    Component Value Date/Time   NA 140 03/20/2022 1343   K 4.0 03/20/2022 1343   CL 104 03/20/2022 1343   CO2 27 03/20/2022 1343   GLUCOSE 119 03/20/2022 1343   BUN 14 03/20/2022 1343   CREATININE 0.86 03/20/2022 1343   CALCIUM 10.0 03/20/2022 1343   GFRNONAA >60 06/06/2021 2244   GFRAA >60 01/24/2018 1056    Lipid Panel     Component Value Date/Time   CHOL 170 03/20/2022 1343   TRIG 142 03/20/2022 1343   HDL 72 03/20/2022 1343   CHOLHDL 2.4 03/20/2022 1343   LDLCALC 75 03/20/2022 1343    CBC     Component Value Date/Time   WBC 6.8 03/20/2022 1343   RBC 4.34 03/20/2022 1343   HGB 13.1 03/20/2022 1343   HCT 38.6 03/20/2022 1343   PLT 321 03/20/2022 1343   MCV 88.9 03/20/2022 1343   MCH 30.2 03/20/2022 1343   MCHC 33.9 03/20/2022 1343   RDW 12.4 03/20/2022 1343   LYMPHSABS 2.2 01/24/2018 1056   MONOABS 0.9 01/24/2018 1056   EOSABS 0.1 01/24/2018 1056   BASOSABS 0.1 01/24/2018 1056    Hgb A1C No results found for: "HGBA1C"         Assessment & Plan:   Assessment and Plan    Chronic Diarrhea Loose stools for the past 4-5 months with increased frequency and urgency. No associated nausea, vomiting, or cramping. No changes in diet or medications around the time symptoms started. No recent travel. No blood in stool. -Order liver function tests, lipasee, and H. pylori breath test. -Start probiotic (Align) once daily. -If labs are normal, hold Fosamax for a month to assess for improvement. -If symptoms persist, consider referral to GI for possible early colonoscopy.         RTC in 1 month for follow-up of chronic conditions Nicki Reaper, NP

## 2023-03-03 NOTE — Patient Instructions (Signed)
Food Choices to Help Relieve Diarrhea, Adult Diarrhea can make you feel weak and cause you to become dehydrated. Dehydration is a condition in which there is not enough water or other fluids in the body. It is important to choose the right foods and drinks to: Relieve diarrhea. Replace lost fluids and nutrients. Prevent dehydration. What are tips for following this plan? Relieving diarrhea Avoid foods that make your diarrhea worse. These may include: Foods and drinks that are sweetened with high-fructose corn syrup, honey, or sweeteners such as xylitol, sorbitol, and mannitol. Check food labels for these ingredients. Fried, greasy, or spicy foods. Raw fruits and vegetables. Eat foods that are rich in probiotics. These include foods such as yogurt and fermented milk products. Probiotics can help increase healthy bacteria in your stomach and intestines (gastrointestinal or GI tract). This may help digestion and stop diarrhea. If you have lactose intolerance, avoid dairy products. These may make your diarrhea worse. Take medicine to help stop diarrhea only as told by your health care provider. Replacing nutrients  Eat bland, easy-to-digest foods in small amounts as you are able, until your diarrhea starts to get better. These foods include bananas, applesauce, rice, toast, and crackers. Over time, add nutrient-rich foods as your body tolerates them or as told by your health care provider. These include: Well-cooked protein foods, such as eggs, lean meats like fish or chicken without skin, and tofu. Peeled, seeded, and soft-cooked fruits and vegetables. Low-fat dairy products. Whole grains. Take vitamin and mineral supplements as told by your health care provider. Preventing dehydration  Start by sipping water or a solution to prevent dehydration (oral rehydration solution, or ORS). This is a drink that helps replace fluids and minerals your body has lost. You can buy an ORS at pharmacies and  retail stores. Try to drink at least 8-10 cups (2,000-2,500 mL) of fluid each day to help replace lost fluids. If your urine is pale yellow, you are getting enough fluids. You may drink other liquids in addition to water, such as fruit juice that you have added water to (diluted fruit juice) or low-calorie sports drinks, as tolerated or as told by your health care provider. Avoid drinks with caffeine, such as coffee, tea, or soft drinks. Avoid alcohol. This information is not intended to replace advice given to you by your health care provider. Make sure you discuss any questions you have with your health care provider. Document Revised: 11/05/2021 Document Reviewed: 11/05/2021 Elsevier Patient Education  2024 [employer].

## 2023-03-04 LAB — COMPLETE METABOLIC PANEL WITH GFR
AG Ratio: 1.9 (calc) (ref 1.0–2.5)
ALT: 14 U/L (ref 6–29)
AST: 18 U/L (ref 10–35)
Albumin: 4.6 g/dL (ref 3.6–5.1)
Alkaline phosphatase (APISO): 85 U/L (ref 37–153)
BUN: 10 mg/dL (ref 7–25)
CO2: 28 mmol/L (ref 20–32)
Calcium: 9.9 mg/dL (ref 8.6–10.4)
Chloride: 104 mmol/L (ref 98–110)
Creat: 0.91 mg/dL (ref 0.60–1.00)
Globulin: 2.4 g/dL (ref 1.9–3.7)
Glucose, Bld: 67 mg/dL (ref 65–139)
Potassium: 4.8 mmol/L (ref 3.5–5.3)
Sodium: 139 mmol/L (ref 135–146)
Total Bilirubin: 0.5 mg/dL (ref 0.2–1.2)
Total Protein: 7 g/dL (ref 6.1–8.1)
eGFR: 67 mL/min/{1.73_m2} (ref >=60)

## 2023-03-04 LAB — LIPASE: Lipase: 41 U/L (ref 7–60)

## 2023-03-04 LAB — H. PYLORI BREATH TEST: H. pylori Breath Test: NOT DETECTED

## 2023-03-11 DIAGNOSIS — Z1211 Encounter for screening for malignant neoplasm of colon: Secondary | ICD-10-CM | POA: Diagnosis not present

## 2023-03-11 DIAGNOSIS — Z124 Encounter for screening for malignant neoplasm of cervix: Secondary | ICD-10-CM | POA: Diagnosis not present

## 2023-03-12 ENCOUNTER — Encounter: Payer: Self-pay | Admitting: Internal Medicine

## 2023-03-12 ENCOUNTER — Ambulatory Visit (INDEPENDENT_AMBULATORY_CARE_PROVIDER_SITE_OTHER): Payer: Medicare HMO

## 2023-03-12 ENCOUNTER — Ambulatory Visit: Payer: Medicare HMO

## 2023-03-12 DIAGNOSIS — Z23 Encounter for immunization: Secondary | ICD-10-CM

## 2023-03-19 DIAGNOSIS — L409 Psoriasis, unspecified: Secondary | ICD-10-CM | POA: Diagnosis not present

## 2023-03-19 DIAGNOSIS — M150 Primary generalized (osteo)arthritis: Secondary | ICD-10-CM | POA: Diagnosis not present

## 2023-03-19 DIAGNOSIS — Z7960 Long term (current) use of unspecified immunomodulators and immunosuppressants: Secondary | ICD-10-CM | POA: Diagnosis not present

## 2023-03-19 DIAGNOSIS — Z1211 Encounter for screening for malignant neoplasm of colon: Secondary | ICD-10-CM | POA: Diagnosis not present

## 2023-03-19 DIAGNOSIS — L4050 Arthropathic psoriasis, unspecified: Secondary | ICD-10-CM | POA: Diagnosis not present

## 2023-03-23 DIAGNOSIS — Z1211 Encounter for screening for malignant neoplasm of colon: Secondary | ICD-10-CM | POA: Diagnosis not present

## 2023-03-27 ENCOUNTER — Other Ambulatory Visit: Payer: Self-pay

## 2023-03-27 DIAGNOSIS — E782 Mixed hyperlipidemia: Secondary | ICD-10-CM

## 2023-03-27 MED ORDER — ROSUVASTATIN CALCIUM 5 MG PO TABS: 5.0000 mg | ORAL_TABLET | Freq: Every day | ORAL | 1 refills | Status: DC

## 2023-03-31 ENCOUNTER — Other Ambulatory Visit: Payer: Self-pay

## 2023-03-31 DIAGNOSIS — E782 Mixed hyperlipidemia: Secondary | ICD-10-CM

## 2023-03-31 MED ORDER — ROSUVASTATIN CALCIUM 5 MG PO TABS: 5.0000 mg | ORAL_TABLET | Freq: Every day | ORAL | 1 refills | Status: DC

## 2023-04-02 ENCOUNTER — Encounter: Payer: Self-pay | Admitting: Internal Medicine

## 2023-04-02 ENCOUNTER — Ambulatory Visit (INDEPENDENT_AMBULATORY_CARE_PROVIDER_SITE_OTHER): Payer: Medicare HMO | Admitting: Internal Medicine

## 2023-04-02 VITALS — BP 124/74 | Ht 63.75 in | Wt 160.0 lb

## 2023-04-02 DIAGNOSIS — E782 Mixed hyperlipidemia: Secondary | ICD-10-CM | POA: Diagnosis not present

## 2023-04-02 DIAGNOSIS — L409 Psoriasis, unspecified: Secondary | ICD-10-CM | POA: Diagnosis not present

## 2023-04-02 DIAGNOSIS — M810 Age-related osteoporosis without current pathological fracture: Secondary | ICD-10-CM

## 2023-04-02 DIAGNOSIS — M150 Primary generalized (osteo)arthritis: Secondary | ICD-10-CM | POA: Diagnosis not present

## 2023-04-02 DIAGNOSIS — E663 Overweight: Secondary | ICD-10-CM

## 2023-04-02 DIAGNOSIS — R739 Hyperglycemia, unspecified: Secondary | ICD-10-CM | POA: Diagnosis not present

## 2023-04-02 DIAGNOSIS — I83813 Varicose veins of bilateral lower extremities with pain: Secondary | ICD-10-CM

## 2023-04-02 DIAGNOSIS — Z6827 Body mass index (BMI) 27.0-27.9, adult: Secondary | ICD-10-CM

## 2023-04-02 DIAGNOSIS — L4050 Arthropathic psoriasis, unspecified: Secondary | ICD-10-CM | POA: Diagnosis not present

## 2023-04-02 DIAGNOSIS — I10 Essential (primary) hypertension: Secondary | ICD-10-CM

## 2023-04-02 MED ORDER — HYDROCORTISONE 2.5 % EX CREA: TOPICAL_CREAM | Freq: Two times a day (BID) | CUTANEOUS | 1 refills | Status: AC

## 2023-04-02 NOTE — Assessment & Plan Note (Signed)
Continue triamcinolone cream as needed 

## 2023-04-02 NOTE — Progress Notes (Signed)
Subjective:    Patient ID: Anita Mathews, female    DOB: [birth year], 72 y.o.   MRN: [id number]  HPI  Patient presents to clinic today for 27-month follow-up of chronic conditions.  HTN: Her BP today is 124/74.  She is taking amlodipine as prescribed.  ECG from 07/2021 reviewed.  HLD: Her last LDL was 75, triglycerides 829, 03/2022.  She denies myalgias on rosuvastatin.  She tries to consume a low-fat diet.  Psoriatic arthritis/OA: Mainly in her hands. Managed with cosentyx.  She follows with rheumatology.  Osteoporosis: She stopped taking alendronate due to diarrhea.  She tries to get weightbearing exercise.  Bone density from 03/2022 (care everywhere) reviewed.  Psoriasis: Managed with triamcinolone cream as needed.  She is not currently following with dermatology.  Hemorrhoids: She has been having some difficulty with this lately.  She would like a refill of her hydrocortisone cream.  She does not follow with GI.  Varicose veins: She denies any itching, pain or ulceration.  She is not taking any medication for this at this time.  Review of Systems     Past Medical History:  Diagnosis Date   Arthritis    Glaucoma     Current Outpatient Medications  Medication Sig Dispense Refill   acetaminophen (TYLENOL) 500 MG tablet Take by mouth.     alendronate (FOSAMAX) 70 MG tablet TAKE 1 TABLET(70 MG) BY MOUTH 1 TIME A WEEK WITH A FULL GLASS OF WATER AND ON AN EMPTY STOMACH 12 tablet 1   amLODipine (NORVASC) 2.5 MG tablet TAKE 1 TABLET(2.5 MG) BY MOUTH DAILY 90 tablet 1   Biotin 1 MG CAPS Take 1 capsule by mouth daily.     Cholecalciferol (VITAMIN D-1000 MAX ST) 25 MCG (1000 UT) tablet Take by mouth.     cyanocobalamin 100 MCG tablet      dorzolamide-timolol (COSOPT) 22.3-6.8 MG/ML ophthalmic solution Apply to eye.     ferrous sulfate 325 (65 FE) MG tablet Take by mouth.     hydrALAZINE (APRESOLINE) 25 MG tablet TAKE 1 TABLET BY MOUTH THREE TIMES DAILY AS NEEDED FOR SYSTOLIC BLOOD  PRESSURE 270 tablet 0   hydrocortisone 2.5 % cream Apply topically.     hydrOXYzine (ATARAX) 10 MG tablet Take 1-2 tablets (10-20 mg total) by mouth 3 (three) times daily as needed for itching. 30 tablet 0   ibuprofen (ADVIL) 200 MG tablet Take by mouth.     ketoconazole (NIZORAL) 2 % cream Apply 1 Application topically daily. 30 g 0   OPTIVE 0.5-0.9 % ophthalmic solution SMARTSIG:1 Drop(s) In Eye(s) PRN     Probiotic Product (ALIGN) 4 MG CAPS Take 1 capsule (4 mg total) by mouth daily. 90 capsule 1   rosuvastatin (CRESTOR) 5 MG tablet Take 1 tablet (5 mg total) by mouth daily. TAKE 1 TABLET(5 MG) BY MOUTH DAILY 90 tablet 1   Secukinumab (COSENTYX) 150 MG/ML SOSY Inject 150 mg into the skin every 30 (thirty) days.     triamcinolone cream (KENALOG) 0.1 % APPLY TOPICALLY TO THE AFFECTED AREA TWICE DAILY FOR 1 TO 2 WEEKS AS NEEDED FOR RASH 30 g 0   No current facility-administered medications for this visit.    No Known Allergies  Family History  Problem Relation Age of Onset   Breast cancer Mother 42   Breast cancer Paternal Grandmother     Social History   Socioeconomic History   Marital status: Divorced    Spouse name: Not on file   Number  of children: Not on file   Years of education: Not on file   Highest education level: Doctorate  Occupational History   Not on file  Tobacco Use   Smoking status: Never   Smokeless tobacco: Never  Vaping Use   Vaping status: Never Used  Substance and Sexual Activity   Alcohol use: Yes    Comment: occasional   Drug use: Never   Sexual activity: Not on file  Other Topics Concern   Not on file  Social History Narrative   Not on file   Social Determinants of Health   Financial Resource Strain: Low Risk  (04/01/2023)   Overall Financial Resource Strain (CARDIA)    Difficulty of Paying Living Expenses: Not very hard  Food Insecurity: No Food Insecurity (04/01/2023)   Hunger Vital Sign    Worried About Running Out of Food in the Last  Year: Never true    Ran Out of Food in the Last Year: Never true  Transportation Needs: No Transportation Needs (04/01/2023)   PRAPARE - Administrator, Civil Service (Medical): No    Lack of Transportation (Non-Medical): No  Physical Activity: Insufficiently Active (04/01/2023)   Exercise Vital Sign    Days of Exercise per Week: 2 days    Minutes of Exercise per Session: 20 min  Stress: No Stress Concern Present (04/01/2023)   [employer] of Occupational Health - Occupational Stress Questionnaire    Feeling of Stress : Only a little  Social Connections: Socially Isolated (04/01/2023)   Social Connection and Isolation Panel [NHANES]    Frequency of Communication with Friends and Family: Twice a week    Frequency of Social Gatherings with Friends and Family: Three times a week    Attends Religious Services: Never    Active Member of Clubs or Organizations: No    Attends Banker Meetings: Never    Marital Status: Divorced  Catering manager Violence: Not At Risk (04/04/2022)   Humiliation, Afraid, Rape, and Kick questionnaire    Fear of Current or Ex-Partner: No    Emotionally Abused: No    Physically Abused: No    Sexually Abused: No     Constitutional: Denies fever, malaise, fatigue, headache or abrupt weight changes.  HEENT: Denies eye pain, eye redness, ear pain, ringing in the ears, wax buildup, runny nose, nasal congestion, bloody nose, or sore throat. Respiratory: Denies difficulty breathing, shortness of breath, cough or sputum production.   Cardiovascular: Denies chest pain, chest tightness, palpitations or swelling in the hands or feet.  Gastrointestinal: Patient reports hemorrhoids.  Denies abdominal pain, bloating, constipation, diarrhea or blood in the stool.  GU: Denies urgency, frequency, pain with urination, burning sensation, blood in urine, odor or discharge. Musculoskeletal: Patient reports joint pain.  Denies decrease in range of  motion, difficulty with gait, muscle pain or joint swelling.  Skin: Patient reports intermittent rash of face and ears.  Denies lesions or ulcercations.  Neurological: Denies dizziness, difficulty with memory, difficulty with speech or problems with balance and coordination.  Psych: Denies anxiety, depression, SI/HI.  No other specific complaints in a complete review of systems (except as listed in HPI above).  Objective:   Physical Exam BP 124/74   Ht 5' 3.75" (1.619 m)   Wt 160 lb (72.6 kg)   BMI 27.68 kg/m   Wt Readings from Last 3 Encounters:  03/03/23 160 lb (72.6 kg)  09/29/22 158 lb (71.7 kg)  09/01/22 162 lb (73.5 kg)  General: Appears her stated age, overweight, in NAD. Skin: Warm, dry and intact.  Linear striations noted bilateral nails.  No rashes, lesions or ulcerations noted. HEENT: Head: normal shape and size; Eyes: sclera white, no icterus, conjunctiva pink, PERRLA and EOMs intact;  Cardiovascular: Normal rate and rhythm. S1,S2 noted.  No murmur, rubs or gallops noted. No JVD or BLE edema. No carotid bruits noted. Pulmonary/Chest: Normal effort and positive vesicular breath sounds. No respiratory distress. No wheezes, rales or ronchi noted.  Musculoskeletal: No joint enlargement noted.  No difficulty with gait.  Neurological: Alert and oriented.  Coordination normal.    BMET    Component Value Date/Time   NA 139 03/03/2023 0953   K 4.8 03/03/2023 0953   CL 104 03/03/2023 0953   CO2 28 03/03/2023 0953   GLUCOSE 67 03/03/2023 0953   BUN 10 03/03/2023 0953   CREATININE 0.91 03/03/2023 0953   CALCIUM 9.9 03/03/2023 0953   GFRNONAA >60 06/06/2021 2244   GFRAA >60 01/24/2018 1056    Lipid Panel     Component Value Date/Time   CHOL 170 03/20/2022 1343   TRIG 142 03/20/2022 1343   HDL 72 03/20/2022 1343   CHOLHDL 2.4 03/20/2022 1343   LDLCALC 75 03/20/2022 1343    CBC    Component Value Date/Time   WBC 6.8 03/20/2022 1343   RBC 4.34 03/20/2022 1343    HGB 13.1 03/20/2022 1343   HCT 38.6 03/20/2022 1343   PLT 321 03/20/2022 1343   MCV 88.9 03/20/2022 1343   MCH 30.2 03/20/2022 1343   MCHC 33.9 03/20/2022 1343   RDW 12.4 03/20/2022 1343   LYMPHSABS 2.2 01/24/2018 1056   MONOABS 0.9 01/24/2018 1056   EOSABS 0.1 01/24/2018 1056   BASOSABS 0.1 01/24/2018 1056    Hgb A1C No results found for: "HGBA1C"          Assessment & Plan:    RTC in 6 months for your annual exam Nicki Reaper, NP

## 2023-04-02 NOTE — Assessment & Plan Note (Signed)
Encouraged diet and exercise for weight loss ?

## 2023-04-02 NOTE — Assessment & Plan Note (Signed)
C-Met and lipid profile today  Encouraged her to consume a low-fat diet Continue rosuvastatin 

## 2023-04-02 NOTE — Assessment & Plan Note (Signed)
She will restart alendronate and see if it causes diarrhea again Encouraged daily weightbearing exercise

## 2023-04-02 NOTE — Patient Instructions (Signed)

## 2023-04-02 NOTE — Assessment & Plan Note (Signed)
No itching, pain or ulceration We will monitor

## 2023-04-02 NOTE — Assessment & Plan Note (Signed)
Continues Cosentyx She will continue to follow with rheumatology

## 2023-04-02 NOTE — Assessment & Plan Note (Signed)
Continue Cosentyx She will continue to follow with rheumatology

## 2023-04-02 NOTE — Assessment & Plan Note (Signed)
Controlled on amlodipine Reinforced DASH diet and exercise for weight loss C-Met today 

## 2023-04-03 LAB — CBC
HCT: 38.2 % (ref 35.0–45.0)
Hemoglobin: 12.6 g/dL (ref 11.7–15.5)
MCH: 30.7 pg (ref 27.0–33.0)
MCHC: 33 g/dL (ref 32.0–36.0)
MCV: 92.9 fL (ref 80.0–100.0)
MPV: 10.4 fL (ref 7.5–12.5)
Platelets: 278 10*3/uL (ref 140–400)
RBC: 4.11 10*6/uL (ref 3.80–5.10)
RDW: 12.6 % (ref 11.0–15.0)
WBC: 4.7 10*3/uL (ref 3.8–10.8)

## 2023-04-03 LAB — LIPID PANEL
Cholesterol: 157 mg/dL (ref <200)
HDL: 79 mg/dL (ref >=50)
LDL Cholesterol (Calc): 58 mg/dL
Non-HDL Cholesterol (Calc): 78 mg/dL (ref <130)
Total CHOL/HDL Ratio: 2 (calc) (ref <5.0)
Triglycerides: 115 mg/dL (ref <150)

## 2023-04-03 LAB — HEMOGLOBIN A1C
Hgb A1c MFr Bld: 5.5 %{Hb} (ref <5.7)
Mean Plasma Glucose: 111 mg/dL
eAG (mmol/L): 6.2 mmol/L

## 2023-04-09 DIAGNOSIS — H401133 Primary open-angle glaucoma, bilateral, severe stage: Secondary | ICD-10-CM | POA: Diagnosis not present

## 2023-04-12 ENCOUNTER — Other Ambulatory Visit: Payer: Self-pay | Admitting: Medical Genetics

## 2023-04-12 DIAGNOSIS — Z006 Encounter for examination for normal comparison and control in clinical research program: Secondary | ICD-10-CM

## 2023-04-16 DIAGNOSIS — R936 Abnormal findings on diagnostic imaging of limbs: Secondary | ICD-10-CM | POA: Diagnosis not present

## 2023-04-16 DIAGNOSIS — M19012 Primary osteoarthritis, left shoulder: Secondary | ICD-10-CM | POA: Diagnosis not present

## 2023-04-16 DIAGNOSIS — Z96612 Presence of left artificial shoulder joint: Secondary | ICD-10-CM | POA: Diagnosis not present

## 2023-04-17 ENCOUNTER — Ambulatory Visit (INDEPENDENT_AMBULATORY_CARE_PROVIDER_SITE_OTHER): Payer: Medicare HMO

## 2023-04-17 ENCOUNTER — Encounter: Payer: Self-pay | Admitting: Internal Medicine

## 2023-04-17 DIAGNOSIS — Z0000 Encounter for general adult medical examination without abnormal findings: Secondary | ICD-10-CM | POA: Diagnosis not present

## 2023-04-17 NOTE — Patient Instructions (Addendum)
Anita Mathews , Thank you for taking time to come for your Medicare Wellness Visit. I appreciate your ongoing commitment to your health goals. Please review the following plan we discussed and let me know if I can assist you in the future.   Referrals/Orders/Follow-Ups/Clinician Recommendations: none  This is a list of the screening recommended for you and due dates:  Health Maintenance  Topic Date Due   Zoster (Shingles) Vaccine (1 of 2) 12/22/1969   DTaP/Tdap/Td vaccine (2 - Td or Tdap) 12/28/2022   Mammogram  08/27/2023   Medicare Annual Wellness Visit  04/16/2024   Colon Cancer Screening  04/07/2029   Pneumonia Vaccine  Completed   Flu Shot  Completed   DEXA scan (bone density measurement)  Completed   Hepatitis C Screening  Completed   HPV Vaccine  Aged Out   COVID-19 Vaccine  Discontinued    Advanced directives: (ACP Link)Information on Advanced Care Planning can be found at [facility] [employer] Advance Health Care Directives Advance Health Care Directives ([email or web address]   Next Medicare Annual Wellness Visit scheduled for next year: Yes  04/22/24 @ 2:40 pm by video

## 2023-04-17 NOTE — Progress Notes (Signed)
Subjective:   Anita Mathews is a 72 y.o. female who presents for Medicare Annual (Subsequent) preventive examination.  Visit Complete: Virtual I connected with  Anita Mathews on 04/17/23 by a audio enabled telemedicine application and verified that I am speaking with the correct person using two identifiers.  Patient Location: Home  Provider Location: Office/Clinic  I discussed the limitations of evaluation and management by telemedicine. The patient expressed understanding and agreed to proceed.  Vital Signs: Because this visit was a virtual/telehealth visit, some criteria may be missing or patient reported. Any vitals not documented were not able to be obtained and vitals that have been documented are patient reported.  Cardiac Risk Factors include: advanced age (>67men, >40 women);dyslipidemia;hypertension     Objective:    There were no vitals filed for this visit. There is no height or weight on file to calculate BMI.     04/17/2023    3:16 PM 04/04/2022    3:47 PM 06/07/2021    7:50 PM 06/06/2021   10:41 PM 12/12/2020   11:41 AM 07/18/2020    1:53 PM 01/24/2018   10:37 AM  Advanced Directives  Does Patient Have a Medical Advance Directive? No No No No No No No  Would patient like information on creating a medical advance directive? No - Patient declined No - Patient declined     No - Patient declined    Current Medications (verified) Outpatient Encounter Medications as of 04/17/2023  Medication Sig   acetaminophen (TYLENOL) 500 MG tablet Take by mouth.   amLODipine (NORVASC) 2.5 MG tablet TAKE 1 TABLET(2.5 MG) BY MOUTH DAILY   Biotin 1 MG CAPS Take 1 capsule by mouth daily.   Cholecalciferol (VITAMIN D-1000 MAX ST) 25 MCG (1000 UT) tablet Take by mouth.   cyanocobalamin 100 MCG tablet    dorzolamide-timolol (COSOPT) 22.3-6.8 MG/ML ophthalmic solution Apply to eye.   hydrocortisone 2.5 % cream Apply topically 2 (two) times daily.   ketoconazole (NIZORAL) 2 % cream  Apply 1 Application topically daily.   OPTIVE 0.5-0.9 % ophthalmic solution SMARTSIG:1 Drop(s) In Eye(s) PRN   rosuvastatin (CRESTOR) 5 MG tablet Take 1 tablet (5 mg total) by mouth daily. TAKE 1 TABLET(5 MG) BY MOUTH DAILY   Secukinumab (COSENTYX) 150 MG/ML SOSY Inject 150 mg into the skin every 30 (thirty) days.   triamcinolone cream (KENALOG) 0.1 % APPLY TOPICALLY TO THE AFFECTED AREA TWICE DAILY FOR 1 TO 2 WEEKS AS NEEDED FOR RASH   ibuprofen (ADVIL) 200 MG tablet Take by mouth. (Patient not taking: Reported on 04/17/2023)   Probiotic Product (ALIGN) 4 MG CAPS Take 1 capsule (4 mg total) by mouth daily.   No facility-administered encounter medications on file as of 04/17/2023.    Allergies (verified) Patient has no active allergies.   History: Past Medical History:  Diagnosis Date   Arthritis    Glaucoma    Past Surgical History:  Procedure Laterality Date   CESAREAN SECTION     EYE SURGERY     SHOULDER SURGERY Left    Family History  Problem Relation Age of Onset   Breast cancer Mother 37   Breast cancer Paternal Grandmother    Social History   Socioeconomic History   Marital status: Divorced    Spouse name: Not on file   Number of children: Not on file   Years of education: Not on file   Highest education level: Doctorate  Occupational History   Not on file  Tobacco  Use   Smoking status: Never   Smokeless tobacco: Never  Vaping Use   Vaping status: Never Used  Substance and Sexual Activity   Alcohol use: Yes    Comment: occasional   Drug use: Never   Sexual activity: Not on file  Other Topics Concern   Not on file  Social History Narrative   Not on file   Social Determinants of Health   Financial Resource Strain: Low Risk  (04/17/2023)   Overall Financial Resource Strain (CARDIA)    Difficulty of Paying Living Expenses: Not hard at all  Food Insecurity: No Food Insecurity (04/17/2023)   Hunger Vital Sign    Worried About Running Out of Food in the  Last Year: Never true    Ran Out of Food in the Last Year: Never true  Transportation Needs: No Transportation Needs (04/17/2023)   PRAPARE - Administrator, Civil Service (Medical): No    Lack of Transportation (Non-Medical): No  Physical Activity: Sufficiently Active (04/17/2023)   Exercise Vital Sign    Days of Exercise per Week: 3 days    Minutes of Exercise per Session: 60 min  Recent Concern: Physical Activity - Insufficiently Active (04/01/2023)   Exercise Vital Sign    Days of Exercise per Week: 2 days    Minutes of Exercise per Session: 20 min  Stress: No Stress Concern Present (04/17/2023)   [employer] of Occupational Health - Occupational Stress Questionnaire    Feeling of Stress : Only a little  Social Connections: Socially Isolated (04/17/2023)   Social Connection and Isolation Panel [NHANES]    Frequency of Communication with Friends and Family: More than three times a week    Frequency of Social Gatherings with Friends and Family: Three times a week    Attends Religious Services: Never    Active Member of Clubs or Organizations: No    Attends Banker Meetings: Never    Marital Status: Divorced    Tobacco Counseling Counseling given: Not Answered   Clinical Intake:  Pre-visit preparation completed: Yes  Pain : No/denies pain     BMI - recorded: 27.7 Nutritional Status: BMI 25 -29 Overweight Nutritional Risks: None Diabetes: No  How often do you need to have someone help you when you read instructions, pamphlets, or other written materials from your doctor or pharmacy?: 1 - Never  Interpreter Needed?: No  Information entered by :: Kennedy Bucker, LPN   Activities of Daily Living    04/17/2023    3:17 PM 04/14/2023    9:39 AM  In your present state of health, do you have any difficulty performing the following activities:  Hearing? 1 1  Vision? 0 1  Difficulty concentrating or making decisions? 0 0  Walking or  climbing stairs? 0 0  Dressing or bathing? 0 0  Doing errands, shopping? 0 0  Preparing Food and eating ? N N  Using the Toilet? N N  In the past six months, have you accidently leaked urine? N Y  Do you have problems with loss of bowel control? N N  Managing your Medications? N N  Managing your Finances? N N  Housekeeping or managing your Housekeeping? N N    Patient Care Team: Lorre Munroe, NP as PCP - General (Internal Medicine) [place] Crane, MD as Consulting Physician (Ophthalmology)  Indicate any recent Medical Services you may have received from other than [facility] providers in the past year (date may be approximate).  Assessment:   This is a routine wellness examination for Jaonna.  Hearing/Vision screen Hearing Screening - Comments:: Wears aid, both ears Vision Screening - Comments:: Wears glasses to drive- Dr.King   Goals Addressed             This Visit's Progress    DIET - INCREASE WATER INTAKE         Depression Screen    04/17/2023    3:14 PM 03/03/2023    9:55 AM 09/29/2022    9:32 AM 07/07/2022    3:25 PM 04/04/2022    3:45 PM 09/05/2021    2:59 PM 08/08/2021    9:27 AM  PHQ 2/9 Scores  PHQ - 2 Score 0 0 0 0 0 0 0  PHQ- 9 Score 0    0 1 1    Fall Risk    04/17/2023    3:17 PM 04/14/2023    9:39 AM 03/03/2023    9:56 AM 09/29/2022    9:33 AM 07/07/2022    3:25 PM  Fall Risk   Falls in the past year? 0 0 0 0 1  Number falls in past yr: 0 0   0  Injury with Fall? 0 0 0 0 0  Risk for fall due to : No Fall Risks  No Fall Risks No Fall Risks No Fall Risks  Follow up Falls prevention discussed;Falls evaluation completed        MEDICARE RISK AT HOME: Medicare Risk at Home Any stairs in or around the home?: Yes If so, are there any without handrails?: No Home free of loose throw rugs in walkways, pet beds, electrical cords, etc?: Yes Adequate lighting in your home to reduce risk of falls?: Yes Life alert?: No Use of a cane, walker or w/c?:  No Grab bars in the bathroom?: Yes Shower chair or bench in shower?: No Elevated toilet seat or a handicapped toilet?: No  TIMED UP AND GO:  Was the test performed?  No    Cognitive Function:        04/17/2023    3:19 PM 04/04/2022    3:55 PM  6CIT Screen  What Year? 0 points 0 points  What month? 0 points 0 points  What time? 0 points 0 points  Count back from 20 0 points 0 points  Months in reverse 0 points 0 points  Repeat phrase 0 points 2 points  Total Score 0 points 2 points    Immunizations Immunization History  Administered Date(s) Administered   Fluad Quad(high Dose 65+) 03/20/2021, 04/18/2022   Fluad Trivalent(High Dose 65+) 03/12/2023   Influenza Inj Mdck Quad Pf 03/19/2018, 03/04/2019   Influenza, High Dose Seasonal PF 05/07/2016, 03/27/2017   Influenza,inj,Quad PF,6+ Mos 04/17/2014, 04/06/2015, 03/21/2020   PFIZER Comirnaty(Gray Top)Covid-19 Tri-Sucrose Vaccine 07/04/2019, 07/26/2019, 03/27/2020, 11/09/2020   PPD Test 08/25/2016, 08/02/2019   Pneumococcal Conjugate-13 05/29/2017   Pneumococcal Polysaccharide-23 01/20/2019   Rsv, Bivalent, Protein Subunit Rsvpref,pf Verdis Frederickson) 03/11/2022   Tdap 12/27/2012   Zoster, Unspecified 11/23/2022    TDAP status: Due, Education has been provided regarding the importance of this vaccine. Advised may receive this vaccine at local pharmacy or Health Dept. Aware to provide a copy of the vaccination record if obtained from local pharmacy or Health Dept. Verbalized acceptance and understanding.  Flu Vaccine status: Up to date  Pneumococcal vaccine status: Up to date  Covid-19 vaccine status: Completed vaccines  Qualifies for Shingles Vaccine? Yes   Zostavax completed No   Shingrix Completed?:  Yes  Screening Tests Health Maintenance  Topic Date Due   Zoster Vaccines- Shingrix (1 of 2) 12/22/1969   DTaP/Tdap/Td (2 - Td or Tdap) 12/28/2022   MAMMOGRAM  08/27/2023   Medicare Annual Wellness (AWV)  04/16/2024    Colonoscopy  04/07/2029   Pneumonia Vaccine 70+ Years old  Completed   INFLUENZA VACCINE  Completed   DEXA SCAN  Completed   Hepatitis C Screening  Completed   HPV VACCINES  Aged Out   COVID-19 Vaccine  Discontinued    Health Maintenance  Health Maintenance Due  Topic Date Due   Zoster Vaccines- Shingrix (1 of 2) 12/22/1969   DTaP/Tdap/Td (2 - Td or Tdap) 12/28/2022    Colorectal cancer screening: Type of screening: Colonoscopy. Completed 04/08/19. Repeat every 5 years  Mammogram status: Completed 08/27/22. Repeat every year  Bone Density status: Completed 04/09/20. Results reflect: Bone density results: OSTEOPOROSIS. Repeat every 2 years.- had one last year per patient  Lung Cancer Screening: (Low Dose CT Chest recommended if Age 53-80 years, 20 pack-year currently smoking OR have quit w/in 15years.) does not qualify.    Additional Screening:  Hepatitis C Screening: does qualify; Completed 03/20/21  Vision Screening: Recommended annual ophthalmology exams for early detection of glaucoma and other disorders of the eye. Is the patient up to date with their annual eye exam?  Yes  Who is the provider or what is the name of the office in which the patient attends annual eye exams? [facility] If pt is not established with a provider, would they like to be referred to a provider to establish care? No .   Dental Screening: Recommended annual dental exams for proper oral hygiene   Community Resource Referral / Chronic Care Management: CRR required this visit?  No   CCM required this visit?  No     Plan:     I have personally reviewed and noted the following in the patient's chart:   Medical and social history Use of alcohol, tobacco or illicit drugs  Current medications and supplements including opioid prescriptions. Patient is not currently taking opioid prescriptions. Functional ability and status Nutritional status Physical activity Advanced directives List of other  physicians Hospitalizations, surgeries, and ER visits in previous 12 months Vitals Screenings to include cognitive, depression, and falls Referrals and appointments  In addition, I have reviewed and discussed with patient certain preventive protocols, quality metrics, and best practice recommendations. A written personalized care plan for preventive services as well as general preventive health recommendations were provided to patient.     Hal Hope, LPN   [id number]   After Visit Summary: (MyChart) Due to this being a telephonic visit, the after visit summary with patients personalized plan was offered to patient via MyChart   Nurse Notes: none

## 2023-04-23 ENCOUNTER — Other Ambulatory Visit: Payer: Self-pay | Attending: Medical Genetics

## 2023-06-19 ENCOUNTER — Other Ambulatory Visit: Payer: Self-pay | Admitting: Internal Medicine

## 2023-06-19 NOTE — Telephone Encounter (Signed)
Requested Prescriptions  Pending Prescriptions Disp Refills   alendronate (FOSAMAX) 70 MG tablet [Pharmacy Med Name: ALENDRONATE 70MG  TABLETS] 12 tablet 1    Sig: TAKE 1 TABLET(70 MG) BY MOUTH 1 TIME A WEEK WITH A FULL GLASS OF WATER AND ON AN EMPTY STOMACH     Endocrinology:  Bisphosphonates Failed - 06/19/2023  4:04 PM      Failed - Vitamin D in normal range and within 360 days    No results found for: "MW4132GM0", "NU2725DG6", "YQ034VQ2VZD", "25OHVITD3", "25OHVITD2", "25OHVITD1", "VD25OH"       Failed - Mg Level in normal range and within 360 days    Magnesium  Date Value Ref Range Status  05/30/2021 2.2 1.5 - 2.5 mg/dL Final         Failed - Phosphate in normal range and within 360 days    No results found for: "PHOS"       Failed - Bone Mineral Density or Dexa Scan completed in the last 2 years      Passed - Ca in normal range and within 360 days    Calcium  Date Value Ref Range Status  03/03/2023 9.9 8.6 - 10.4 mg/dL Final         Passed - Cr in normal range and within 360 days    Creat  Date Value Ref Range Status  03/03/2023 0.91 0.60 - 1.00 mg/dL Final         Passed - eGFR is 30 or above and within 360 days    GFR calc Af Amer  Date Value Ref Range Status  01/24/2018 >60 >60 mL/min Final    Comment:    (NOTE) The eGFR has been calculated using the CKD EPI equation. This calculation has not been validated in all clinical situations. eGFR's persistently <60 mL/min signify possible Chronic Kidney Disease.    GFR, Estimated  Date Value Ref Range Status  06/06/2021 >60 >60 mL/min Final    Comment:    (NOTE) Calculated using the CKD-EPI Creatinine Equation (2021)    eGFR  Date Value Ref Range Status  03/03/2023 67 > OR = 60 mL/min/1.68m2 Final         Passed - Valid encounter within last 12 months    Recent Outpatient Visits           2 months ago Mixed hyperlipidemia   [place] [facility] [place], [place] W, NP   3 months ago Change  in bowel habits   [facility] [place], Salvadore Oxford, NP   8 months ago Encounter for general adult medical examination with abnormal findings   [place] [facility] [place], Salvadore Oxford, NP   9 months ago Poison ivy dermatitis   [place] [facility] [place] Cords, DO   10 months ago Post-viral cough syndrome    [facility] [place], Salvadore Oxford, NP       Future Appointments             In 3 months Baity, Salvadore Oxford, NP  [facility]             amLODipine (NORVASC) 2.5 MG tablet [Pharmacy Med Name: AMLODIPINE BESYLATE 2.5MG  TABLETS] 90 tablet 0    Sig: TAKE 1 TABLET(2.5 MG) BY MOUTH DAILY     Cardiovascular: Calcium Channel Blockers 2 Passed - 06/19/2023  4:04 PM      Passed - Last BP  in normal range    BP Readings from Last 1 Encounters:  04/02/23 124/74         Passed - Last Heart Rate in normal range    Pulse Readings from Last 1 Encounters:  03/03/23 (!) 53         Passed - Valid encounter within last 6 months    Recent Outpatient Visits           2 months ago Mixed hyperlipidemia   [place] [facility] [place], [place] W, NP   3 months ago Change in bowel habits   [place] [facility] [place], Salvadore Oxford, NP   8 months ago Encounter for general adult medical examination with abnormal findings   [place] [facility] [place], Salvadore Oxford, NP   9 months ago Poison ivy dermatitis   [place] [facility] Smit[place] Cords, DO   10 months ago Post-viral cough syndrome   [place] [facility] [place], Salvadore Oxford, NP       Future Appointments             In 3 months Baity, Salvadore Oxford, NP [place] [facility]

## 2023-06-22 ENCOUNTER — Ambulatory Visit (INDEPENDENT_AMBULATORY_CARE_PROVIDER_SITE_OTHER): Payer: Medicare HMO | Admitting: Internal Medicine

## 2023-06-22 VITALS — BP 128/68 | Ht 63.75 in | Wt 163.8 lb

## 2023-06-22 DIAGNOSIS — J069 Acute upper respiratory infection, unspecified: Secondary | ICD-10-CM | POA: Diagnosis not present

## 2023-06-22 MED ORDER — PREDNISONE 10 MG PO TABS: ORAL_TABLET | ORAL | 0 refills | Status: DC

## 2023-06-22 NOTE — Progress Notes (Signed)
Subjective:    Patient ID: Anita Mathews, female    DOB: [birth year], 73 y.o.   MRN: [id number]  HPI  Discussed the use of AI scribe software for clinical note transcription with the patient, who gave verbal consent to proceed.   The patient, with a history of allergies and a previous COVID-19 infection, presents with a three-day history of sinus pressure, eye pressure, and nasal congestion. She reports minimal headache, which started on the day of the consultation. The patient also describes a sensation of pressure when bending over. She denies significant rhinorrhea, stating that she only blows her nose in the morning. However, she notes increased lacrimation, particularly in one eye, which has been ongoing for a few days.  The patient reports a minimal cough, which she describes as a 'clear your throat' type of cough, rather than a deep, congested cough. She denies any gastrointestinal symptoms, attributing loose stools to a recent increase in vegetable intake. She also denies any fever, chills, or body aches, and has not taken her temperature.  The patient is unsure about potential exposure to COVID-19 or influenza. She recalls a previous asymptomatic COVID-19 infection, which was diagnosed through testing. She has been managing her symptoms with extra-strength Tylenol, but refrained from taking it on the day of the consultation to avoid masking symptoms.  The patient has a history of allergies, typically presenting as rhinorrhea or postnasal drip during this time of the year. She has dogs at home and wonders if she has developed an allergy to her [place] terrier. However, she has had the dogs for five years without any overt symptoms.  The patient is due to travel to [place] to visit her 74 year old mother and is concerned about potentially spreading an infection.       Review of Systems   Past Medical History:  Diagnosis Date   Arthritis    Glaucoma     Current Outpatient  Medications  Medication Sig Dispense Refill   acetaminophen (TYLENOL) 500 MG tablet Take by mouth.     amLODipine (NORVASC) 2.5 MG tablet TAKE 1 TABLET(2.5 MG) BY MOUTH DAILY 90 tablet 0   Biotin 1 MG CAPS Take 1 capsule by mouth daily.     Cholecalciferol (VITAMIN D-1000 MAX ST) 25 MCG (1000 UT) tablet Take by mouth.     cyanocobalamin 100 MCG tablet      dorzolamide-timolol (COSOPT) 22.3-6.8 MG/ML ophthalmic solution Apply to eye.     hydrocortisone 2.5 % cream Apply topically 2 (two) times daily. 30 g 1   ibuprofen (ADVIL) 200 MG tablet Take by mouth. (Patient not taking: Reported on 04/17/2023)     ketoconazole (NIZORAL) 2 % cream Apply 1 Application topically daily. 30 g 0   OPTIVE 0.5-0.9 % ophthalmic solution SMARTSIG:1 Drop(s) In Eye(s) PRN     Probiotic Product (ALIGN) 4 MG CAPS Take 1 capsule (4 mg total) by mouth daily. 90 capsule 1   rosuvastatin (CRESTOR) 5 MG tablet Take 1 tablet (5 mg total) by mouth daily. TAKE 1 TABLET(5 MG) BY MOUTH DAILY 90 tablet 1   Secukinumab (COSENTYX) 150 MG/ML SOSY Inject 150 mg into the skin every 30 (thirty) days.     triamcinolone cream (KENALOG) 0.1 % APPLY TOPICALLY TO THE AFFECTED AREA TWICE DAILY FOR 1 TO 2 WEEKS AS NEEDED FOR RASH 30 g 0   No current facility-administered medications for this visit.    No Active Allergies  Family History  Problem Relation Age of Onset  Breast cancer Mother 26   Breast cancer Paternal Grandmother     Social History   Socioeconomic History   Marital status: Divorced    Spouse name: Not on file   Number of children: Not on file   Years of education: Not on file   Highest education level: Doctorate  Occupational History   Not on file  Tobacco Use   Smoking status: Never   Smokeless tobacco: Never  Vaping Use   Vaping status: Never Used  Substance and Sexual Activity   Alcohol use: Yes    Comment: occasional   Drug use: Never   Sexual activity: Not on file  Other Topics Concern   Not on  file  Social History Narrative   Not on file   Social Drivers of Health   Financial Resource Strain: Low Risk  (06/22/2023)   Overall Financial Resource Strain (CARDIA)    Difficulty of Paying Living Expenses: Not very hard  Food Insecurity: No Food Insecurity (06/22/2023)   Hunger Vital Sign    Worried About Running Out of Food in the Last Year: Never true    Ran Out of Food in the Last Year: Never true  Transportation Needs: No Transportation Needs (06/22/2023)   PRAPARE - Administrator, Civil Service (Medical): No    Lack of Transportation (Non-Medical): No  Physical Activity: Sufficiently Active (06/22/2023)   Exercise Vital Sign    Days of Exercise per Week: 3 days    Minutes of Exercise per Session: 60 min  Recent Concern: Physical Activity - Insufficiently Active (04/01/2023)   Exercise Vital Sign    Days of Exercise per Week: 2 days    Minutes of Exercise per Session: 20 min  Stress: No Stress Concern Present (06/22/2023)   [employer] of Occupational Health - Occupational Stress Questionnaire    Feeling of Stress : Only a little  Social Connections: Moderately Isolated (06/22/2023)   Social Connection and Isolation Panel [NHANES]    Frequency of Communication with Friends and Family: Three times a week    Frequency of Social Gatherings with Friends and Family: Three times a week    Attends Religious Services: Never    Active Member of Clubs or Organizations: Yes    Attends Banker Meetings: Never    Marital Status: Divorced  Catering manager Violence: Not At Risk (04/17/2023)   Humiliation, Afraid, Rape, and Kick questionnaire    Fear of Current or Ex-Partner: No    Emotionally Abused: No    Physically Abused: No    Sexually Abused: No     Constitutional: Pt reports headache. Denies fever, malaise, fatigue, or abrupt weight changes.  HEENT: Pt reports sinus pressure, eye pressure, and nasal congestion. Denies eye pain, eye redness, ear  pain, ringing in the ears, wax buildup, runny nose, nasal bloody nose, or sore throat. Respiratory: Pt reports cough. Denies difficulty breathing, shortness of breath, or sputum production.   Cardiovascular: Denies chest pain, chest tightness, palpitations or swelling in the hands or feet.  Gastrointestinal: Denies abdominal pain, bloating, constipation, diarrhea or blood in the stool.  Musculoskeletal: Denies decrease in range of motion, difficulty with gait, muscle pain or joint pain and swelling.  Skin: Denies redness, rashes, lesions or ulcercations.  Neurological: Denies dizziness, difficulty with memory, difficulty with speech or problems with balance and coordination.    No other specific complaints in a complete review of systems (except as listed in HPI above).  Objective:   Physical Exam BP 128/68 (BP Location: Right Arm, Patient Position: Sitting, Cuff Size: Normal)   Ht 5' 3.75" (1.619 m)   Wt 163 lb 12.8 oz (74.3 kg)   BMI 28.34 kg/m   Wt Readings from Last 3 Encounters:  04/02/23 160 lb (72.6 kg)  03/03/23 160 lb (72.6 kg)  09/29/22 158 lb (71.7 kg)    General: Appears her stated age, overweight, in NAD. Skin: Warm, dry and intact. No rashes noted. HEENT: Head: normal shape and size, no sinus tenderness noted; Eyes: sclera white, no icterus, conjunctiva pink, PERRLA and EOMs intact, increased tearing bilaterally;  Nose: mucosa pink and moist, turbinates swollen 1; Throat/Mouth: Teeth present, mucosa pink and moist, + PND, no exudate, lesions or ulcerations noted.  Neck:  No adenopathy noted. Cardiovascular: Normal rate and rhythm.  Pulmonary/Chest: Normal effort and positive vesicular breath sounds. No respiratory distress. No wheezes, rales or ronchi noted.  Neurological: Alert and oriented.   BMET    Component Value Date/Time   NA 139 03/03/2023 0953   K 4.8 03/03/2023 0953   CL 104 03/03/2023 0953   CO2 28 03/03/2023 0953   GLUCOSE 67 03/03/2023 0953    BUN 10 03/03/2023 0953   CREATININE 0.91 03/03/2023 0953   CALCIUM 9.9 03/03/2023 0953   GFRNONAA >60 06/06/2021 2244   GFRAA >60 01/24/2018 1056    Lipid Panel     Component Value Date/Time   CHOL 157 04/02/2023 0918   TRIG 115 04/02/2023 0918   HDL 79 04/02/2023 0918   CHOLHDL 2.0 04/02/2023 0918   LDLCALC 58 04/02/2023 0918    CBC    Component Value Date/Time   WBC 4.7 04/02/2023 0918   RBC 4.11 04/02/2023 0918   HGB 12.6 04/02/2023 0918   HCT 38.2 04/02/2023 0918   PLT 278 04/02/2023 0918   MCV 92.9 04/02/2023 0918   MCH 30.7 04/02/2023 0918   MCHC 33.0 04/02/2023 0918   RDW 12.6 04/02/2023 0918   LYMPHSABS 2.2 01/24/2018 1056   MONOABS 0.9 01/24/2018 1056   EOSABS 0.1 01/24/2018 1056   BASOSABS 0.1 01/24/2018 1056    Hgb A1C Lab Results  Component Value Date   HGBA1C 5.5 04/02/2023            Assessment & Plan:   Assessment and Plan    Viral Upper Respiratory Infection With Cough Viral etiology suspected given the symptoms of sinus pressure, minimal cough, and eye watering. No fever, chills, or body aches reported. -Start Prednisone to reduce sinus pressure and nasal congestion. -Consider over-the-counter Cetirizine (Zyrtec) 10mg  daily for allergy symptoms until return from [place] and a few days after. -Advise patient to practice good hand hygiene and consider wearing a mask around elderly mother if symptoms persist.   RTC in 3 months for your annual exam. Nicki Reaper, NP

## 2023-06-22 NOTE — Patient Instructions (Signed)

## 2023-07-06 DIAGNOSIS — H401123 Primary open-angle glaucoma, left eye, severe stage: Secondary | ICD-10-CM | POA: Diagnosis not present

## 2023-07-09 ENCOUNTER — Ambulatory Visit (INDEPENDENT_AMBULATORY_CARE_PROVIDER_SITE_OTHER): Payer: Medicare HMO | Admitting: Vascular Surgery

## 2023-07-10 DIAGNOSIS — H401123 Primary open-angle glaucoma, left eye, severe stage: Secondary | ICD-10-CM | POA: Diagnosis not present

## 2023-07-10 DIAGNOSIS — H401111 Primary open-angle glaucoma, right eye, mild stage: Secondary | ICD-10-CM | POA: Diagnosis not present

## 2023-07-11 ENCOUNTER — Encounter: Payer: Self-pay | Admitting: Internal Medicine

## 2023-07-13 MED ORDER — ALENDRONATE SODIUM 70 MG PO TABS: 70.0000 mg | ORAL_TABLET | ORAL | 0 refills | Status: DC

## 2023-07-14 ENCOUNTER — Other Ambulatory Visit: Payer: Self-pay | Admitting: Internal Medicine

## 2023-07-14 DIAGNOSIS — Z1231 Encounter for screening mammogram for malignant neoplasm of breast: Secondary | ICD-10-CM

## 2023-07-27 ENCOUNTER — Ambulatory Visit (INDEPENDENT_AMBULATORY_CARE_PROVIDER_SITE_OTHER): Payer: Medicare HMO | Admitting: Nurse Practitioner

## 2023-08-06 ENCOUNTER — Encounter: Payer: Self-pay | Admitting: Internal Medicine

## 2023-08-06 ENCOUNTER — Other Ambulatory Visit: Payer: Self-pay

## 2023-08-06 ENCOUNTER — Ambulatory Visit (INDEPENDENT_AMBULATORY_CARE_PROVIDER_SITE_OTHER): Payer: Medicare HMO | Admitting: Nurse Practitioner

## 2023-08-06 DIAGNOSIS — E782 Mixed hyperlipidemia: Secondary | ICD-10-CM

## 2023-08-06 MED ORDER — ROSUVASTATIN CALCIUM 5 MG PO TABS: 5.0000 mg | ORAL_TABLET | Freq: Every day | ORAL | 1 refills | Status: DC

## 2023-08-20 ENCOUNTER — Ambulatory Visit (INDEPENDENT_AMBULATORY_CARE_PROVIDER_SITE_OTHER): Payer: Medicare HMO | Admitting: Nurse Practitioner

## 2023-08-20 ENCOUNTER — Encounter (INDEPENDENT_AMBULATORY_CARE_PROVIDER_SITE_OTHER): Payer: Self-pay | Admitting: Nurse Practitioner

## 2023-08-20 VITALS — BP 132/63 | HR 60 | Resp 16 | Wt 162.8 lb

## 2023-08-20 DIAGNOSIS — I83813 Varicose veins of bilateral lower extremities with pain: Secondary | ICD-10-CM | POA: Diagnosis not present

## 2023-08-20 DIAGNOSIS — I10 Essential (primary) hypertension: Secondary | ICD-10-CM | POA: Diagnosis not present

## 2023-08-20 DIAGNOSIS — E782 Mixed hyperlipidemia: Secondary | ICD-10-CM

## 2023-08-20 DIAGNOSIS — M150 Primary generalized (osteo)arthritis: Secondary | ICD-10-CM

## 2023-08-20 NOTE — Progress Notes (Signed)
 Subjective:    Patient ID: Anita Mathews, female    DOB: 1951/04/06, 73 y.o.   MRN: [id number] Chief Complaint  Patient presents with   Follow-up    1 yr follow up    The patient returns to the office for followup evaluation regarding leg swelling and varicosities.  The swelling has improved somewhat a bit and the pain associated with swelling has decreased substantially. There have not been any interval development of a ulcerations or wounds.   Since the previous visit the patient has been wearing graduated compression stockings and notes some tenderness under the areas of some spider veins but typically this is not intolerable for her.  She notes that her swelling is much better controlled when she uses her knee-high compression socks.   The patient also states elevation during the day and exercise is being done too.      Review of Systems  Cardiovascular:  Positive for leg swelling.  All other systems reviewed and are negative.      Objective:   Physical Exam Vitals reviewed.  Cardiovascular:     Rate and Rhythm: Normal rate.     Pulses: Normal pulses.  Pulmonary:     Effort: Pulmonary effort is normal.  Musculoskeletal:     Right lower leg: Edema present.     Left lower leg: Edema present.  Skin:    General: Skin is warm and dry.  Neurological:     Mental Status: She is alert and oriented to person, place, and time.  Psychiatric:        Mood and Affect: Mood normal.        Behavior: Behavior normal.        Thought Content: Thought content normal.        Judgment: Judgment normal.     BP 132/63   Pulse 60   Resp 16   Wt 162 lb 12.8 oz (73.8 kg)   BMI 28.16 kg/m   Past Medical History:  Diagnosis Date   Arthritis    Glaucoma     Social History   Socioeconomic History   Marital status: Divorced    Spouse name: Not on file   Number of children: Not on file   Years of education: Not on file   Highest education level: Doctorate  Occupational  History   Not on file  Tobacco Use   Smoking status: Never   Smokeless tobacco: Never  Vaping Use   Vaping status: Never Used  Substance and Sexual Activity   Alcohol use: Yes    Comment: occasional   Drug use: Never   Sexual activity: Not on file  Other Topics Concern   Not on file  Social History Narrative   Not on file   Social Drivers of Health   Financial Resource Strain: Low Risk  (06/22/2023)   Overall Financial Resource Strain (CARDIA)    Difficulty of Paying Living Expenses: Not very hard  Food Insecurity: No Food Insecurity (06/22/2023)   Hunger Vital Sign    Worried About Running Out of Food in the Last Year: Never true    Ran Out of Food in the Last Year: Never true  Transportation Needs: No Transportation Needs (06/22/2023)   PRAPARE - Administrator, Civil Service (Medical): No    Lack of Transportation (Non-Medical): No  Physical Activity: Sufficiently Active (06/22/2023)   Exercise Vital Sign    Days of Exercise per Week: 3 days    Minutes of  Exercise per Session: 60 min  Recent Concern: Physical Activity - Insufficiently Active (04/01/2023)   Exercise Vital Sign    Days of Exercise per Week: 2 days    Minutes of Exercise per Session: 20 min  Stress: No Stress Concern Present (06/22/2023)   [employer] of Occupational Health - Occupational Stress Questionnaire    Feeling of Stress : Only a little  Social Connections: Moderately Isolated (06/22/2023)   Social Connection and Isolation Panel [NHANES]    Frequency of Communication with Friends and Family: Three times a week    Frequency of Social Gatherings with Friends and Family: Three times a week    Attends Religious Services: Never    Active Member of Clubs or Organizations: Yes    Attends Banker Meetings: Never    Marital Status: Divorced  Catering manager Violence: Not At Risk (04/17/2023)   Humiliation, Afraid, Rape, and Kick questionnaire    Fear of Current or  Ex-Partner: No    Emotionally Abused: No    Physically Abused: No    Sexually Abused: No    Past Surgical History:  Procedure Laterality Date   CESAREAN SECTION     EYE SURGERY     SHOULDER SURGERY Left     Family History  Problem Relation Age of Onset   Breast cancer Mother 66   Breast cancer Paternal Grandmother     No Known Allergies     Latest Ref Rng & Units 04/02/2023    9:18 AM 03/20/2022    1:43 PM 06/06/2021   10:44 PM  CBC  WBC 3.8 - 10.8 Thousand/uL 4.7  6.8  6.5   Hemoglobin 11.7 - 15.5 g/dL 16.1  09.6  04.5   Hematocrit 35.0 - 45.0 % 38.2  38.6  37.8   Platelets 140 - 400 Thousand/uL 278  321  286       CMP     Component Value Date/Time   NA 139 03/03/2023 0953   K 4.8 03/03/2023 0953   CL 104 03/03/2023 0953   CO2 28 03/03/2023 0953   GLUCOSE 67 03/03/2023 0953   BUN 10 03/03/2023 0953   CREATININE 0.91 03/03/2023 0953   CALCIUM 9.9 03/03/2023 0953   PROT 7.0 03/03/2023 0953   ALBUMIN 3.9 01/24/2018 1056   AST 18 03/03/2023 0953   ALT 14 03/03/2023 0953   ALKPHOS 56 01/24/2018 1056   BILITOT 0.5 03/03/2023 0953   EGFR 67 03/03/2023 0953   GFRNONAA >60 06/06/2021 2244     No results found.     Assessment & Plan:   1. Varicose veins of both lower extremities with pain (Primary) She continues to have some superficial varicosities more in the popliteal space bilaterally. However at this time the issues are not significant enough to proceed with intervention. Patient is advised to continue with use of conservative therapy including medical grade compression, elevation and activity. Will see the patient in 1 year or sooner if she begins to have worsening issues.   2. Primary hypertension Continue antihypertensive medications as already ordered, these medications have been reviewed and there are no changes at this time.   3. Mixed hyperlipidemia Continue statin as ordered and reviewed, no changes at this time    Current Outpatient  Medications on File Prior to Visit  Medication Sig Dispense Refill   acetaminophen (TYLENOL) 500 MG tablet Take by mouth.     alendronate (FOSAMAX) 70 MG tablet Take 1 tablet (70 mg total) by mouth every  7 (seven) days. Take with a full glass of water on an empty stomach. 12 tablet 0   amLODipine (NORVASC) 2.5 MG tablet TAKE 1 TABLET(2.5 MG) BY MOUTH DAILY 90 tablet 0   Biotin 1 MG CAPS Take 1 capsule by mouth daily.     Cholecalciferol (VITAMIN D-1000 MAX ST) 25 MCG (1000 UT) tablet Take by mouth.     cyanocobalamin 100 MCG tablet      dorzolamide-timolol (COSOPT) 22.3-6.8 MG/ML ophthalmic solution Apply to eye.     hydrocortisone 2.5 % cream Apply topically 2 (two) times daily. 30 g 1   ibuprofen (ADVIL) 200 MG tablet Take by mouth.     ketoconazole (NIZORAL) 2 % cream Apply 1 Application topically daily. 30 g 0   OPTIVE 0.5-0.9 % ophthalmic solution SMARTSIG:1 Drop(s) In Eye(s) PRN     predniSONE (DELTASONE) 10 MG tablet Take 6 tabs on day 1, 5 tabs on day 2, 4 tabs on day 3, 3 tabs on day 4, 2 tabs on day 5, 1 tab on day 6 21 tablet 0   rosuvastatin (CRESTOR) 5 MG tablet Take 1 tablet (5 mg total) by mouth daily. TAKE 1 TABLET(5 MG) BY MOUTH DAILY 90 tablet 1   Secukinumab (COSENTYX) 150 MG/ML SOSY Inject 150 mg into the skin every 30 (thirty) days.     triamcinolone cream (KENALOG) 0.1 % APPLY TOPICALLY TO THE AFFECTED AREA TWICE DAILY FOR 1 TO 2 WEEKS AS NEEDED FOR RASH 30 g 0   No current facility-administered medications on file prior to visit.    There are no Patient Instructions on file for this visit. No follow-ups on file.   Georgiana Spinner, NP

## 2023-09-03 ENCOUNTER — Ambulatory Visit
Admission: RE | Admit: 2023-09-03 | Discharge: 2023-09-03 | Disposition: A | Discharge status: Home / Self Care | Payer: Self-pay | Source: Ambulatory Visit | Attending: Internal Medicine | Admitting: Internal Medicine

## 2023-09-03 DIAGNOSIS — Z1231 Encounter for screening mammogram for malignant neoplasm of breast: Secondary | ICD-10-CM | POA: Insufficient documentation

## 2023-09-13 ENCOUNTER — Encounter: Payer: Self-pay | Admitting: Internal Medicine

## 2023-09-14 ENCOUNTER — Other Ambulatory Visit: Payer: Self-pay

## 2023-09-14 MED ORDER — AMLODIPINE BESYLATE 2.5 MG PO TABS: 2.5000 mg | ORAL_TABLET | Freq: Every day | ORAL | 0 refills | Status: DC

## 2023-09-23 ENCOUNTER — Other Ambulatory Visit (INDEPENDENT_AMBULATORY_CARE_PROVIDER_SITE_OTHER): Payer: Self-pay | Admitting: Nurse Practitioner

## 2023-09-23 DIAGNOSIS — I83813 Varicose veins of bilateral lower extremities with pain: Secondary | ICD-10-CM

## 2023-09-24 ENCOUNTER — Ambulatory Visit (INDEPENDENT_AMBULATORY_CARE_PROVIDER_SITE_OTHER): Admitting: Nurse Practitioner

## 2023-09-24 ENCOUNTER — Ambulatory Visit (INDEPENDENT_AMBULATORY_CARE_PROVIDER_SITE_OTHER)

## 2023-09-24 VITALS — BP 120/67 | HR 46 | Resp 16 | Ht 64.0 in | Wt 164.2 lb

## 2023-09-24 DIAGNOSIS — I83813 Varicose veins of bilateral lower extremities with pain: Secondary | ICD-10-CM

## 2023-09-24 DIAGNOSIS — I10 Essential (primary) hypertension: Secondary | ICD-10-CM | POA: Diagnosis not present

## 2023-09-24 DIAGNOSIS — E782 Mixed hyperlipidemia: Secondary | ICD-10-CM

## 2023-09-27 ENCOUNTER — Encounter (INDEPENDENT_AMBULATORY_CARE_PROVIDER_SITE_OTHER): Payer: Self-pay | Admitting: Nurse Practitioner

## 2023-09-27 NOTE — Progress Notes (Signed)
 Subjective:    Patient ID: Anita Mathews, female    DOB: 1950/11/08, 73 y.o.   MRN: [id number] Chief Complaint  Patient presents with   Venous Insufficiency    The patient returns for followup evaluation of her varicose veins.  The patient continues to have pain in the lower extremities with dependency. The pain is lessened with elevation. Graduated compression stockings, Class I (20-30 mmHg), have been worn but the stockings do not eliminate the leg pain. Over-the-counter analgesics do not improve the symptoms. The degree of discomfort continues to interfere with daily activities. The patient notes the pain in the legs is causing problems with daily exercise, at the workplace and even with household activities and maintenance such as standing in the kitchen preparing meals and doing dishes.   Venous ultrasound shows normal deep venous system, no evidence of acute or chronic DVT.  Superficial reflux is present in the left great saphenous vein.    Review of Systems  Cardiovascular:  Positive for leg swelling.  All other systems reviewed and are negative.      Objective:   Physical Exam Vitals reviewed.  HENT:     Head: Normocephalic.  Cardiovascular:     Rate and Rhythm: Normal rate.     Pulses: Normal pulses.  Pulmonary:     Effort: Pulmonary effort is normal.  Skin:    General: Skin is warm and dry.  Neurological:     Mental Status: She is alert and oriented to person, place, and time.  Psychiatric:        Mood and Affect: Mood normal.        Behavior: Behavior normal.        Thought Content: Thought content normal.        Judgment: Judgment normal.     BP 120/67 (BP Location: Right Arm, Patient Position: Sitting, Cuff Size: Normal)   Pulse (!) 46   Resp 16   Ht 5\' 4"  (1.626 m)   Wt 164 lb 3.2 oz (74.5 kg)   BMI 28.18 kg/m   Past Medical History:  Diagnosis Date   Arthritis    Glaucoma     Social History   Socioeconomic History   Marital status:  Divorced    Spouse name: Not on file   Number of children: Not on file   Years of education: Not on file   Highest education level: Doctorate  Occupational History   Not on file  Tobacco Use   Smoking status: Never   Smokeless tobacco: Never  Vaping Use   Vaping status: Never Used  Substance and Sexual Activity   Alcohol use: Yes    Comment: occasional   Drug use: Never   Sexual activity: Not on file  Other Topics Concern   Not on file  Social History Narrative   Not on file   Social Drivers of Health   Financial Resource Strain: Low Risk  (06/22/2023)   Overall Financial Resource Strain (CARDIA)    Difficulty of Paying Living Expenses: Not very hard  Food Insecurity: No Food Insecurity (06/22/2023)   Hunger Vital Sign    Worried About Running Out of Food in the Last Year: Never true    Ran Out of Food in the Last Year: Never true  Transportation Needs: No Transportation Needs (06/22/2023)   PRAPARE - Administrator, Civil Service (Medical): No    Lack of Transportation (Non-Medical): No  Physical Activity: Sufficiently Active (06/22/2023)   Exercise Vital  Sign    Days of Exercise per Week: 3 days    Minutes of Exercise per Session: 60 min  Recent Concern: Physical Activity - Insufficiently Active (04/01/2023)   Exercise Vital Sign    Days of Exercise per Week: 2 days    Minutes of Exercise per Session: 20 min  Stress: No Stress Concern Present (06/22/2023)   [employer] of Occupational Health - Occupational Stress Questionnaire    Feeling of Stress : Only a little  Social Connections: Moderately Isolated (06/22/2023)   Social Connection and Isolation Panel [NHANES]    Frequency of Communication with Friends and Family: Three times a week    Frequency of Social Gatherings with Friends and Family: Three times a week    Attends Religious Services: Never    Active Member of Clubs or Organizations: Yes    Attends Banker Meetings: Never     Marital Status: Divorced  Catering manager Violence: Not At Risk (04/17/2023)   Humiliation, Afraid, Rape, and Kick questionnaire    Fear of Current or Ex-Partner: No    Emotionally Abused: No    Physically Abused: No    Sexually Abused: No    Past Surgical History:  Procedure Laterality Date   CESAREAN SECTION     EYE SURGERY     SHOULDER SURGERY Left     Family History  Problem Relation Age of Onset   Breast cancer Mother 28   Breast cancer Paternal Grandmother     No Known Allergies     Latest Ref Rng & Units 04/02/2023    9:18 AM 03/20/2022    1:43 PM 06/06/2021   10:44 PM  CBC  WBC 3.8 - 10.8 Thousand/uL 4.7  6.8  6.5   Hemoglobin 11.7 - 15.5 g/dL 27.2  53.6  64.4   Hematocrit 35.0 - 45.0 % 38.2  38.6  37.8   Platelets 140 - 400 Thousand/uL 278  321  286       CMP     Component Value Date/Time   NA 139 03/03/2023 0953   K 4.8 03/03/2023 0953   CL 104 03/03/2023 0953   CO2 28 03/03/2023 0953   GLUCOSE 67 03/03/2023 0953   BUN 10 03/03/2023 0953   CREATININE 0.91 03/03/2023 0953   CALCIUM  9.9 03/03/2023 0953   PROT 7.0 03/03/2023 0953   ALBUMIN 3.9 01/24/2018 1056   AST 18 03/03/2023 0953   ALT 14 03/03/2023 0953   ALKPHOS 56 01/24/2018 1056   BILITOT 0.5 03/03/2023 0953   EGFR 67 03/03/2023 0953   GFRNONAA >60 06/06/2021 2244     No results found.     Assessment & Plan:   1. Varicose veins of both lower extremities with pain (Primary) Recommend  I have reviewed my previous  discussion with the patient regarding  varicose veins and why they cause symptoms. Patient will continue  wearing graduated compression stockings class 1 on a daily basis, beginning first thing in the morning and removing them in the evening.  The patient is CEAP [id number].  The patient has been wearing compression for more than 12 weeks with no or little benefit.  The patient has been exercising daily for more than 12 weeks. The patient has been elevating and taking OTC pain  medications for more than 12 weeks.  None of these have have eliminated the pain related to the varicose veins and venous reflux or the discomfort regarding venous congestion.    In addition, behavioral modification including  elevation during the day was again discussed and this will continue.  The patient has utilized over the counter pain medications and has been exercising.  However, at this time conservative therapy has not alleviated the patient's symptoms of leg pain and swelling  Recommend: laser ablation of the left great saphenous veins to eliminate the symptoms of pain and swelling of the lower extremities caused by the severe superficial venous reflux disease.   2. Primary hypertension Continue antihypertensive medications as already ordered, these medications have been reviewed and there are no changes at this time.  3. Mixed hyperlipidemia Continue statin as ordered and reviewed, no changes at this time   Current Outpatient Medications on File Prior to Visit  Medication Sig Dispense Refill   acetaminophen (TYLENOL) 500 MG tablet Take by mouth.     alendronate  (FOSAMAX ) 70 MG tablet Take 1 tablet (70 mg total) by mouth every 7 (seven) days. Take with a full glass of water on an empty stomach. 12 tablet 0   amLODipine  (NORVASC ) 2.5 MG tablet Take 1 tablet (2.5 mg total) by mouth daily. 90 tablet 0   Biotin 1 MG CAPS Take 1 capsule by mouth daily.     cetirizine (ZYRTEC) 10 MG chewable tablet Chew 10 mg by mouth daily.     Cholecalciferol (VITAMIN D-1000 MAX ST) 25 MCG (1000 UT) tablet Take by mouth.     cyanocobalamin 100 MCG tablet      dorzolamide-timolol (COSOPT) 22.3-6.8 MG/ML ophthalmic solution Apply to eye.     hydrocortisone  2.5 % cream Apply topically 2 (two) times daily. 30 g 1   ketoconazole  (NIZORAL ) 2 % cream Apply 1 Application topically daily. 30 g 0   OPTIVE 0.5-0.9 % ophthalmic solution SMARTSIG:1 Drop(s) In Eye(s) PRN     rosuvastatin  (CRESTOR ) 5 MG tablet  Take 1 tablet (5 mg total) by mouth daily. TAKE 1 TABLET(5 MG) BY MOUTH DAILY 90 tablet 1   Secukinumab (COSENTYX) 150 MG/ML SOSY Inject 150 mg into the skin every 30 (thirty) days.     triamcinolone  cream (KENALOG ) 0.1 % APPLY TOPICALLY TO THE AFFECTED AREA TWICE DAILY FOR 1 TO 2 WEEKS AS NEEDED FOR RASH 30 g 0   ibuprofen (ADVIL) 200 MG tablet Take by mouth. (Patient not taking: Reported on 09/24/2023)     predniSONE  (DELTASONE ) 10 MG tablet Take 6 tabs on day 1, 5 tabs on day 2, 4 tabs on day 3, 3 tabs on day 4, 2 tabs on day 5, 1 tab on day 6 (Patient not taking: Reported on 09/24/2023) 21 tablet 0   No current facility-administered medications on file prior to visit.    There are no Patient Instructions on file for this visit. No follow-ups on file.   Nadav Swindell E Akshaj Besancon, NP

## 2023-10-01 ENCOUNTER — Ambulatory Visit (INDEPENDENT_AMBULATORY_CARE_PROVIDER_SITE_OTHER): Payer: Self-pay | Admitting: Internal Medicine

## 2023-10-01 ENCOUNTER — Encounter: Payer: Self-pay | Admitting: Internal Medicine

## 2023-10-01 VITALS — BP 118/60 | Ht 64.0 in | Wt 161.0 lb

## 2023-10-01 DIAGNOSIS — Z0001 Encounter for general adult medical examination with abnormal findings: Secondary | ICD-10-CM | POA: Diagnosis not present

## 2023-10-01 DIAGNOSIS — E782 Mixed hyperlipidemia: Secondary | ICD-10-CM | POA: Diagnosis not present

## 2023-10-01 DIAGNOSIS — Z6828 Body mass index (BMI) 28.0-28.9, adult: Secondary | ICD-10-CM

## 2023-10-01 DIAGNOSIS — Z780 Asymptomatic menopausal state: Secondary | ICD-10-CM | POA: Diagnosis not present

## 2023-10-01 DIAGNOSIS — E663 Overweight: Secondary | ICD-10-CM

## 2023-10-01 DIAGNOSIS — Z7960 Long term (current) use of unspecified immunomodulators and immunosuppressants: Secondary | ICD-10-CM | POA: Diagnosis not present

## 2023-10-01 DIAGNOSIS — Z1231 Encounter for screening mammogram for malignant neoplasm of breast: Secondary | ICD-10-CM | POA: Diagnosis not present

## 2023-10-01 DIAGNOSIS — L4050 Arthropathic psoriasis, unspecified: Secondary | ICD-10-CM | POA: Diagnosis not present

## 2023-10-01 DIAGNOSIS — R739 Hyperglycemia, unspecified: Secondary | ICD-10-CM

## 2023-10-01 DIAGNOSIS — M150 Primary generalized (osteo)arthritis: Secondary | ICD-10-CM | POA: Diagnosis not present

## 2023-10-01 DIAGNOSIS — L409 Psoriasis, unspecified: Secondary | ICD-10-CM | POA: Diagnosis not present

## 2023-10-01 DIAGNOSIS — Z0100 Encounter for examination of eyes and vision without abnormal findings: Secondary | ICD-10-CM | POA: Diagnosis not present

## 2023-10-01 NOTE — Patient Instructions (Signed)
 Health Maintenance for Postmenopausal Women Menopause is a normal process in which your ability to get pregnant comes to an end. This process happens slowly over many months or years, usually between the ages of 24 and 62. Menopause is complete when you have missed your menstrual period for 12 months. It is important to talk with your health care provider about some of the most common conditions that affect women after menopause (postmenopausal women). These include heart disease, cancer, and bone loss (osteoporosis). Adopting a healthy lifestyle and getting preventive care can help to promote your health and wellness. The actions you take can also lower your chances of developing some of these common conditions. What are the signs and symptoms of menopause? During menopause, you may have the following symptoms: Hot flashes. These can be moderate or severe. Night sweats. Decrease in sex drive. Mood swings. Headaches. Tiredness (fatigue). Irritability. Memory problems. Problems falling asleep or staying asleep. Talk with your health care provider about treatment options for your symptoms. Do I need hormone replacement therapy? Hormone replacement therapy is effective in treating symptoms that are caused by menopause, such as hot flashes and night sweats. Hormone replacement carries certain risks, especially as you become older. If you are thinking about using estrogen or estrogen with progestin, discuss the benefits and risks with your health care provider. How can I reduce my risk for heart disease and stroke? The risk of heart disease, heart attack, and stroke increases as you age. One of the causes may be a change in the body's hormones during menopause. This can affect how your body uses dietary fats, triglycerides, and cholesterol. Heart attack and stroke are medical emergencies. There are many things that you can do to help prevent heart disease and stroke. Watch your blood pressure High  blood pressure causes heart disease and increases the risk of stroke. This is more likely to develop in people who have high blood pressure readings or are overweight. Have your blood pressure checked: Every 3-5 years if you are 50-75 years of age. Every year if you are 77 years old or older. Eat a healthy diet  Eat a diet that includes plenty of vegetables, fruits, low-fat dairy products, and lean protein. Do not eat a lot of foods that are high in solid fats, added sugars, or sodium. Get regular exercise Get regular exercise. This is one of the most important things you can do for your health. Most adults should: Try to exercise for at least 150 minutes each week. The exercise should increase your heart rate and make you sweat (moderate-intensity exercise). Try to do strengthening exercises at least twice each week. Do these in addition to the moderate-intensity exercise. Spend less time sitting. Even light physical activity can be beneficial. Other tips Work with your health care provider to achieve or maintain a healthy weight. Do not use any products that contain nicotine or tobacco. These products include cigarettes, chewing tobacco, and vaping devices, such as e-cigarettes. If you need help quitting, ask your health care provider. Know your numbers. Ask your health care provider to check your cholesterol and your blood sugar (glucose). Continue to have your blood tested as directed by your health care provider. Do I need screening for cancer? Depending on your health history and family history, you may need to have cancer screenings at different stages of your life. This may include screening for: Breast cancer. Cervical cancer. Lung cancer. Colorectal cancer. What is my risk for osteoporosis? After menopause, you may be  at increased risk for osteoporosis. Osteoporosis is a condition in which bone destruction happens more quickly than new bone creation. To help prevent osteoporosis or  the bone fractures that can happen because of osteoporosis, you may take the following actions: If you are 61-3 years old, get at least 1,000 mg of calcium and at least 600 international units (IU) of vitamin D per day. If you are older than age 61 but younger than age 75, get at least 1,200 mg of calcium and at least 600 international units (IU) of vitamin D per day. If you are older than age 62, get at least 1,200 mg of calcium and at least 800 international units (IU) of vitamin D per day. Smoking and drinking excessive alcohol increase the risk of osteoporosis. Eat foods that are rich in calcium and vitamin D, and do weight-bearing exercises several times each week as directed by your health care provider. How does menopause affect my mental health? Depression may occur at any age, but it is more common as you become older. Common symptoms of depression include: Feeling depressed. Changes in sleep patterns. Changes in appetite or eating patterns. Feeling an overall lack of motivation or enjoyment of activities that you previously enjoyed. Frequent crying spells. Talk with your health care provider if you think that you are experiencing any of these symptoms. General instructions See your health care provider for regular wellness exams and vaccines. This may include: Scheduling regular health, dental, and eye exams. Getting and maintaining your vaccines. These include: Influenza vaccine. Get this vaccine each year before the flu season begins. Pneumonia vaccine. Shingles vaccine. Tetanus, diphtheria, and pertussis (Tdap) booster vaccine. Your health care provider may also recommend other immunizations. Tell your health care provider if you have ever been abused or do not feel safe at home. Summary Menopause is a normal process in which your ability to get pregnant comes to an end. This condition causes hot flashes, night sweats, decreased interest in sex, mood swings, headaches, or lack  of sleep. Treatment for this condition may include hormone replacement therapy. Take actions to keep yourself healthy, including exercising regularly, eating a healthy diet, watching your weight, and checking your blood pressure and blood sugar levels. Get screened for cancer and depression. Make sure that you are up to date with all your vaccines. This information is not intended to replace advice given to you by your health care provider. Make sure you discuss any questions you have with your health care provider. Document Revised: 10/08/2020 Document Reviewed: 10/08/2020 Elsevier Patient Education  2024 [employer].

## 2023-10-01 NOTE — Progress Notes (Signed)
 Subjective:    Patient ID: Anita Mathews, female    DOB: 09/12/1950, 73 y.o.   MRN: [id number]  HPI  Patient presents to clinic today for annual exam.  Flu: 03/2023 Tetanus: 04/2023 COVID: Pfizer x 4 Pneumovax: 01/2019 Prevnar 13: 04/2023 Shingrix: 11/2020 11/2022 Pap smear: 07/2020 Mammogram: 09/2023 Bone density: 04/2020 Colon screening: 04/2019 Vision screening: annually Dentist: biannually  Diet: She does eat some meat. She consumes fruits and veggies. She tries to avoid fried foods. She drinks mostly water, coffee. Exercise: Yardwork   Review of Systems     Past Medical History:  Diagnosis Date   Arthritis    Glaucoma     Current Outpatient Medications  Medication Sig Dispense Refill   acetaminophen (TYLENOL) 500 MG tablet Take by mouth.     alendronate  (FOSAMAX ) 70 MG tablet Take 1 tablet (70 mg total) by mouth every 7 (seven) days. Take with a full glass of water on an empty stomach. 12 tablet 0   amLODipine  (NORVASC ) 2.5 MG tablet Take 1 tablet (2.5 mg total) by mouth daily. 90 tablet 0   Biotin 1 MG CAPS Take 1 capsule by mouth daily.     cetirizine (ZYRTEC) 10 MG chewable tablet Chew 10 mg by mouth daily.     Cholecalciferol (VITAMIN D-1000 MAX ST) 25 MCG (1000 UT) tablet Take by mouth.     cyanocobalamin 100 MCG tablet      dorzolamide-timolol (COSOPT) 22.3-6.8 MG/ML ophthalmic solution Apply to eye.     hydrocortisone  2.5 % cream Apply topically 2 (two) times daily. 30 g 1   ibuprofen (ADVIL) 200 MG tablet Take by mouth. (Patient not taking: Reported on 09/24/2023)     ketoconazole  (NIZORAL ) 2 % cream Apply 1 Application topically daily. 30 g 0   OPTIVE 0.5-0.9 % ophthalmic solution SMARTSIG:1 Drop(s) In Eye(s) PRN     predniSONE  (DELTASONE ) 10 MG tablet Take 6 tabs on day 1, 5 tabs on day 2, 4 tabs on day 3, 3 tabs on day 4, 2 tabs on day 5, 1 tab on day 6 (Patient not taking: Reported on 09/24/2023) 21 tablet 0   rosuvastatin  (CRESTOR ) 5 MG tablet Take 1  tablet (5 mg total) by mouth daily. TAKE 1 TABLET(5 MG) BY MOUTH DAILY 90 tablet 1   Secukinumab (COSENTYX) 150 MG/ML SOSY Inject 150 mg into the skin every 30 (thirty) days.     triamcinolone  cream (KENALOG ) 0.1 % APPLY TOPICALLY TO THE AFFECTED AREA TWICE DAILY FOR 1 TO 2 WEEKS AS NEEDED FOR RASH 30 g 0   No current facility-administered medications for this visit.    No Known Allergies  Family History  Problem Relation Age of Onset   Breast cancer Mother 16   Breast cancer Paternal Grandmother     Social History   Socioeconomic History   Marital status: Divorced    Spouse name: Not on file   Number of children: Not on file   Years of education: Not on file   Highest education level: Doctorate  Occupational History   Not on file  Tobacco Use   Smoking status: Never   Smokeless tobacco: Never  Vaping Use   Vaping status: Never Used  Substance and Sexual Activity   Alcohol use: Yes    Comment: occasional   Drug use: Never   Sexual activity: Not on file  Other Topics Concern   Not on file  Social History Narrative   Not on file   Social Drivers  of Health   Financial Resource Strain: Low Risk  (06/22/2023)   Overall Financial Resource Strain (CARDIA)    Difficulty of Paying Living Expenses: Not very hard  Food Insecurity: No Food Insecurity (06/22/2023)   Hunger Vital Sign    Worried About Running Out of Food in the Last Year: Never true    Ran Out of Food in the Last Year: Never true  Transportation Needs: No Transportation Needs (06/22/2023)   PRAPARE - Administrator, Civil Service (Medical): No    Lack of Transportation (Non-Medical): No  Physical Activity: Sufficiently Active (06/22/2023)   Exercise Vital Sign    Days of Exercise per Week: 3 days    Minutes of Exercise per Session: 60 min  Recent Concern: Physical Activity - Insufficiently Active (04/01/2023)   Exercise Vital Sign    Days of Exercise per Week: 2 days    Minutes of Exercise per  Session: 20 min  Stress: No Stress Concern Present (06/22/2023)   [employer] of Occupational Health - Occupational Stress Questionnaire    Feeling of Stress : Only a little  Social Connections: Moderately Isolated (06/22/2023)   Social Connection and Isolation Panel [NHANES]    Frequency of Communication with Friends and Family: Three times a week    Frequency of Social Gatherings with Friends and Family: Three times a week    Attends Religious Services: Never    Active Member of Clubs or Organizations: Yes    Attends Banker Meetings: Never    Marital Status: Divorced  Catering manager Violence: Not At Risk (04/17/2023)   Humiliation, Afraid, Rape, and Kick questionnaire    Fear of Current or Ex-Partner: No    Emotionally Abused: No    Physically Abused: No    Sexually Abused: No     Constitutional: Denies fever, malaise, fatigue, headache or abrupt weight changes.  HEENT: Pt reports post nasal drip. Denies eye pain, eye redness, ear pain, ringing in the ears, wax buildup, runny nose, nasal congestion, bloody nose, or sore throat. Respiratory: Denies difficulty breathing, shortness of breath, cough or sputum production.   Cardiovascular: Denies chest pain, chest tightness, palpitations or swelling in the hands or feet.  Gastrointestinal: Denies abdominal pain, bloating, constipation, diarrhea or blood in the stool.  GU: Denies urgency, frequency, pain with urination, burning sensation, blood in urine, odor or discharge. Musculoskeletal: Patient reports joint pain.  Denies decrease in range of motion, difficulty with gait, muscle pain or joint swelling.  Skin: Denies redness, rashes, lesions or ulcercations.  Neurological: Denies dizziness, difficulty with memory, difficulty with speech or problems with balance and coordination.  Psych: Denies anxiety, depression, SI/HI.  No other specific complaints in a complete review of systems (except as listed in HPI  above).  Objective:   Physical Exam BP 118/60 (BP Location: Right Arm, Patient Position: Sitting, Cuff Size: Normal)   Ht 5\' 4"  (1.626 m)   Wt 161 lb (73 kg)   BMI 27.64 kg/m    Wt Readings from Last 3 Encounters:  09/24/23 164 lb 3.2 oz (74.5 kg)  08/20/23 162 lb 12.8 oz (73.8 kg)  06/22/23 163 lb 12.8 oz (74.3 kg)    General: Appears her stated age, overweight, in NAD. Skin: Warm, dry and intact.  HEENT: Head: normal shape and size; Eyes: sclera white, no icterus, conjunctiva pink, PERRLA and EOMs intact;  Neck:  Neck supple, trachea midline. No masses, lumps or thyromegaly present.  Cardiovascular: Normal rate and rhythm. S1,S2  noted.  No murmur, rubs or gallops noted. No JVD or BLE edema. No carotid bruits noted. Pulmonary/Chest: Normal effort and positive vesicular breath sounds. No respiratory distress. No wheezes, rales or ronchi noted.  Abdomen: Normal bowel sounds.  Musculoskeletal: Strength 5/5 BUE/BLE. No difficulty with gait.  Neurological: Alert and oriented. Cranial nerves II-XII grossly intact. Coordination normal.  Psychiatric: Mood and affect normal. Behavior is normal. Judgment and thought content normal.    BMET    Component Value Date/Time   NA 139 03/03/2023 0953   K 4.8 03/03/2023 0953   CL 104 03/03/2023 0953   CO2 28 03/03/2023 0953   GLUCOSE 67 03/03/2023 0953   BUN 10 03/03/2023 0953   CREATININE 0.91 03/03/2023 0953   CALCIUM  9.9 03/03/2023 0953   GFRNONAA >60 06/06/2021 2244   GFRAA >60 01/24/2018 1056    Lipid Panel     Component Value Date/Time   CHOL 157 04/02/2023 0918   TRIG 115 04/02/2023 0918   HDL 79 04/02/2023 0918   CHOLHDL 2.0 04/02/2023 0918   LDLCALC 58 04/02/2023 0918    CBC    Component Value Date/Time   WBC 4.7 04/02/2023 0918   RBC 4.11 04/02/2023 0918   HGB 12.6 04/02/2023 0918   HCT 38.2 04/02/2023 0918   PLT 278 04/02/2023 0918   MCV 92.9 04/02/2023 0918   MCH 30.7 04/02/2023 0918   MCHC 33.0 04/02/2023  0918   RDW 12.6 04/02/2023 0918   LYMPHSABS 2.2 01/24/2018 1056   MONOABS 0.9 01/24/2018 1056   EOSABS 0.1 01/24/2018 1056   BASOSABS 0.1 01/24/2018 1056    Hgb A1C Lab Results  Component Value Date   HGBA1C 5.5 04/02/2023           Assessment & Plan:   Preventative Health Maintenance:  Encouraged her to get a flu shot in the fall Tetanus UTD Encouraged her to get her COVID booster Pneumovax and Prevnar UTD Shingrix UTD She no longer wants to screen for cervical cancer Mammogram UTD Bone density ordered-she will call to schedule Colon screening UTD Encouraged her to consume a balanced diet and exercise regimen Will check CBC, c-Met, lipid and A1c today  RTC in 6 months, follow-up chronic conditions Helayne Lo, NP

## 2023-10-01 NOTE — Assessment & Plan Note (Signed)
 Encouraged diet and exercise for weight loss ?

## 2023-10-02 ENCOUNTER — Encounter: Payer: Self-pay | Admitting: Internal Medicine

## 2023-10-02 LAB — LIPID PANEL
Cholesterol: 154 mg/dL (ref <200)
HDL: 82 mg/dL (ref >=50)
LDL Cholesterol (Calc): 54 mg/dL
Non-HDL Cholesterol (Calc): 72 mg/dL (ref <130)
Total CHOL/HDL Ratio: 1.9 (calc) (ref <5.0)
Triglycerides: 93 mg/dL (ref <150)

## 2023-10-02 LAB — CBC
HCT: 38.2 % (ref 35.0–45.0)
Hemoglobin: 12.3 g/dL (ref 11.7–15.5)
MCH: 29.9 pg (ref 27.0–33.0)
MCHC: 32.2 g/dL (ref 32.0–36.0)
MCV: 92.7 fL (ref 80.0–100.0)
MPV: 10.2 fL (ref 7.5–12.5)
Platelets: 274 10*3/uL (ref 140–400)
RBC: 4.12 10*6/uL (ref 3.80–5.10)
RDW: 12.8 % (ref 11.0–15.0)
WBC: 4.1 10*3/uL (ref 3.8–10.8)

## 2023-10-02 LAB — COMPREHENSIVE METABOLIC PANEL WITH GFR
AG Ratio: 2 (calc) (ref 1.0–2.5)
ALT: 16 U/L (ref 6–29)
AST: 21 U/L (ref 10–35)
Albumin: 4.6 g/dL (ref 3.6–5.1)
Alkaline phosphatase (APISO): 84 U/L (ref 37–153)
BUN: 14 mg/dL (ref 7–25)
CO2: 28 mmol/L (ref 20–32)
Calcium: 9.9 mg/dL (ref 8.6–10.4)
Chloride: 104 mmol/L (ref 98–110)
Creat: 0.91 mg/dL (ref 0.60–1.00)
Globulin: 2.3 g/dL (ref 1.9–3.7)
Glucose, Bld: 91 mg/dL (ref 65–139)
Potassium: 4.6 mmol/L (ref 3.5–5.3)
Sodium: 139 mmol/L (ref 135–146)
Total Bilirubin: 0.6 mg/dL (ref 0.2–1.2)
Total Protein: 6.9 g/dL (ref 6.1–8.1)
eGFR: 67 mL/min/{1.73_m2} (ref >=60)

## 2023-10-02 LAB — HEMOGLOBIN A1C
Hgb A1c MFr Bld: 5.5 % (ref <5.7)
Mean Plasma Glucose: 111 mg/dL
eAG (mmol/L): 6.2 mmol/L

## 2023-10-05 ENCOUNTER — Other Ambulatory Visit: Payer: Self-pay | Admitting: Internal Medicine

## 2023-10-06 NOTE — Telephone Encounter (Signed)
 Requested medications are due for refill today.  yes  Requested medications are on the active medications list.  yes  Last refill. 07/13/2023 #12 0 rf  Future visit scheduled.   yes  Notes to clinic.  Missing/ expired labs.    Requested Prescriptions  Pending Prescriptions Disp Refills   alendronate  (FOSAMAX ) 70 MG tablet [Pharmacy Med Name: ALENDRONATE  SODIUM 70 MG TAB] 12 tablet 0    Sig: TAKE 1 TABLET (70 MG TOTAL) BY MOUTH EVERY 7 DAYS WITH FULL GLASS WATER ON EMPTY STOMACH     Endocrinology:  Bisphosphonates Failed - 10/06/2023  3:17 PM      Failed - Vitamin D in normal range and within 360 days    No results found for: "[id number]", "AO1308MV7", "[id number]", "25OHVITD3", "25OHVITD2", "25OHVITD1", "VD25OH"       Failed - Mg Level in normal range and within 360 days    Magnesium  Date Value Ref Range Status  05/30/2021 2.2 1.5 - 2.5 mg/dL Final         Failed - Phosphate in normal range and within 360 days    No results found for: "PHOS"       Failed - Bone Mineral Density or Dexa Scan completed in the last 2 years      Passed - Ca in normal range and within 360 days    Calcium   Date Value Ref Range Status  10/01/2023 9.9 8.6 - 10.4 mg/dL Final         Passed - Cr in normal range and within 360 days    Creat  Date Value Ref Range Status  10/01/2023 0.91 0.60 - 1.00 mg/dL Final         Passed - eGFR is 30 or above and within 360 days    GFR calc Af Amer  Date Value Ref Range Status  01/24/2018 >60 >60 mL/min Final    Comment:    (NOTE) The eGFR has been calculated using the CKD EPI equation. This calculation has not been validated in all clinical situations. eGFR's persistently <60 mL/min signify possible Chronic Kidney Disease.    GFR, Estimated  Date Value Ref Range Status  06/06/2021 >60 >60 mL/min Final    Comment:    (NOTE) Calculated using the CKD-EPI Creatinine Equation (2021)    eGFR  Date Value Ref Range Status  10/01/2023 67 > OR = 60  mL/min/1.76m2 Final         Passed - Valid encounter within last 12 months    Recent Outpatient Visits           5 days ago Encounter for general adult medical examination with abnormal findings   [facility] [place], Rankin Buzzard, NP

## 2023-10-20 ENCOUNTER — Encounter (INDEPENDENT_AMBULATORY_CARE_PROVIDER_SITE_OTHER): Payer: Self-pay

## 2023-10-28 DIAGNOSIS — Z961 Presence of intraocular lens: Secondary | ICD-10-CM | POA: Diagnosis not present

## 2023-10-28 DIAGNOSIS — Z8669 Personal history of other diseases of the nervous system and sense organs: Secondary | ICD-10-CM | POA: Diagnosis not present

## 2023-10-28 DIAGNOSIS — H401122 Primary open-angle glaucoma, left eye, moderate stage: Secondary | ICD-10-CM | POA: Diagnosis not present

## 2023-10-28 DIAGNOSIS — H04129 Dry eye syndrome of unspecified lacrimal gland: Secondary | ICD-10-CM | POA: Diagnosis not present

## 2023-11-04 ENCOUNTER — Other Ambulatory Visit: Payer: Self-pay | Admitting: Internal Medicine

## 2023-11-05 NOTE — Telephone Encounter (Signed)
 Requested Prescriptions  Pending Prescriptions Disp Refills   amLODipine  (NORVASC ) 2.5 MG tablet [Pharmacy Med Name: AMLODIPINE  BESYLATE 2.5 MG TAB] 90 tablet 0    Sig: TAKE 1 TABLET BY MOUTH EVERY DAY     Cardiovascular: Calcium  Channel Blockers 2 Passed - 11/05/2023  4:59 PM      Passed - Last BP in normal range    BP Readings from Last 1 Encounters:  10/01/23 118/60         Passed - Last Heart Rate in normal range    Pulse Readings from Last 1 Encounters:  09/24/23 (!) 46         Passed - Valid encounter within last 6 months    Recent Outpatient Visits           1 month ago Encounter for general adult medical examination with abnormal findings   [facility], Rankin Buzzard, [place]

## 2023-11-10 ENCOUNTER — Ambulatory Visit (INDEPENDENT_AMBULATORY_CARE_PROVIDER_SITE_OTHER): Admitting: Internal Medicine

## 2023-11-10 ENCOUNTER — Encounter: Payer: Self-pay | Admitting: Internal Medicine

## 2023-11-10 VITALS — BP 128/62 | HR 58 | Ht 64.0 in | Wt 161.6 lb

## 2023-11-10 DIAGNOSIS — G8929 Other chronic pain: Secondary | ICD-10-CM

## 2023-11-10 DIAGNOSIS — R5383 Other fatigue: Secondary | ICD-10-CM

## 2023-11-10 DIAGNOSIS — E559 Vitamin D deficiency, unspecified: Secondary | ICD-10-CM

## 2023-11-10 DIAGNOSIS — R42 Dizziness and giddiness: Secondary | ICD-10-CM | POA: Diagnosis not present

## 2023-11-10 DIAGNOSIS — K0889 Other specified disorders of teeth and supporting structures: Secondary | ICD-10-CM | POA: Diagnosis not present

## 2023-11-10 DIAGNOSIS — E538 Deficiency of other specified B group vitamins: Secondary | ICD-10-CM | POA: Diagnosis not present

## 2023-11-10 DIAGNOSIS — R4189 Other symptoms and signs involving cognitive functions and awareness: Secondary | ICD-10-CM | POA: Diagnosis not present

## 2023-11-10 DIAGNOSIS — K219 Gastro-esophageal reflux disease without esophagitis: Secondary | ICD-10-CM | POA: Diagnosis not present

## 2023-11-10 DIAGNOSIS — M25512 Pain in left shoulder: Secondary | ICD-10-CM

## 2023-11-10 DIAGNOSIS — R079 Chest pain, unspecified: Secondary | ICD-10-CM | POA: Diagnosis not present

## 2023-11-10 DIAGNOSIS — G4709 Other insomnia: Secondary | ICD-10-CM | POA: Diagnosis not present

## 2023-11-10 DIAGNOSIS — R0602 Shortness of breath: Secondary | ICD-10-CM | POA: Diagnosis not present

## 2023-11-10 NOTE — Progress Notes (Signed)
 Subjective:    Patient ID: Anita Mathews, female    DOB: [birth year], 73 y.o.   MRN: [id number]  HPI  Discussed the use of AI scribe software for clinical note transcription with the patient, who gave verbal consent to proceed.  Anita Mathews is a 73 year old female who presents with intermittent fatigue, sleep disturbances, and chest discomfort.  She has been experiencing intermittent fatigue and sleep disturbances over the past couple of weeks. She wakes up in the middle of the night to urinate and then struggles to return to sleep for one to two hours. Recently, she has been getting only four to five hours of sleep per night, leading to feelings of fatigue, 'fuzziness,' and a sensation similar to a 'miniature hangover.'  She denies dizziness but is reluctant to move quickly. She describes a sensation of 'fuzziness' in her head, which she struggles to define further. No changes in vision or increased tinnitus, which she has experienced previously.  She reports nasal congestion and postnasal drip but denies ear pain or sore throat. She experienced a throbbing pain in a molar on the top back of her jaw after leaving work yesterday, although she had dental work on the opposite side last week. The pain has since resolved.  She notes shortness of breath with physical activity but denies a persistent cough. She experienced chest discomfort yesterday, described as a new sensation, which resolved after sitting down. She measured her blood pressure and heart rate at that time and found them to be normal.  She has not had any syncopal episodes.  No nausea, vomiting, diarrhea, constipation, or urinary symptoms. She occasionally experiences indigestion, particularly after eating certain foods, and finds relief with tums. Her bowel movements are regular.  She mentions increased stress due to helping her daughter and son-in-law with their move and caring for their children, which she feels may have  contributed to her symptoms. She maintains a regular diet and hydration routine.       Review of Systems     Past Medical History:  Diagnosis Date   Arthritis    Glaucoma     Current Outpatient Medications  Medication Sig Dispense Refill   acetaminophen (TYLENOL) 500 MG tablet Take by mouth.     alendronate  (FOSAMAX ) 70 MG tablet TAKE 1 TABLET (70 MG TOTAL) BY MOUTH EVERY 7 DAYS WITH FULL GLASS WATER ON EMPTY STOMACH 12 tablet 0   amLODipine  (NORVASC ) 2.5 MG tablet Take 1 tablet (2.5 mg total) by mouth daily. 90 tablet 0   Biotin 1 MG CAPS Take 1 capsule by mouth daily.     cetirizine (ZYRTEC) 10 MG chewable tablet Chew 10 mg by mouth daily.     Cholecalciferol (VITAMIN D-1000 MAX ST) 25 MCG (1000 UT) tablet Take by mouth.     cyanocobalamin 100 MCG tablet      dorzolamide-timolol (COSOPT) 22.3-6.8 MG/ML ophthalmic solution Apply to eye.     hydrocortisone  2.5 % cream Apply topically 2 (two) times daily. 30 g 1   ketoconazole  (NIZORAL ) 2 % cream Apply 1 Application topically daily. 30 g 0   OPTIVE 0.5-0.9 % ophthalmic solution SMARTSIG:1 Drop(s) In Eye(s) PRN     rosuvastatin  (CRESTOR ) 5 MG tablet Take 1 tablet (5 mg total) by mouth daily. TAKE 1 TABLET(5 MG) BY MOUTH DAILY 90 tablet 1   Secukinumab (COSENTYX) 150 MG/ML SOSY Inject 150 mg into the skin every 30 (thirty) days.     triamcinolone  cream (KENALOG ) 0.1 %  APPLY TOPICALLY TO THE AFFECTED AREA TWICE DAILY FOR 1 TO 2 WEEKS AS NEEDED FOR RASH 30 g 0   No current facility-administered medications for this visit.    No Known Allergies  Family History  Problem Relation Age of Onset   Breast cancer Mother 49   Breast cancer Paternal Grandmother     Social History   Socioeconomic History   Marital status: Divorced    Spouse name: Not on file   Number of children: Not on file   Years of education: Not on file   Highest education level: Doctorate  Occupational History   Not on file  Tobacco Use   Smoking status:  Never   Smokeless tobacco: Never  Vaping Use   Vaping status: Never Used  Substance and Sexual Activity   Alcohol use: Yes    Comment: occasional   Drug use: Never   Sexual activity: Not on file  Other Topics Concern   Not on file  Social History Narrative   Not on file   Social Drivers of Health   Financial Resource Strain: Low Risk  (06/22/2023)   Overall Financial Resource Strain (CARDIA)    Difficulty of Paying Living Expenses: Not very hard  Food Insecurity: No Food Insecurity (06/22/2023)   Hunger Vital Sign    Worried About Running Out of Food in the Last Year: Never true    Ran Out of Food in the Last Year: Never true  Transportation Needs: No Transportation Needs (06/22/2023)   PRAPARE - Administrator, Civil Service (Medical): No    Lack of Transportation (Non-Medical): No  Physical Activity: Sufficiently Active (06/22/2023)   Exercise Vital Sign    Days of Exercise per Week: 3 days    Minutes of Exercise per Session: 60 min  Recent Concern: Physical Activity - Insufficiently Active (04/01/2023)   Exercise Vital Sign    Days of Exercise per Week: 2 days    Minutes of Exercise per Session: 20 min  Stress: No Stress Concern Present (06/22/2023)   [employer] of Occupational Health - Occupational Stress Questionnaire    Feeling of Stress : Only a little  Social Connections: Moderately Isolated (06/22/2023)   Social Connection and Isolation Panel [NHANES]    Frequency of Communication with Friends and Family: Three times a week    Frequency of Social Gatherings with Friends and Family: Three times a week    Attends Religious Services: Never    Active Member of Clubs or Organizations: Yes    Attends Banker Meetings: Never    Marital Status: Divorced  Catering manager Violence: Not At Risk (04/17/2023)   Humiliation, Afraid, Rape, and Kick questionnaire    Fear of Current or Ex-Partner: No    Emotionally Abused: No    Physically Abused:  No    Sexually Abused: No     Constitutional: Patient reports fatigue.  Denies fever, malaise, headache or abrupt weight changes.  HEENT: Patient reports dental pain, ringing in the ears, runny nose, nasal congestion.  Denies eye pain, eye redness, ear pain, wax buildup, bloody nose, or sore throat. Respiratory: Pt reports intermittent shortness of breath. Denies difficulty breathing, cough or sputum production.   Cardiovascular: Pt reports chest pain. Denies chest tightness, palpitations or swelling in the hands or feet.  Gastrointestinal: Patient reports abdominal discomfort, reflux.  Denies bloating, constipation, diarrhea or blood in the stool.  GU: Denies urgency, frequency, pain with urination, burning sensation, blood in urine, odor or  discharge. Musculoskeletal: Patient reports left shoulder pain and decreased range of motion.  Denies difficulty with gait, muscle pain or joint swelling.  Skin: Denies redness, rashes, lesions or ulcercations.  Neurological: Patient reports insomnia, brain fog and lightheadedness.  Denies dizziness, difficulty with memory, difficulty with speech or problems with balance and coordination.    No other specific complaints in a complete review of systems (except as listed in HPI above).  Objective:   Physical Exam BP 128/62 (BP Location: Right Arm, Patient Position: Sitting, Cuff Size: Normal)   Pulse (!) 58   Ht 5\' 4"  (1.626 m)   Wt 161 lb 9.6 oz (73.3 kg)   SpO2 99%   BMI 27.74 kg/m     Wt Readings from Last 3 Encounters:  10/01/23 161 lb (73 kg)  09/24/23 164 lb 3.2 oz (74.5 kg)  08/20/23 162 lb 12.8 oz (73.8 kg)    General: Appears her stated age, overweight, in NAD. Skin: Warm, dry and intact.  No rashes or bruising noted. HEENT: Head: normal shape and size; Eyes: sclera white, no icterus, conjunctiva pink, PERRLA and EOMs intact; Throat: Right upper back molar with filling intact, no redness or swelling at the gumline. Neck:  Neck  supple, trachea midline. No masses, lumps or thyromegaly present.  Cardiovascular: Bradycardic with normal rhythm. S1,S2 noted.  No murmur, rubs or gallops noted. No JVD or BLE edema. No carotid bruits noted. Pulmonary/Chest: Normal effort and positive vesicular breath sounds. No respiratory distress. No wheezes, rales or ronchi noted.  Abdomen: Soft and nontender.  Normal bowel sounds.  Musculoskeletal: Strength 5/5 BUE/BLE. No difficulty with gait.  Neurological: Alert and oriented. Cranial nerves II-XII grossly intact. Coordination normal.  Psychiatric: Mood and affect normal. Behavior is normal. Judgment and thought content normal.    BMET    Component Value Date/Time   NA 139 10/01/2023 1017   K 4.6 10/01/2023 1017   CL 104 10/01/2023 1017   CO2 28 10/01/2023 1017   GLUCOSE 91 10/01/2023 1017   BUN 14 10/01/2023 1017   CREATININE 0.91 10/01/2023 1017   CALCIUM  9.9 10/01/2023 1017   GFRNONAA >60 06/06/2021 2244   GFRAA >60 01/24/2018 1056    Lipid Panel     Component Value Date/Time   CHOL 154 10/01/2023 1017   TRIG 93 10/01/2023 1017   HDL 82 10/01/2023 1017   CHOLHDL 1.9 10/01/2023 1017   LDLCALC 54 10/01/2023 1017    CBC    Component Value Date/Time   WBC 4.1 10/01/2023 1017   RBC 4.12 10/01/2023 1017   HGB 12.3 10/01/2023 1017   HCT 38.2 10/01/2023 1017   PLT 274 10/01/2023 1017   MCV 92.7 10/01/2023 1017   MCH 29.9 10/01/2023 1017   MCHC 32.2 10/01/2023 1017   RDW 12.8 10/01/2023 1017   LYMPHSABS 2.2 01/24/2018 1056   MONOABS 0.9 01/24/2018 1056   EOSABS 0.1 01/24/2018 1056   BASOSABS 0.1 01/24/2018 1056    Hgb A1C Lab Results  Component Value Date   HGBA1C 5.5 10/01/2023           Assessment & Plan:   Assessment and Plan    Intermittent chest pain, shortness of breath, lightheadedness, brain fog: Intermittent chest pain resolved with rest. Differential includes cardiac etiology, stress, or atypical heartburn. Concern for cardiac issues  given age and sex. - Order EKG to rule out cardiac issues. -Indication for ECG: Chest pain -Interpretation of ECG: Sinus bradycardia, nonspecific anterolateral T wave changes -Comparison of ECG:  07/2021, unchanged - Order troponin I level to assess for myocardial infarction. - Perform basic labs including CBC, liver and kidney function tests, thyroid  function, vitamin D, and B12 levels.  Left shoulder pain post-replacement Intermittent pain post-shoulder replacement with limited range of motion. Concern for degeneration of replacement components. - Advise follow-up with orthopedist next week for further evaluation.  Insomnia Difficulty sleeping with frequent awakenings. Possible stress-related factors. - Evaluate thyroid  function as part of the lab workup.  Tooth pain Intermittent throbbing pain in molar region, resolved spontaneously. No visible infection or swelling. - Monitor for recurrence and consider dental follow-up if symptoms persist.  Stress management Discussed stress management and its impact on physical health. - Encourage stress management techniques.     RTC in 5 months, follow-up chronic conditions Helayne Lo, NP

## 2023-11-10 NOTE — Patient Instructions (Signed)
 Dizziness Dizziness is a common problem. It makes you feel unsteady or light-headed. You may feel like you're about to faint. Dizziness can lead to getting hurt if you stumble or fall. It's more common to feel dizzy if you're an older adult. Many things can cause you to feel dizzy. These include: Medicines. Dehydration. This is when there's not enough water in your body. Illness. Follow these instructions at home: Eating and drinking  Drink enough fluid to keep your pee (urine) pale yellow. This helps keep you from getting dehydrated. Try to drink more clear fluids, such as water. Do not drink alcohol. Try to limit how much caffeine you take in. Try to limit how much salt, also called sodium, you take in. Activity Try not to make quick movements. Stand up slowly from sitting in a chair. Steady yourself until you feel okay. In the morning, first sit up on the side of the bed. When you feel okay, hold onto something and slowly stand up. Do this until you know that your balance is okay. If you need to stand in one place for a long time, move your legs often. Tighten and relax the muscles in your legs while you're standing. Do not drive or use machines if you feel dizzy. Avoid bending down if you feel dizzy. Place items in your home so you can reach them without leaning over. Lifestyle Do not smoke, vape, or use products with nicotine or tobacco in them. If you need help quitting, talk with your health care provider. Try to lower your stress level. You can do this by using methods like yoga or meditation. Talk with your provider if you need help. General instructions Watch your dizziness for any changes. Take your medicines only as told by your provider. Talk with your provider if you think you're dizzy because of a medicine you're taking. Tell a friend or a family member that you're feeling dizzy. If they spot any changes in your behavior, have them call your provider. Contact a health care  provider if: Your dizziness doesn't go away, or you have new symptoms. Your dizziness gets worse. You feel like you may vomit. You have trouble hearing. You have a fever. You have neck pain or a stiff neck. You fall or get hurt. Get help right away if: You vomit each time you eat or drink. You have watery poop and can't eat or drink. You have trouble talking, walking, swallowing, or using your arms, hands, or legs. You feel very weak. You're bleeding. You're not thinking clearly, or you have trouble forming sentences. A friend or family member may spot this. Your vision changes, or you get a very bad headache. These symptoms may be an emergency. Call 911 right away. Do not wait to see if the symptoms will go away. Do not drive yourself to the hospital. This information is not intended to replace advice given to you by your health care provider. Make sure you discuss any questions you have with your health care provider. Document Revised: 02/19/2023 Document Reviewed: 07/03/2022 Elsevier Patient Education  2024 [employer].

## 2023-11-11 ENCOUNTER — Ambulatory Visit: Payer: Self-pay | Admitting: Internal Medicine

## 2023-11-11 NOTE — Addendum Note (Signed)
 Addended by: Carollynn Cirri on: 11/11/2023 09:45 AM   Modules accepted: Orders

## 2023-11-17 DIAGNOSIS — H35343 Macular cyst, hole, or pseudohole, bilateral: Secondary | ICD-10-CM | POA: Diagnosis not present

## 2023-11-17 DIAGNOSIS — H35371 Puckering of macula, right eye: Secondary | ICD-10-CM | POA: Diagnosis not present

## 2023-12-03 ENCOUNTER — Other Ambulatory Visit
Admission: RE | Admit: 2023-12-03 | Discharge: 2023-12-03 | Disposition: A | Discharge status: Home / Self Care | Payer: Self-pay | Source: Ambulatory Visit | Attending: Medical Genetics | Admitting: Medical Genetics

## 2023-12-03 DIAGNOSIS — Z006 Encounter for examination for normal comparison and control in clinical research program: Secondary | ICD-10-CM | POA: Insufficient documentation

## 2023-12-13 ENCOUNTER — Other Ambulatory Visit: Payer: Self-pay | Admitting: Internal Medicine

## 2023-12-15 LAB — GENECONNECT MOLECULAR SCREEN: Genetic Analysis Overall Interpretation: NEGATIVE

## 2023-12-15 NOTE — Telephone Encounter (Signed)
 Requested Prescriptions  Pending Prescriptions Disp Refills   amLODipine  (NORVASC ) 2.5 MG tablet [Pharmacy Med Name: AMLODIPINE  BESYLATE 2.5 MG TAB] 90 tablet 1    Sig: TAKE 1 TABLET BY MOUTH EVERY DAY     Cardiovascular: Calcium  Channel Blockers 2 Passed - 12/15/2023  2:13 PM      Passed - Last BP in normal range    BP Readings from Last 1 Encounters:  11/10/23 128/62         Passed - Last Heart Rate in normal range    Pulse Readings from Last 1 Encounters:  11/10/23 (!) 58         Passed - Valid encounter within last 6 months    Recent Outpatient Visits           1 month ago Chest pain, unspecified type   [facility] [place], Angeline ORN, NP   2 months ago Encounter for general adult medical examination with abnormal findings   [place] [facility], Angeline ORN, NP

## 2023-12-25 ENCOUNTER — Other Ambulatory Visit: Payer: Self-pay | Admitting: Family Medicine

## 2023-12-28 NOTE — Telephone Encounter (Signed)
 Requested medications are due for refill today.  yes  Requested medications are on the active medications list.  yes  Last refill. 10/06/2023 #12 0 rf  Future visit scheduled.   yes  Notes to clinic.  Missing labs.    Requested Prescriptions  Pending Prescriptions Disp Refills   alendronate  (FOSAMAX ) 70 MG tablet [Pharmacy Med Name: ALENDRONATE  SODIUM 70 MG TAB] 12 tablet 0    Sig: TAKE 1 TABLET (70 MG TOTAL) BY MOUTH EVERY 7 DAYS WITH FULL GLASS WATER ON EMPTY STOMACH     Endocrinology:  Bisphosphonates Failed - 12/28/2023  9:49 AM      Failed - Mg Level in normal range and within 360 days    Magnesium  Date Value Ref Range Status  05/30/2021 2.2 1.5 - 2.5 mg/dL Final         Failed - Phosphate in normal range and within 360 days    No results found for: PHOS       Failed - Bone Mineral Density or Dexa Scan completed in the last 2 years      Passed - Ca in normal range and within 360 days    Calcium   Date Value Ref Range Status  11/10/2023 9.6 8.6 - 10.4 mg/dL Final         Passed - Vitamin D  in normal range and within 360 days    Vit D, 25-Hydroxy  Date Value Ref Range Status  11/10/2023 55 30 - 100 ng/mL Final    Comment:    Vitamin D  Status         25-OH Vitamin D : . Deficiency:                    <20 ng/mL Insufficiency:             20 - 29 ng/mL Optimal:                 > or = 30 ng/mL . For 25-OH Vitamin D  testing on patients on  D2-supplementation and patients for whom quantitation  of D2 and D3 fractions is required, the QuestAssureD(TM) 25-OH VIT D, (D2,D3), LC/MS/MS is recommended: order  code 07111 (patients >76yrs). . See Note 1 . Note 1 . For additional information, please refer to  http://education.[facility].com/faq/FAQ199  (This link is being provided for informational/ educational purposes only.)          Passed - Cr in normal range and within 360 days    Creat  Date Value Ref Range Status  11/10/2023 0.86 0.60 - 1.00 mg/dL  Final         Passed - eGFR is 30 or above and within 360 days    GFR calc Af Amer  Date Value Ref Range Status  01/24/2018 >60 >60 mL/min Final    Comment:    (NOTE) The eGFR has been calculated using the CKD EPI equation. This calculation has not been validated in all clinical situations. eGFR's persistently <60 mL/min signify possible Chronic Kidney Disease.    GFR, Estimated  Date Value Ref Range Status  06/06/2021 >60 >60 mL/min Final    Comment:    (NOTE) Calculated using the CKD-EPI Creatinine Equation (2021)    eGFR  Date Value Ref Range Status  11/10/2023 72 > OR = 60 mL/min/1.67m2 Final         Passed - Valid encounter within last 12 months    Recent Outpatient Visits  1 month ago Chest pain, unspecified type   [facility] [place], Angeline ORN, NP   2 months ago Encounter for general adult medical examination with abnormal findings   [facility] [facility] [place], Angeline ORN, [place]

## 2024-01-26 ENCOUNTER — Other Ambulatory Visit: Payer: Self-pay | Admitting: Internal Medicine

## 2024-01-26 DIAGNOSIS — E782 Mixed hyperlipidemia: Secondary | ICD-10-CM

## 2024-01-27 NOTE — Telephone Encounter (Signed)
 Requested Prescriptions  Pending Prescriptions Disp Refills   rosuvastatin  (CRESTOR ) 5 MG tablet [Pharmacy Med Name: ROSUVASTATIN  CALCIUM  5 MG TAB] 90 tablet 0    Sig: TAKE 1 TABLET BY MOUTH DAILY     Cardiovascular:  Antilipid - Statins 2 Failed - 01/27/2024  9:33 AM      Failed - Lipid Panel in normal range within the last 12 months    Cholesterol  Date Value Ref Range Status  10/01/2023 154 <200 mg/dL Final   LDL Cholesterol (Calc)  Date Value Ref Range Status  10/01/2023 54 mg/dL (calc) Final    Comment:    Reference range: <100 . Desirable range <100 mg/dL for primary prevention;   <70 mg/dL for patients with CHD or diabetic patients  with > or = 2 CHD risk factors. SABRA LDL-C is now calculated using the Martin-Hopkins  calculation, which is a validated novel method providing  better accuracy than the Friedewald equation in the  estimation of LDL-C.  Gladis APPLETHWAITE et al. SANDREA. 7986;689(80): 2061-2068  (http://education.[facility].com/faq/FAQ164)    HDL  Date Value Ref Range Status  10/01/2023 82 > OR = 50 mg/dL Final   Triglycerides  Date Value Ref Range Status  10/01/2023 93 <150 mg/dL Final         Passed - Cr in normal range and within 360 days    Creat  Date Value Ref Range Status  11/10/2023 0.86 0.60 - 1.00 mg/dL Final         Passed - Patient is not pregnant      Passed - Valid encounter within last 12 months    Recent Outpatient Visits           2 months ago Chest pain, unspecified type   [place] [facility] [place], Angeline ORN, NP   3 months ago Encounter for general adult medical examination with abnormal findings    [facility] [place], Angeline ORN, NP

## 2024-01-29 ENCOUNTER — Encounter: Payer: Self-pay | Admitting: Internal Medicine

## 2024-01-29 ENCOUNTER — Ambulatory Visit (INDEPENDENT_AMBULATORY_CARE_PROVIDER_SITE_OTHER): Admitting: Internal Medicine

## 2024-01-29 VITALS — BP 134/70 | HR 53 | Ht 64.0 in | Wt 160.0 lb

## 2024-01-29 DIAGNOSIS — J069 Acute upper respiratory infection, unspecified: Secondary | ICD-10-CM | POA: Diagnosis not present

## 2024-01-29 MED ORDER — AMOXICILLIN 875 MG PO TABS: 875.0000 mg | ORAL_TABLET | Freq: Two times a day (BID) | ORAL | 0 refills | Status: AC

## 2024-01-29 MED ORDER — PROMETHAZINE-DM 6.25-15 MG/5ML PO SYRP: 5.0000 mL | ORAL_SOLUTION | Freq: Four times a day (QID) | ORAL | 0 refills | Status: DC | PRN

## 2024-01-29 NOTE — Progress Notes (Signed)
 Subjective:    Patient ID: Anita Mathews, female    DOB: 1950/09/10, 73 y.o.   MRN: [id number]  HPI  Pt presents to the clinic today with c/o fatigue and upper respiratory symptoms for 2 weeks. She has had a runny nose and nasal congestion. The nasal discharge is clear but she does experience some sinus pressure. She has a sore inside her nose, which she has been putting triple antibiotic ointment on. She has had a slight cough. She denies ear pain, sore throat or shortness fo breath. She denies headaches, nausea, vomiting, diarrhea, fever, chills or body aches. She has tried mucinex OTC, which help make her cough more productive. She took a home covid test, which was negative. She has not had sick contacts that she is aware of.    Review of Systems    Past Medical History:  Diagnosis Date   Arthritis    Glaucoma     Current Outpatient Medications  Medication Sig Dispense Refill   acetaminophen (TYLENOL) 500 MG tablet Take by mouth.     alendronate  (FOSAMAX ) 70 MG tablet TAKE 1 TABLET (70 MG TOTAL) BY MOUTH EVERY 7 DAYS WITH FULL GLASS WATER ON EMPTY STOMACH 12 tablet 0   amLODipine  (NORVASC ) 2.5 MG tablet TAKE 1 TABLET BY MOUTH EVERY DAY 90 tablet 1   Biotin 1 MG CAPS Take 1 capsule by mouth daily.     Cholecalciferol (VITAMIN D -1000 MAX ST) 25 MCG (1000 UT) tablet Take by mouth.     cyanocobalamin  100 MCG tablet      dorzolamide-timolol (COSOPT) 22.3-6.8 MG/ML ophthalmic solution Apply to eye.     hydrocortisone  2.5 % cream Apply topically 2 (two) times daily. 30 g 1   ketoconazole  (NIZORAL ) 2 % cream Apply 1 Application topically daily. 30 g 0   loratadine (CLARITIN) 10 MG tablet Take 10 mg by mouth daily.     OPTIVE 0.5-0.9 % ophthalmic solution SMARTSIG:1 Drop(s) In Eye(s) PRN     rosuvastatin  (CRESTOR ) 5 MG tablet TAKE 1 TABLET BY MOUTH DAILY 90 tablet 0   Secukinumab (COSENTYX) 150 MG/ML SOSY Inject 150 mg into the skin every 30 (thirty) days.     triamcinolone  cream  (KENALOG ) 0.1 % APPLY TOPICALLY TO THE AFFECTED AREA TWICE DAILY FOR 1 TO 2 WEEKS AS NEEDED FOR RASH 30 g 0   No current facility-administered medications for this visit.    No Known Allergies  Family History  Problem Relation Age of Onset   Breast cancer Mother 71   Breast cancer Paternal Grandmother     Social History   Socioeconomic History   Marital status: Divorced    Spouse name: Not on file   Number of children: Not on file   Years of education: Not on file   Highest education level: Doctorate  Occupational History   Not on file  Tobacco Use   Smoking status: Never   Smokeless tobacco: Never  Vaping Use   Vaping status: Never Used  Substance and Sexual Activity   Alcohol use: Yes    Comment: occasional   Drug use: Never   Sexual activity: Not on file  Other Topics Concern   Not on file  Social History Narrative   Not on file   Social Drivers of Health   Financial Resource Strain: Low Risk  (06/22/2023)   Overall Financial Resource Strain (CARDIA)    Difficulty of Paying Living Expenses: Not very hard  Food Insecurity: No Food Insecurity (06/22/2023)  Hunger Vital Sign    Worried About Running Out of Food in the Last Year: Never true    Ran Out of Food in the Last Year: Never true  Transportation Needs: No Transportation Needs (06/22/2023)   PRAPARE - Administrator, Civil Service (Medical): No    Lack of Transportation (Non-Medical): No  Physical Activity: Sufficiently Active (06/22/2023)   Exercise Vital Sign    Days of Exercise per Week: 3 days    Minutes of Exercise per Session: 60 min  Recent Concern: Physical Activity - Insufficiently Active (04/01/2023)   Exercise Vital Sign    Days of Exercise per Week: 2 days    Minutes of Exercise per Session: 20 min  Stress: No Stress Concern Present (06/22/2023)   [employer] of Occupational Health - Occupational Stress Questionnaire    Feeling of Stress : Only a little  Social Connections:  Moderately Isolated (06/22/2023)   Social Connection and Isolation Panel    Frequency of Communication with Friends and Family: Three times a week    Frequency of Social Gatherings with Friends and Family: Three times a week    Attends Religious Services: Never    Active Member of Clubs or Organizations: Yes    Attends Banker Meetings: Never    Marital Status: Divorced  Catering manager Violence: Not At Risk (04/17/2023)   Humiliation, Afraid, Rape, and Kick questionnaire    Fear of Current or Ex-Partner: No    Emotionally Abused: No    Physically Abused: No    Sexually Abused: No     Constitutional: Pt reports fatigue. Denies fever, malaise, headache or abrupt weight changes.  HEENT: Pt reports nasal irritation, runny nose, nasal congestion. Denies eye pain, eye redness, ear pain, ringing in the ears, wax buildup, bloody nose or sore throat. Respiratory: Pt reports cough. Denies difficulty breathing, shortness of breath or sputum production.   Cardiovascular: Denies chest pain, chest tightness, palpitations or swelling in the hands or feet.  Gastrointestinal: Denies abdominal pain, bloating, constipation, diarrhea or blood in the stool.  Skin: Denies redness, rashes, lesions or ulcercations.  Neurological: Denies dizziness, difficulty with memory, difficulty with speech or problems with balance and coordination.    No other specific complaints in a complete review of systems (except as listed in HPI above).  Objective:   Physical Exam BP 134/70 (BP Location: Right Arm, Patient Position: Sitting, Cuff Size: Normal)   Pulse (!) 53   Ht 5' 4 (1.626 m)   Wt 160 lb (72.6 kg)   SpO2 98%   BMI 27.46 kg/m     Wt Readings from Last 3 Encounters:  11/10/23 161 lb 9.6 oz (73.3 kg)  10/01/23 161 lb (73 kg)  09/24/23 164 lb 3.2 oz (74.5 kg)    General: Appears her stated age, overweight, in NAD. Skin: Warm, dry and intact.  HEENT: Head: normal shape and size; Eyes:  sclera white, no icterus, conjunctiva pink, PERRLA and EOMs intact; Nose: Ulcerated lesion noted just inside the left nare; Throat: mucosa pink and moist, + PND. Neck:  No adenopathy noted.  Cardiovascular: Bradycardic with normal rhythm. S1,S2 noted.  No murmur, rubs or gallops noted. No JVD or BLE edema. No carotid bruits noted. Pulmonary/Chest: Normal effort and positive vesicular breath sounds. No respiratory distress. No wheezes, rales or ronchi noted.  Musculoskeletal:  No difficulty with gait.  Neurological: Alert and oriented.   BMET    Component Value Date/Time   NA 140 11/10/2023  0936   K 4.6 11/10/2023 0936   CL 105 11/10/2023 0936   CO2 28 11/10/2023 0936   GLUCOSE 76 11/10/2023 0936   BUN 13 11/10/2023 0936   CREATININE 0.86 11/10/2023 0936   CALCIUM  9.6 11/10/2023 0936   GFRNONAA >60 06/06/2021 2244   GFRAA >60 01/24/2018 1056    Lipid Panel     Component Value Date/Time   CHOL 154 10/01/2023 1017   TRIG 93 10/01/2023 1017   HDL 82 10/01/2023 1017   CHOLHDL 1.9 10/01/2023 1017   LDLCALC 54 10/01/2023 1017    CBC    Component Value Date/Time   WBC 4.4 11/10/2023 0936   RBC 4.04 11/10/2023 0936   HGB 12.2 11/10/2023 0936   HCT 37.1 11/10/2023 0936   PLT 264 11/10/2023 0936   MCV 91.8 11/10/2023 0936   MCH 30.2 11/10/2023 0936   MCHC 32.9 11/10/2023 0936   RDW 12.7 11/10/2023 0936   LYMPHSABS 2.2 01/24/2018 1056   MONOABS 0.9 01/24/2018 1056   EOSABS 0.1 01/24/2018 1056   BASOSABS 0.1 01/24/2018 1056    Hgb A1C Lab Results  Component Value Date   HGBA1C 5.5 10/01/2023           Assessment & Plan:   Acute bacterial sinusitis:  Encouraged rest and fluids RX for amoxicillin  875 mg BID x 10 days RX for promethazine  DM for cough- sedation caution given Recommend flonase  BID X 3 days then daily thereafter    RTC in 3 months, follow-up chronic conditions Angeline Laura, NP

## 2024-01-31 NOTE — Patient Instructions (Signed)

## 2024-02-16 ENCOUNTER — Encounter: Payer: Self-pay | Admitting: Internal Medicine

## 2024-03-16 DIAGNOSIS — N907 Vulvar cyst: Secondary | ICD-10-CM | POA: Diagnosis not present

## 2024-03-16 DIAGNOSIS — Z1331 Encounter for screening for depression: Secondary | ICD-10-CM | POA: Diagnosis not present

## 2024-03-16 DIAGNOSIS — N3281 Overactive bladder: Secondary | ICD-10-CM | POA: Diagnosis not present

## 2024-03-16 DIAGNOSIS — Z01411 Encounter for gynecological examination (general) (routine) with abnormal findings: Secondary | ICD-10-CM | POA: Diagnosis not present

## 2024-03-25 ENCOUNTER — Other Ambulatory Visit: Payer: Self-pay | Admitting: Internal Medicine

## 2024-03-26 NOTE — Telephone Encounter (Signed)
 Requested Prescriptions  Pending Prescriptions Disp Refills   alendronate  (FOSAMAX ) 70 MG tablet [Pharmacy Med Name: ALENDRONATE  SODIUM 70 MG TAB] 12 tablet 0    Sig: TAKE 1 TABLET (70 MG TOTAL) BY MOUTH EVERY 7 DAYS WITH FULL GLASS WATER ON EMPTY STOMACH     Endocrinology:  Bisphosphonates Failed - 03/26/2024  8:48 AM      Failed - Mg Level in normal range and within 360 days    Magnesium  Date Value Ref Range Status  05/30/2021 2.2 1.5 - 2.5 mg/dL Final         Failed - Phosphate in normal range and within 360 days    No results found for: PHOS       Failed - Bone Mineral Density or Dexa Scan completed in the last 2 years      Passed - Ca in normal range and within 360 days    Calcium   Date Value Ref Range Status  11/10/2023 9.6 8.6 - 10.4 mg/dL Final         Passed - Vitamin D  in normal range and within 360 days    Vit D, 25-Hydroxy  Date Value Ref Range Status  11/10/2023 55 30 - 100 ng/mL Final    Comment:    Vitamin D  Status         25-OH Vitamin D : . Deficiency:                    <20 ng/mL Insufficiency:             20 - 29 ng/mL Optimal:                 > or = 30 ng/mL . For 25-OH Vitamin D  testing on patients on  D2-supplementation and patients for whom quantitation  of D2 and D3 fractions is required, the QuestAssureD(TM) 25-OH VIT D, (D2,D3), LC/MS/MS is recommended: order  code 07111 (patients >80yrs). . See Note 1 . Note 1 . For additional information, please refer to  http://education.[facility].com/faq/FAQ199  (This link is being provided for informational/ educational purposes only.)          Passed - Cr in normal range and within 360 days    Creat  Date Value Ref Range Status  11/10/2023 0.86 0.60 - 1.00 mg/dL Final         Passed - eGFR is 30 or above and within 360 days    GFR calc Af Amer  Date Value Ref Range Status  01/24/2018 >60 >60 mL/min Final    Comment:    (NOTE) The eGFR has been calculated using the CKD EPI  equation. This calculation has not been validated in all clinical situations. eGFR's persistently <60 mL/min signify possible Chronic Kidney Disease.    GFR, Estimated  Date Value Ref Range Status  06/06/2021 >60 >60 mL/min Final    Comment:    (NOTE) Calculated using the CKD-EPI Creatinine Equation (2021)    eGFR  Date Value Ref Range Status  11/10/2023 72 > OR = 60 mL/min/1.56m2 Final         Passed - Valid encounter within last 12 months    Recent Outpatient Visits           1 month ago Viral upper respiratory tract infection with cough   [place] [facility] [place], Angeline ORN, NP   4 months ago Chest pain, unspecified type   [facility] [place], Angeline ORN, [place]  5 months ago Encounter for general adult medical examination with abnormal findings   [facility] [place], Angeline ORN, [place]

## 2024-03-30 ENCOUNTER — Ambulatory Visit: Admitting: Internal Medicine

## 2024-03-31 ENCOUNTER — Encounter: Payer: Self-pay | Admitting: Internal Medicine

## 2024-03-31 ENCOUNTER — Ambulatory Visit (INDEPENDENT_AMBULATORY_CARE_PROVIDER_SITE_OTHER): Admitting: Internal Medicine

## 2024-03-31 ENCOUNTER — Ambulatory Visit: Admitting: Internal Medicine

## 2024-03-31 VITALS — BP 118/58 | Ht 64.0 in | Wt 160.2 lb

## 2024-03-31 DIAGNOSIS — Z6827 Body mass index (BMI) 27.0-27.9, adult: Secondary | ICD-10-CM

## 2024-03-31 DIAGNOSIS — N3941 Urge incontinence: Secondary | ICD-10-CM | POA: Insufficient documentation

## 2024-03-31 DIAGNOSIS — L4050 Arthropathic psoriasis, unspecified: Secondary | ICD-10-CM | POA: Diagnosis not present

## 2024-03-31 DIAGNOSIS — L409 Psoriasis, unspecified: Secondary | ICD-10-CM | POA: Diagnosis not present

## 2024-03-31 DIAGNOSIS — I10 Essential (primary) hypertension: Secondary | ICD-10-CM

## 2024-03-31 DIAGNOSIS — E663 Overweight: Secondary | ICD-10-CM

## 2024-03-31 DIAGNOSIS — E782 Mixed hyperlipidemia: Secondary | ICD-10-CM

## 2024-03-31 DIAGNOSIS — I83813 Varicose veins of bilateral lower extremities with pain: Secondary | ICD-10-CM

## 2024-03-31 DIAGNOSIS — M150 Primary generalized (osteo)arthritis: Secondary | ICD-10-CM | POA: Diagnosis not present

## 2024-03-31 DIAGNOSIS — M810 Age-related osteoporosis without current pathological fracture: Secondary | ICD-10-CM | POA: Diagnosis not present

## 2024-03-31 LAB — COMPREHENSIVE METABOLIC PANEL WITH GFR
AG Ratio: 1.8 (calc) (ref 1.0–2.5)
ALT: 19 U/L (ref 6–29)
AST: 22 U/L (ref 10–35)
Albumin: 4.7 g/dL (ref 3.6–5.1)
Alkaline phosphatase (APISO): 101 U/L (ref 37–153)
BUN: 15 mg/dL (ref 7–25)
CO2: 28 mmol/L (ref 20–32)
Calcium: 9.8 mg/dL (ref 8.6–10.4)
Chloride: 105 mmol/L (ref 98–110)
Creat: 0.82 mg/dL (ref 0.60–1.00)
Globulin: 2.6 g/dL (ref 1.9–3.7)
Glucose, Bld: 88 mg/dL (ref 65–139)
Potassium: 4.7 mmol/L (ref 3.5–5.3)
Sodium: 141 mmol/L (ref 135–146)
Total Bilirubin: 0.5 mg/dL (ref 0.2–1.2)
Total Protein: 7.3 g/dL (ref 6.1–8.1)
eGFR: 75 mL/min/1.73m2 (ref >=60)

## 2024-03-31 LAB — LIPID PANEL
Cholesterol: 179 mg/dL (ref <200)
HDL: 89 mg/dL (ref >=50)
LDL Cholesterol (Calc): 74 mg/dL
Non-HDL Cholesterol (Calc): 90 mg/dL (ref <130)
Total CHOL/HDL Ratio: 2 (calc) (ref <5.0)
Triglycerides: 80 mg/dL (ref <150)

## 2024-03-31 NOTE — Assessment & Plan Note (Signed)
 Controlled on amlodipine  2.5 mg daily Reinforced DASH diet and exercise for weight loss C-Met today

## 2024-03-31 NOTE — Assessment & Plan Note (Signed)
 Continue alendronate  70 mg weekly, advised her she should not take this for more than 5 years Continue calcium  and vitamin D  OTC Encouraged daily weightbearing exercise

## 2024-03-31 NOTE — Assessment & Plan Note (Signed)
 Continue triamcinolone  0.1% cream BID as needed

## 2024-03-31 NOTE — Assessment & Plan Note (Signed)
 She is taking solifenacin 5 mg daily She has been following with GYN for this

## 2024-03-31 NOTE — Assessment & Plan Note (Signed)
 Continue secukinumab 150 mg monthly She will continue to follow with rheumatology

## 2024-03-31 NOTE — Assessment & Plan Note (Signed)
 No itching, pain or ulceration She will continue to follow with vascular We will monitor

## 2024-03-31 NOTE — Assessment & Plan Note (Signed)
 C-Met and lipid profile today Encouraged her to consume a low-fat diet Continue rosuvastatin  5 mg daily

## 2024-03-31 NOTE — Assessment & Plan Note (Signed)
 Encouraged diet and exercise for weight loss ?

## 2024-03-31 NOTE — Progress Notes (Signed)
 Subjective:    Patient ID: Anita Mathews, female    DOB: 07/27/1950, 73 y.o.   MRN: [id number]  HPI  Patient presents to clinic today for 38-month follow-up of chronic conditions.  HTN: Her BP today is 118/58.  She is taking amlodipine  as prescribed.  ECG from 11/2023 reviewed.  HLD: Her last LDL was 54, triglycerides 93, 10/2023.  She denies myalgias on rosuvastatin .  She tries to consume a low-fat diet.  Psoriatic arthritis/OA: Mainly in her hands. Managed with secukinumab.  She follows with rheumatology.  Osteoporosis: She is taking alendronate  as prescribed. She is taking calcium  and vit D OTC. She tries to get weightbearing exercise.  Bone density from 03/2022 (care everywhere) reviewed.  Psoriasis: Managed with triamcinolone  cream as needed.  She is not currently following with dermatology.  Hemorrhoids: Managed with hydrocortisone  cream as needed.  She does not follow with GI.  Varicose veins: She denies any itching, pain or ulceration.  She is not taking any medication for this at this time.  She does not follow with vascular.  OAB: She reports many urinary urgency with incontinence.  She was recently started on solifenacin.  She has been following with GYN.  Review of Systems     Past Medical History:  Diagnosis Date   Arthritis    Glaucoma    Hyperlipidemia    Hypertension December 2022    Current Outpatient Medications  Medication Sig Dispense Refill   acetaminophen (TYLENOL) 500 MG tablet Take by mouth.     alendronate  (FOSAMAX ) 70 MG tablet TAKE 1 TABLET (70 MG TOTAL) BY MOUTH EVERY 7 DAYS WITH FULL GLASS WATER ON EMPTY STOMACH 12 tablet 0   amLODipine  (NORVASC ) 2.5 MG tablet TAKE 1 TABLET BY MOUTH EVERY DAY 90 tablet 1   Biotin 1 MG CAPS Take 1 capsule by mouth daily.     Cholecalciferol (VITAMIN D -1000 MAX ST) 25 MCG (1000 UT) tablet Take by mouth.     cyanocobalamin  100 MCG tablet      dorzolamide-timolol (COSOPT) 22.3-6.8 MG/ML ophthalmic solution Apply  to eye.     hydrocortisone  2.5 % cream Apply topically 2 (two) times daily. 30 g 1   ketoconazole  (NIZORAL ) 2 % cream Apply 1 Application topically daily. 30 g 0   loratadine (CLARITIN) 10 MG tablet Take 10 mg by mouth daily.     OPTIVE 0.5-0.9 % ophthalmic solution SMARTSIG:1 Drop(s) In Eye(s) PRN     promethazine -dextromethorphan (PROMETHAZINE -DM) 6.25-15 MG/5ML syrup Take 5 mLs by mouth 4 (four) times daily as needed. 118 mL 0   rosuvastatin  (CRESTOR ) 5 MG tablet TAKE 1 TABLET BY MOUTH DAILY 90 tablet 0   Secukinumab (COSENTYX) 150 MG/ML SOSY Inject 150 mg into the skin every 30 (thirty) days.     triamcinolone  cream (KENALOG ) 0.1 % APPLY TOPICALLY TO THE AFFECTED AREA TWICE DAILY FOR 1 TO 2 WEEKS AS NEEDED FOR RASH 30 g 0   No current facility-administered medications for this visit.    No Known Allergies  Family History  Problem Relation Age of Onset   Breast cancer Mother 1   Breast cancer Paternal Grandmother     Social History   Socioeconomic History   Marital status: Divorced    Spouse name: Not on file   Number of children: Not on file   Years of education: Not on file   Highest education level: Doctorate  Occupational History   Not on file  Tobacco Use   Smoking status: Never  Smokeless tobacco: Never  Vaping Use   Vaping status: Never Used  Substance and Sexual Activity   Alcohol use: Yes    Comment: occasional   Drug use: Never   Sexual activity: Not on file  Other Topics Concern   Not on file  Social History Narrative   Not on file   Social Drivers of Health   Financial Resource Strain: Low Risk  (03/24/2024)   Overall Financial Resource Strain (CARDIA)    Difficulty of Paying Living Expenses: Not very hard  Food Insecurity: No Food Insecurity (03/24/2024)   Hunger Vital Sign    Worried About Running Out of Food in the Last Year: Never true    Ran Out of Food in the Last Year: Never true  Transportation Needs: No Transportation Needs (03/24/2024)    PRAPARE - Administrator, Civil Service (Medical): No    Lack of Transportation (Non-Medical): No  Physical Activity: Insufficiently Active (03/24/2024)   Exercise Vital Sign    Days of Exercise per Week: 3 days    Minutes of Exercise per Session: 30 min  Stress: No Stress Concern Present (03/24/2024)   [employer] of Occupational Health - Occupational Stress Questionnaire    Feeling of Stress: Only a little  Social Connections: Socially Isolated (03/24/2024)   Social Connection and Isolation Panel    Frequency of Communication with Friends and Family: Once a week    Frequency of Social Gatherings with Friends and Family: More than three times a week    Attends Religious Services: Patient declined    Database Administrator or Organizations: No    Attends Engineer, Structural: Not on file    Marital Status: Divorced  Intimate Partner Violence: Not At Risk (04/17/2023)   Humiliation, Afraid, Rape, and Kick questionnaire    Fear of Current or Ex-Partner: No    Emotionally Abused: No    Physically Abused: No    Sexually Abused: No     Constitutional: Denies fever, malaise, fatigue, headache or abrupt weight changes.  HEENT: Denies eye pain, eye redness, ear pain, ringing in the ears, wax buildup, runny nose, nasal congestion, bloody nose, or sore throat. Respiratory: Denies difficulty breathing, shortness of breath, cough or sputum production.   Cardiovascular: Denies chest pain, chest tightness, palpitations or swelling in the hands or feet.  Gastrointestinal: Patient reports hemorrhoids.  Denies abdominal pain, bloating, constipation, diarrhea or blood in the stool.  GU: Pt reports urinary urgency. Denies frequency, pain with urination, burning sensation, blood in urine, odor or discharge. Musculoskeletal: Patient reports joint pain, mainly in hands.  Denies decrease in range of motion, difficulty with gait, muscle pain or joint swelling.  Skin: Patient  reports intermittent rash of face and ears.  Denies lesions or ulcercations.  Neurological: Denies dizziness, difficulty with memory, difficulty with speech or problems with balance and coordination.  Psych: Denies anxiety, depression, SI/HI.  No other specific complaints in a complete review of systems (except as listed in HPI above).  Objective:   Physical Exam BP (!) 118/58 (BP Location: Right Arm, Patient Position: Sitting, Cuff Size: Normal)   Ht 5' 4 (1.626 m)   Wt 160 lb 3.2 oz (72.7 kg)   BMI 27.50 kg/m    Wt Readings from Last 3 Encounters:  01/29/24 160 lb (72.6 kg)  11/10/23 161 lb 9.6 oz (73.3 kg)  10/01/23 161 lb (73 kg)    General: Appears her stated age, overweight, in NAD. Skin: Warm, dry  and intact.   HEENT: Head: normal shape and size; Eyes: sclera white, no icterus, conjunctiva pink, PERRLA and EOMs intact;  Cardiovascular: Normal rate and rhythm. S1,S2 noted.  No murmur, rubs or gallops noted. No JVD or BLE edema. No carotid bruits noted. Pulmonary/Chest: Normal effort and positive vesicular breath sounds. No respiratory distress. No wheezes, rales or ronchi noted.  Musculoskeletal: Joint enlargement noted in hands. No joint swelling noted.  No difficulty with gait.  Neurological: Alert and oriented.  Coordination normal.    BMET    Component Value Date/Time   NA 140 11/10/2023 0936   K 4.6 11/10/2023 0936   CL 105 11/10/2023 0936   CO2 28 11/10/2023 0936   GLUCOSE 76 11/10/2023 0936   BUN 13 11/10/2023 0936   CREATININE 0.86 11/10/2023 0936   CALCIUM  9.6 11/10/2023 0936   GFRNONAA >60 06/06/2021 2244   GFRAA >60 01/24/2018 1056    Lipid Panel     Component Value Date/Time   CHOL 154 10/01/2023 1017   TRIG 93 10/01/2023 1017   HDL 82 10/01/2023 1017   CHOLHDL 1.9 10/01/2023 1017   LDLCALC 54 10/01/2023 1017    CBC    Component Value Date/Time   WBC 4.4 11/10/2023 0936   RBC 4.04 11/10/2023 0936   HGB 12.2 11/10/2023 0936   HCT 37.1  11/10/2023 0936   PLT 264 11/10/2023 0936   MCV 91.8 11/10/2023 0936   MCH 30.2 11/10/2023 0936   MCHC 32.9 11/10/2023 0936   RDW 12.7 11/10/2023 0936   LYMPHSABS 2.2 01/24/2018 1056   MONOABS 0.9 01/24/2018 1056   EOSABS 0.1 01/24/2018 1056   BASOSABS 0.1 01/24/2018 1056    Hgb A1C Lab Results  Component Value Date   HGBA1C 5.5 10/01/2023            Assessment & Plan:    RTC in 6 months for your annual exam Angeline Laura, NP

## 2024-03-31 NOTE — Patient Instructions (Signed)

## 2024-04-01 ENCOUNTER — Ambulatory Visit: Payer: Self-pay | Admitting: Internal Medicine

## 2024-04-01 ENCOUNTER — Ambulatory Visit: Admitting: Internal Medicine

## 2024-04-08 ENCOUNTER — Telehealth (INDEPENDENT_AMBULATORY_CARE_PROVIDER_SITE_OTHER): Payer: Self-pay | Admitting: Vascular Surgery

## 2024-04-08 LAB — COMPREHENSIVE METABOLIC PANEL WITH GFR
AG Ratio: 1.9 (calc) (ref 1.0–2.5)
ALT: 15 U/L (ref 6–29)
AST: 18 U/L (ref 10–35)
Albumin: 4.5 g/dL (ref 3.6–5.1)
Alkaline phosphatase (APISO): 78 U/L (ref 37–153)
BUN: 13 mg/dL (ref 7–25)
CO2: 28 mmol/L (ref 20–32)
Calcium: 9.6 mg/dL (ref 8.6–10.4)
Chloride: 105 mmol/L (ref 98–110)
Creat: 0.86 mg/dL (ref 0.60–1.00)
Globulin: 2.4 g/dL (ref 1.9–3.7)
Glucose, Bld: 76 mg/dL (ref 65–139)
Potassium: 4.6 mmol/L (ref 3.5–5.3)
Sodium: 140 mmol/L (ref 135–146)
Total Bilirubin: 0.5 mg/dL (ref 0.2–1.2)
Total Protein: 6.9 g/dL (ref 6.1–8.1)
eGFR: 72 mL/min/1.73m2 (ref >=60)

## 2024-04-08 LAB — VITAMIN D 25 HYDROXY (VIT D DEFICIENCY, FRACTURES): Vit D, 25-Hydroxy: 55 ng/mL (ref 30–100)

## 2024-04-08 LAB — CBC
HCT: 37.1 % (ref 35.0–45.0)
Hemoglobin: 12.2 g/dL (ref 11.7–15.5)
MCH: 30.2 pg (ref 27.0–33.0)
MCHC: 32.9 g/dL (ref 32.0–36.0)
MCV: 91.8 fL (ref 80.0–100.0)
MPV: 10.6 fL (ref 7.5–12.5)
Platelets: 264 Thousand/uL (ref 140–400)
RBC: 4.04 Million/uL (ref 3.80–5.10)
RDW: 12.7 % (ref 11.0–15.0)
WBC: 4.4 Thousand/uL (ref 3.8–10.8)

## 2024-04-08 LAB — VITAMIN B12: Vitamin B-12: 901 pg/mL (ref 200–1100)

## 2024-04-08 LAB — TROPONIN I: Troponin I: 3 ng/L (ref <=47)

## 2024-04-08 LAB — TSH: TSH: 1.46 m[IU]/L (ref 0.40–4.50)

## 2024-04-08 NOTE — Telephone Encounter (Signed)
 Waiting on call from pt who stated per encounter form on 09/24/23 she wanted to wait till Fall to schedule Laser Ablation with Jama Hacker. MD.

## 2024-04-13 ENCOUNTER — Ambulatory Visit

## 2024-04-13 DIAGNOSIS — Z23 Encounter for immunization: Secondary | ICD-10-CM

## 2024-04-13 DIAGNOSIS — L409 Psoriasis, unspecified: Secondary | ICD-10-CM | POA: Diagnosis not present

## 2024-04-13 DIAGNOSIS — Z1211 Encounter for screening for malignant neoplasm of colon: Secondary | ICD-10-CM | POA: Diagnosis not present

## 2024-04-13 DIAGNOSIS — Z7960 Long term (current) use of unspecified immunomodulators and immunosuppressants: Secondary | ICD-10-CM | POA: Diagnosis not present

## 2024-04-13 DIAGNOSIS — L4050 Arthropathic psoriasis, unspecified: Secondary | ICD-10-CM | POA: Diagnosis not present

## 2024-04-13 DIAGNOSIS — M150 Primary generalized (osteo)arthritis: Secondary | ICD-10-CM | POA: Diagnosis not present

## 2024-04-18 DIAGNOSIS — Z1211 Encounter for screening for malignant neoplasm of colon: Secondary | ICD-10-CM | POA: Diagnosis not present

## 2024-04-20 ENCOUNTER — Ambulatory Visit
Admission: RE | Admit: 2024-04-20 | Discharge: 2024-04-20 | Disposition: A | Discharge status: Home / Self Care | Source: Ambulatory Visit | Attending: Internal Medicine | Admitting: Internal Medicine

## 2024-04-20 DIAGNOSIS — Z1231 Encounter for screening mammogram for malignant neoplasm of breast: Secondary | ICD-10-CM | POA: Insufficient documentation

## 2024-04-20 DIAGNOSIS — M85852 Other specified disorders of bone density and structure, left thigh: Secondary | ICD-10-CM | POA: Diagnosis not present

## 2024-04-20 DIAGNOSIS — M85851 Other specified disorders of bone density and structure, right thigh: Secondary | ICD-10-CM | POA: Diagnosis not present

## 2024-04-20 DIAGNOSIS — Z780 Asymptomatic menopausal state: Secondary | ICD-10-CM | POA: Insufficient documentation

## 2024-04-21 ENCOUNTER — Ambulatory Visit: Payer: Self-pay | Admitting: Internal Medicine

## 2024-04-23 ENCOUNTER — Other Ambulatory Visit: Payer: Self-pay | Admitting: Internal Medicine

## 2024-04-23 DIAGNOSIS — E782 Mixed hyperlipidemia: Secondary | ICD-10-CM

## 2024-04-25 NOTE — Telephone Encounter (Signed)
 Requested Prescriptions  Pending Prescriptions Disp Refills   rosuvastatin  (CRESTOR ) 5 MG tablet [Pharmacy Med Name: ROSUVASTATIN  CALCIUM  5 MG TAB] 90 tablet 3    Sig: TAKE 1 TABLET BY MOUTH DAILY     Cardiovascular:  Antilipid - Statins 2 Failed - 04/25/2024  4:04 PM      Failed - Lipid Panel in normal range within the last 12 months    Cholesterol  Date Value Ref Range Status  03/31/2024 179 <200 mg/dL Final   LDL Cholesterol (Calc)  Date Value Ref Range Status  03/31/2024 74 mg/dL (calc) Final    Comment:    Reference range: <100 . Desirable range <100 mg/dL for primary prevention;   <70 mg/dL for patients with CHD or diabetic patients  with > or = 2 CHD risk factors. SABRA LDL-C is now calculated using the Martin-Hopkins  calculation, which is a validated novel method providing  better accuracy than the Friedewald equation in the  estimation of LDL-C.  Gladis APPLETHWAITE et al. SANDREA. 7986;689(80): 2061-2068  (http://education.[facility].com/faq/FAQ164)    HDL  Date Value Ref Range Status  03/31/2024 89 > OR = 50 mg/dL Final   Triglycerides  Date Value Ref Range Status  03/31/2024 80 <150 mg/dL Final         Passed - Cr in normal range and within 360 days    Creat  Date Value Ref Range Status  03/31/2024 0.82 0.60 - 1.00 mg/dL Final         Passed - Patient is not pregnant      Passed - Valid encounter within last 12 months    Recent Outpatient Visits           3 weeks ago Mixed hyperlipidemia   [place] [facility] [place], [place] W, NP   2 months ago Viral upper respiratory tract infection with cough   [place] [facility] [place], Angeline ORN, NP   5 months ago Chest pain, unspecified type   [place] [facility] [place], Angeline ORN, NP   6 months ago Encounter for general adult medical examination with abnormal findings   [place] [facility] [place], Angeline ORN, NP

## 2024-05-27 ENCOUNTER — Ambulatory Visit

## 2024-06-06 ENCOUNTER — Encounter: Payer: Self-pay | Admitting: Emergency Medicine

## 2024-06-08 ENCOUNTER — Ambulatory Visit

## 2024-06-11 ENCOUNTER — Other Ambulatory Visit: Payer: Self-pay | Admitting: Internal Medicine

## 2024-06-13 NOTE — Telephone Encounter (Signed)
 Requested Prescriptions  Pending Prescriptions Disp Refills   amLODipine  (NORVASC ) 2.5 MG tablet [Pharmacy Med Name: AMLODIPINE  BESYLATE 2.5 MG TAB] 90 tablet 0    Sig: TAKE 1 TABLET BY MOUTH EVERY DAY     Cardiovascular: Calcium  Channel Blockers 2 Passed - 06/13/2024  3:53 PM      Passed - Last BP in normal range    BP Readings from Last 1 Encounters:  03/31/24 (!) 118/58         Passed - Last Heart Rate in normal range    Pulse Readings from Last 1 Encounters:  01/29/24 (!) 53         Passed - Valid encounter within last 6 months    Recent Outpatient Visits           2 months ago Mixed hyperlipidemia   [place] [facility] [place], [place] W, NP   4 months ago Viral upper respiratory tract infection with cough   [place] [facility] [place], Angeline ORN, NP   7 months ago Chest pain, unspecified type   [place] [facility] [place], Angeline ORN, NP   8 months ago Encounter for general adult medical examination with abnormal findings   [place] [facility] [place], Angeline ORN, NP

## 2024-06-26 ENCOUNTER — Other Ambulatory Visit: Payer: Self-pay | Admitting: Internal Medicine

## 2024-06-27 NOTE — Telephone Encounter (Signed)
 Requested Prescriptions  Pending Prescriptions Disp Refills   alendronate  (FOSAMAX ) 70 MG tablet [Pharmacy Med Name: ALENDRONATE  SODIUM 70 MG TAB] 12 tablet 0    Sig: TAKE 1 TABLET (70 MG TOTAL) BY MOUTH EVERY 7 DAYS WITH FULL GLASS WATER ON EMPTY STOMACH     Endocrinology:  Bisphosphonates Failed - 06/27/2024  3:40 PM      Failed - Mg Level in normal range and within 360 days    Magnesium  Date Value Ref Range Status  05/30/2021 2.2 1.5 - 2.5 mg/dL Final         Failed - Phosphate in normal range and within 360 days    No results found for: PHOS       Passed - Ca in normal range and within 360 days    Calcium   Date Value Ref Range Status  03/31/2024 9.8 8.6 - 10.4 mg/dL Final         Passed - Vitamin D  in normal range and within 360 days    Vit D, 25-Hydroxy  Date Value Ref Range Status  11/10/2023 55 30 - 100 ng/mL Final    Comment:    Vitamin D  Status         25-OH Vitamin D : . Deficiency:                    <20 ng/mL Insufficiency:             20 - 29 ng/mL Optimal:                 > or = 30 ng/mL . For 25-OH Vitamin D  testing on patients on  D2-supplementation and patients for whom quantitation  of D2 and D3 fractions is required, the [facility](TM) 25-OH VIT D, (D2,D3), LC/MS/MS is recommended: order  code 07111 (patients >30yrs). . See Note 1 . Note 1 . For additional information, please refer to  http://education.[facility].com/faq/FAQ199  (This link is being provided for informational/ educational purposes only.)          Passed - Cr in normal range and within 360 days    Creat  Date Value Ref Range Status  03/31/2024 0.82 0.60 - 1.00 mg/dL Final         Passed - eGFR is 30 or above and within 360 days    GFR calc Af Amer  Date Value Ref Range Status  01/24/2018 >60 >60 mL/min Final    Comment:    (NOTE) The eGFR has been calculated using the CKD EPI equation. This calculation has not been validated in all clinical situations. eGFR's  persistently <60 mL/min signify possible Chronic Kidney Disease.    GFR, Estimated  Date Value Ref Range Status  06/06/2021 >60 >60 mL/min Final    Comment:    (NOTE) Calculated using the CKD-EPI Creatinine Equation (2021)    eGFR  Date Value Ref Range Status  03/31/2024 75 > OR = 60 mL/min/1.30m2 Final         Passed - Valid encounter within last 12 months    Recent Outpatient Visits           2 months ago Mixed hyperlipidemia   [place] [facility] [place], [place] W, NP   5 months ago Viral upper respiratory tract infection with cough   [place] [facility] [place], Angeline ORN, NP   7 months ago Chest pain, unspecified type   [facility] [place], Angeline ORN, [place]  9 months ago Encounter for general adult medical examination with abnormal findings   [place] [facility] [place], Angeline ORN, NP              Passed - Bone Mineral Density or Dexa Scan completed in the last 2 years

## 2024-06-30 ENCOUNTER — Ambulatory Visit: Admitting: Internal Medicine

## 2024-06-30 ENCOUNTER — Encounter: Payer: Self-pay | Admitting: Internal Medicine

## 2024-06-30 VITALS — BP 120/60 | HR 61 | Ht 64.0 in | Wt 160.2 lb

## 2024-06-30 DIAGNOSIS — L2082 Flexural eczema: Secondary | ICD-10-CM | POA: Diagnosis not present

## 2024-06-30 MED ORDER — TRIAMCINOLONE ACETONIDE 0.1 % EX CREA: 1.0000 | TOPICAL_CREAM | Freq: Two times a day (BID) | CUTANEOUS | 0 refills | Status: AC

## 2024-06-30 MED ORDER — MUPIROCIN 2 % EX OINT: 1.0000 | TOPICAL_OINTMENT | Freq: Two times a day (BID) | CUTANEOUS | 0 refills | Status: AC

## 2024-06-30 NOTE — Progress Notes (Signed)
 "  Subjective:    Patient ID: Anita Mathews, female    DOB: Jun 14, 1950, 74 y.o.   MRN: [id number]  HPI  Discussed the use of AI scribe software for clinical note transcription with the patient, who gave verbal consent to proceed.   Anita Mathews is a 74 year old female who presents with nasal irritation.  She has been experiencing nasal irritation both inside and outside her left nostril for a couple of weeks. The sensation is described as itching and similar to having a cut or opening. Initially, she used over-the-counter antibiotic ointment, which provided some relief, but the symptoms recurred.  No nasal bleeding or congestion. She had a cold over the weekend with a runny nose and eyes, but the nasal irritation started before the cold. The outside of her nose feels rough but does not get flaky. The area is painful to touch, especially when it dries out.  She has never experienced this issue before and wonders if it could be related to her psoriatic arthritis, as she does get patches, though not in this location.       Review of Systems     Past Medical History:  Diagnosis Date   Arthritis    Glaucoma    Hyperlipidemia    Hypertension December 2022    Current Outpatient Medications  Medication Sig Dispense Refill   acetaminophen (TYLENOL) 500 MG tablet Take by mouth.     alendronate  (FOSAMAX ) 70 MG tablet TAKE 1 TABLET (70 MG TOTAL) BY MOUTH EVERY 7 DAYS WITH FULL GLASS WATER ON EMPTY STOMACH 12 tablet 0   amLODipine  (NORVASC ) 2.5 MG tablet TAKE 1 TABLET BY MOUTH EVERY DAY 90 tablet 0   Biotin 1 MG CAPS Take 1 capsule by mouth daily.     Cholecalciferol (VITAMIN D -1000 MAX ST) 25 MCG (1000 UT) tablet Take by mouth.     cyanocobalamin  100 MCG tablet      dorzolamide-timolol (COSOPT) 22.3-6.8 MG/ML ophthalmic solution Apply to eye.     hydrocortisone  2.5 % cream Apply topically 2 (two) times daily. 30 g 1   ketoconazole  (NIZORAL ) 2 % cream Apply 1 Application topically  daily. 30 g 0   latanoprost (XALATAN) 0.005 % ophthalmic solution 1 drop at bedtime.     OPTIVE 0.5-0.9 % ophthalmic solution SMARTSIG:1 Drop(s) In Eye(s) PRN     rosuvastatin  (CRESTOR ) 5 MG tablet TAKE 1 TABLET BY MOUTH DAILY 90 tablet 3   Secukinumab (COSENTYX) 150 MG/ML SOSY Inject 150 mg into the skin every 30 (thirty) days.     solifenacin (VESICARE) 5 MG tablet Take 5 mg by mouth.     triamcinolone  cream (KENALOG ) 0.1 % APPLY TOPICALLY TO THE AFFECTED AREA TWICE DAILY FOR 1 TO 2 WEEKS AS NEEDED FOR RASH 30 g 0   No current facility-administered medications for this visit.    No Known Allergies  Family History  Problem Relation Age of Onset   Breast cancer Mother 65   Breast cancer Paternal Grandmother     Social History   Socioeconomic History   Marital status: Divorced    Spouse name: Not on file   Number of children: Not on file   Years of education: Not on file   Highest education level: Doctorate  Occupational History   Not on file  Tobacco Use   Smoking status: Never   Smokeless tobacco: Never  Vaping Use   Vaping status: Never Used  Substance and Sexual Activity   Alcohol use:  Yes    Comment: occasional   Drug use: Never   Sexual activity: Not on file  Other Topics Concern   Not on file  Social History Narrative   Not on file   Social Drivers of Health   Tobacco Use: Low Risk  (04/13/2024)   Received from [facility]   Patient History    Smoking Tobacco Use: Never    Smokeless Tobacco Use: Never    Passive Exposure: Never  Financial Resource Strain: Low Risk (03/24/2024)   Overall Financial Resource Strain (CARDIA)    Difficulty of Paying Living Expenses: Not very hard  Food Insecurity: No Food Insecurity (03/24/2024)   Epic    Worried About Radiation Protection Practitioner of Food in the Last Year: Never true    Ran Out of Food in the Last Year: Never true  Transportation Needs: No Transportation Needs (03/24/2024)   Epic    Lack of  Transportation (Medical): No    Lack of Transportation (Non-Medical): No  Physical Activity: Insufficiently Active (03/24/2024)   Exercise Vital Sign    Days of Exercise per Week: 3 days    Minutes of Exercise per Session: 30 min  Stress: No Stress Concern Present (03/24/2024)   [employer] of Occupational Health - Occupational Stress Questionnaire    Feeling of Stress: Only a little  Social Connections: Socially Isolated (03/24/2024)   Social Connection and Isolation Panel    Frequency of Communication with Friends and Family: Once a week    Frequency of Social Gatherings with Friends and Family: More than three times a week    Attends Religious Services: Patient declined    Database Administrator or Organizations: No    Attends Engineer, Structural: Not on file    Marital Status: Divorced  Intimate Partner Violence: Not At Risk (04/17/2023)   Humiliation, Afraid, Rape, and Kick questionnaire    Fear of Current or Ex-Partner: No    Emotionally Abused: No    Physically Abused: No    Sexually Abused: No  Depression (PHQ2-9): Low Risk (03/31/2024)   Depression (PHQ2-9)    PHQ-2 Score: 0  Alcohol Screen: Low Risk (03/24/2024)   Alcohol Screen    Last Alcohol Screening Score (AUDIT): 3  Housing: Low Risk  (04/13/2024)   Received from [facility]   Epic    In the last 12 months, was there a time when you were not able to pay the mortgage or rent on time?: No    In the past 12 months, how many times have you moved where you were living?: 0    At any time in the past 12 months, were you homeless or living in a shelter (including now)?: No  Utilities: Not At Risk (03/16/2024)   Received from [facility]   Epic    In the past 12 months has the electric, gas, oil, or water company threatened to shut off services in your home?: No  Health Literacy: Adequate Health Literacy (04/17/2023)   B1300 Health Literacy    Frequency of need for  help with medical instructions: Never     Constitutional: Denies fever, malaise, fatigue, headache or abrupt weight changes.  HEENT: Denies eye pain, eye redness, ear pain, ringing in the ears, wax buildup, runny nose, nasal congestion, bloody nose, or sore throat. Respiratory: Denies difficulty breathing, shortness of breath, cough or sputum production.   Cardiovascular: Denies chest pain, chest tightness, palpitations or swelling in the hands  or feet.  Gastrointestinal: Patient reports hemorrhoids.  Denies abdominal pain, bloating, constipation, diarrhea or blood in the stool.  GU: Pt reports urinary urgency. Denies frequency, pain with urination, burning sensation, blood in urine, odor or discharge. Musculoskeletal: Patient reports joint pain, mainly in hands.  Denies decrease in range of motion, difficulty with gait, muscle pain or joint swelling.  Skin: Patient reports rash of nose.  Denies lesions or ulcercations.  Neurological: Denies dizziness, difficulty with memory, difficulty with speech or problems with balance and coordination.  Psych: Denies anxiety, depression, SI/HI.  No other specific complaints in a complete review of systems (except as listed in HPI above).  Objective:   Physical Exam BP 120/60 (BP Location: Right Arm, Patient Position: Sitting)   Pulse 61   Ht 5' 4 (1.626 m)   Wt 160 lb 4 oz (72.7 kg)   SpO2 97%   BMI 27.51 kg/m     Wt Readings from Last 3 Encounters:  03/31/24 160 lb 3.2 oz (72.7 kg)  01/29/24 160 lb (72.6 kg)  11/10/23 161 lb 9.6 oz (73.3 kg)    General: Appears her stated age, overweight, in NAD. Skin: Warm, dry and intact. Redness, swelling noted of the left nare, internally and at the flexure of the upper lip and cheek. HEENT: Head: normal shape and size; Eyes: sclera white, no icterus, conjunctiva pink, PERRLA and EOMs intact;  Cardiovascular: Normal rate. Pulmonary/Chest: Normal effort. No respiratory distress.  Neurological: Alert  and oriented.     BMET    Component Value Date/Time   NA 141 03/31/2024 1015   K 4.7 03/31/2024 1015   CL 105 03/31/2024 1015   CO2 28 03/31/2024 1015   GLUCOSE 88 03/31/2024 1015   BUN 15 03/31/2024 1015   CREATININE 0.82 03/31/2024 1015   CALCIUM  9.8 03/31/2024 1015   GFRNONAA >60 06/06/2021 2244   GFRAA >60 01/24/2018 1056    Lipid Panel     Component Value Date/Time   CHOL 179 03/31/2024 1015   TRIG 80 03/31/2024 1015   HDL 89 03/31/2024 1015   CHOLHDL 2.0 03/31/2024 1015   LDLCALC 74 03/31/2024 1015    CBC    Component Value Date/Time   WBC 4.4 11/10/2023 0936   RBC 4.04 11/10/2023 0936   HGB 12.2 11/10/2023 0936   HCT 37.1 11/10/2023 0936   PLT 264 11/10/2023 0936   MCV 91.8 11/10/2023 0936   MCH 30.2 11/10/2023 0936   MCHC 32.9 11/10/2023 0936   RDW 12.7 11/10/2023 0936   LYMPHSABS 2.2 01/24/2018 1056   MONOABS 0.9 01/24/2018 1056   EOSABS 0.1 01/24/2018 1056   BASOSABS 0.1 01/24/2018 1056    Hgb A1C Lab Results  Component Value Date   HGBA1C 5.5 10/01/2023            Assessment & Plan:  Assessment and Plan    Nasal and perinasal eczema Chronic eczema with irritation, itching, and fissures, worsened by dry skin. No signs of psoriatic involvement. - Prescribed triamcinolone  1% cream for irritation and swelling. - Prescribed mupirocin  2% cream. - Instructed to mix triamcinolone  and mupirocin  on a Q-tip and apply twice daily. - Advised to avoid touching or picking the area. - Recommended lukewarm water for face washing, avoid rubbing. - Educated on potential recurrence in winter, treatment duration 1-2 weeks.        RTC in 3 months for your annual exam Angeline Laura, NP  "

## 2024-10-06 ENCOUNTER — Encounter: Admitting: Internal Medicine
